# Patient Record
Sex: Male | Born: 1935 | Race: White | Hispanic: No | Marital: Married | Smoking: Former smoker
Health system: Southern US, Community
[De-identification: ages and names within clinical notes are randomized; demographics above are authoritative.]

## PROBLEM LIST (undated history)

## (undated) DIAGNOSIS — B379 Candidiasis, unspecified: Secondary | ICD-10-CM

## (undated) DIAGNOSIS — Z950 Presence of cardiac pacemaker: Secondary | ICD-10-CM

## (undated) DIAGNOSIS — R51 Headache: Secondary | ICD-10-CM

## (undated) DIAGNOSIS — R7303 Prediabetes: Secondary | ICD-10-CM

## (undated) DIAGNOSIS — D649 Anemia, unspecified: Secondary | ICD-10-CM

## (undated) DIAGNOSIS — L409 Psoriasis, unspecified: Secondary | ICD-10-CM

## (undated) DIAGNOSIS — R42 Dizziness and giddiness: Secondary | ICD-10-CM

## (undated) DIAGNOSIS — I10 Essential (primary) hypertension: Secondary | ICD-10-CM

## (undated) DIAGNOSIS — R0600 Dyspnea, unspecified: Secondary | ICD-10-CM

## (undated) DIAGNOSIS — G2581 Restless legs syndrome: Secondary | ICD-10-CM

## (undated) DIAGNOSIS — Z8719 Personal history of other diseases of the digestive system: Secondary | ICD-10-CM

## (undated) DIAGNOSIS — M1990 Unspecified osteoarthritis, unspecified site: Secondary | ICD-10-CM

## (undated) DIAGNOSIS — K219 Gastro-esophageal reflux disease without esophagitis: Secondary | ICD-10-CM

## (undated) DIAGNOSIS — I2510 Atherosclerotic heart disease of native coronary artery without angina pectoris: Secondary | ICD-10-CM

## (undated) DIAGNOSIS — J189 Pneumonia, unspecified organism: Secondary | ICD-10-CM

## (undated) DIAGNOSIS — R519 Headache, unspecified: Secondary | ICD-10-CM

## (undated) DIAGNOSIS — J449 Chronic obstructive pulmonary disease, unspecified: Secondary | ICD-10-CM

## (undated) DIAGNOSIS — Z87442 Personal history of urinary calculi: Secondary | ICD-10-CM

## (undated) DIAGNOSIS — I499 Cardiac arrhythmia, unspecified: Secondary | ICD-10-CM

## (undated) DIAGNOSIS — N400 Enlarged prostate without lower urinary tract symptoms: Secondary | ICD-10-CM

## (undated) DIAGNOSIS — F419 Anxiety disorder, unspecified: Secondary | ICD-10-CM

## (undated) DIAGNOSIS — T884XXA Failed or difficult intubation, initial encounter: Secondary | ICD-10-CM

## (undated) DIAGNOSIS — N189 Chronic kidney disease, unspecified: Secondary | ICD-10-CM

## (undated) HISTORY — PX: INSERT / REPLACE / REMOVE PACEMAKER: SUR710

## (undated) HISTORY — PX: TRANSURETHRAL RESECTION OF PROSTATE: SHX73

## (undated) HISTORY — PX: HERNIA REPAIR: SHX51

---
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
## 1990-08-05 DIAGNOSIS — R42 Dizziness and giddiness: Secondary | ICD-10-CM

## 1990-08-05 HISTORY — DX: Dizziness and giddiness: R42

## 1999-01-04 ENCOUNTER — Ambulatory Visit (HOSPITAL_COMMUNITY): Admission: RE | Admit: 1999-01-04 | Discharge: 1999-01-04 | Payer: Self-pay | Admitting: Neurosurgery

## 1999-01-04 ENCOUNTER — Encounter: Payer: Self-pay | Admitting: Neurosurgery

## 2005-06-04 ENCOUNTER — Ambulatory Visit: Payer: Self-pay | Admitting: Internal Medicine

## 2005-07-23 ENCOUNTER — Other Ambulatory Visit: Payer: Self-pay

## 2005-07-23 ENCOUNTER — Ambulatory Visit: Payer: Self-pay | Admitting: Urology

## 2005-07-31 ENCOUNTER — Ambulatory Visit: Payer: Self-pay | Admitting: Urology

## 2006-08-13 ENCOUNTER — Other Ambulatory Visit: Payer: Self-pay

## 2006-08-13 ENCOUNTER — Ambulatory Visit: Payer: Self-pay | Admitting: Urology

## 2006-08-20 ENCOUNTER — Ambulatory Visit: Payer: Self-pay | Admitting: Urology

## 2006-08-20 ENCOUNTER — Emergency Department: Payer: Self-pay | Admitting: Unknown Physician Specialty

## 2006-08-24 ENCOUNTER — Emergency Department: Payer: Self-pay

## 2006-09-10 ENCOUNTER — Ambulatory Visit: Payer: Self-pay | Admitting: Urology

## 2006-09-10 ENCOUNTER — Other Ambulatory Visit: Payer: Self-pay

## 2007-11-05 ENCOUNTER — Ambulatory Visit: Payer: Self-pay | Admitting: Unknown Physician Specialty

## 2008-11-08 ENCOUNTER — Ambulatory Visit: Payer: Self-pay | Admitting: Urology

## 2008-12-05 ENCOUNTER — Ambulatory Visit: Payer: Self-pay | Admitting: Urology

## 2009-06-13 ENCOUNTER — Ambulatory Visit: Payer: Self-pay | Admitting: Urology

## 2009-10-06 ENCOUNTER — Ambulatory Visit: Payer: Self-pay | Admitting: Unknown Physician Specialty

## 2010-08-05 HISTORY — PX: JOINT REPLACEMENT: SHX530

## 2010-08-05 HISTORY — PX: PARTIAL KNEE ARTHROPLASTY: SHX2174

## 2011-06-19 ENCOUNTER — Ambulatory Visit: Payer: Self-pay | Admitting: Unknown Physician Specialty

## 2011-07-29 ENCOUNTER — Ambulatory Visit: Payer: Self-pay | Admitting: Unknown Physician Specialty

## 2012-12-11 ENCOUNTER — Emergency Department: Payer: Self-pay | Admitting: Unknown Physician Specialty

## 2012-12-22 ENCOUNTER — Ambulatory Visit: Payer: Self-pay | Admitting: Cardiology

## 2012-12-22 LAB — PROTIME-INR
INR: 1.1
Prothrombin Time: 14.2 secs (ref 11.5–14.7)

## 2013-10-29 DIAGNOSIS — N50819 Testicular pain, unspecified: Secondary | ICD-10-CM | POA: Insufficient documentation

## 2013-11-02 ENCOUNTER — Ambulatory Visit: Payer: Self-pay | Admitting: Urology

## 2014-08-05 DIAGNOSIS — I499 Cardiac arrhythmia, unspecified: Secondary | ICD-10-CM

## 2014-08-05 HISTORY — DX: Cardiac arrhythmia, unspecified: I49.9

## 2014-11-08 DIAGNOSIS — D649 Anemia, unspecified: Secondary | ICD-10-CM | POA: Insufficient documentation

## 2014-12-05 ENCOUNTER — Other Ambulatory Visit: Payer: Self-pay | Admitting: Cardiology

## 2014-12-13 ENCOUNTER — Ambulatory Visit: Admit: 2014-12-13 | Payer: Self-pay | Admitting: Cardiology

## 2014-12-13 SURGERY — CARDIOVERSION (CATH LAB)
Anesthesia: Moderate Sedation

## 2014-12-22 ENCOUNTER — Encounter: Payer: Self-pay | Admitting: Cardiology

## 2014-12-22 ENCOUNTER — Ambulatory Visit: Payer: Medicare Other | Admitting: Anesthesiology

## 2014-12-22 ENCOUNTER — Ambulatory Visit
Admission: RE | Admit: 2014-12-22 | Discharge: 2014-12-22 | Disposition: A | Discharge status: Home / Self Care | Payer: Medicare Other | Source: Ambulatory Visit | Attending: Cardiology | Admitting: Cardiology

## 2014-12-22 ENCOUNTER — Encounter
Admission: RE | Disposition: A | Discharge status: Home / Self Care | Payer: Self-pay | Source: Ambulatory Visit | Attending: Cardiology

## 2014-12-22 DIAGNOSIS — N400 Enlarged prostate without lower urinary tract symptoms: Secondary | ICD-10-CM | POA: Insufficient documentation

## 2014-12-22 DIAGNOSIS — K219 Gastro-esophageal reflux disease without esophagitis: Secondary | ICD-10-CM | POA: Insufficient documentation

## 2014-12-22 DIAGNOSIS — R5383 Other fatigue: Secondary | ICD-10-CM | POA: Diagnosis not present

## 2014-12-22 DIAGNOSIS — I480 Paroxysmal atrial fibrillation: Secondary | ICD-10-CM | POA: Diagnosis not present

## 2014-12-22 DIAGNOSIS — Z825 Family history of asthma and other chronic lower respiratory diseases: Secondary | ICD-10-CM | POA: Insufficient documentation

## 2014-12-22 DIAGNOSIS — Z8249 Family history of ischemic heart disease and other diseases of the circulatory system: Secondary | ICD-10-CM | POA: Diagnosis not present

## 2014-12-22 DIAGNOSIS — I2510 Atherosclerotic heart disease of native coronary artery without angina pectoris: Secondary | ICD-10-CM | POA: Insufficient documentation

## 2014-12-22 DIAGNOSIS — Z8489 Family history of other specified conditions: Secondary | ICD-10-CM | POA: Diagnosis not present

## 2014-12-22 DIAGNOSIS — I4891 Unspecified atrial fibrillation: Secondary | ICD-10-CM | POA: Insufficient documentation

## 2014-12-22 DIAGNOSIS — M1990 Unspecified osteoarthritis, unspecified site: Secondary | ICD-10-CM | POA: Insufficient documentation

## 2014-12-22 DIAGNOSIS — Z87891 Personal history of nicotine dependence: Secondary | ICD-10-CM | POA: Insufficient documentation

## 2014-12-22 DIAGNOSIS — I10 Essential (primary) hypertension: Secondary | ICD-10-CM | POA: Diagnosis not present

## 2014-12-22 HISTORY — DX: Unspecified osteoarthritis, unspecified site: M19.90

## 2014-12-22 HISTORY — DX: Atherosclerotic heart disease of native coronary artery without angina pectoris: I25.10

## 2014-12-22 HISTORY — DX: Benign prostatic hyperplasia without lower urinary tract symptoms: N40.0

## 2014-12-22 HISTORY — PX: ELECTROPHYSIOLOGIC STUDY: SHX172A

## 2014-12-22 HISTORY — DX: Gastro-esophageal reflux disease without esophagitis: K21.9

## 2014-12-22 HISTORY — DX: Anemia, unspecified: D64.9

## 2014-12-22 HISTORY — DX: Essential (primary) hypertension: I10

## 2014-12-22 HISTORY — DX: Cardiac arrhythmia, unspecified: I49.9

## 2014-12-22 SURGERY — CARDIOVERSION (CATH LAB)
Anesthesia: General

## 2014-12-22 MED ORDER — PROPOFOL 10 MG/ML IV BOLUS
INTRAVENOUS | Status: DC | PRN
  Administered 2014-12-22: 60 mg via INTRAVENOUS

## 2014-12-22 NOTE — Anesthesia Postprocedure Evaluation (Signed)
  Anesthesia Post-op Note  Patient: Joseph Houston  Procedure(s) Performed: Procedure(s): CARDIOVERSION (N/A)  Anesthesia type:General  Patient location: PACU  Post pain: Pain level controlled  Post assessment: Post-op Vital signs reviewed, Patient's Cardiovascular Status Stable, Respiratory Function Stable, Patent Airway and No signs of Nausea or vomiting  Post vital signs: Reviewed and stable  Last Vitals:  Filed Vitals:   12/22/14 0737  BP: 131/74  Pulse: 60  Temp: 36.1 C  Resp: 12    Level of consciousness: awake, alert  and patient cooperative  Complications: No apparent anesthesia complications

## 2014-12-22 NOTE — OR Nursing (Signed)
Pt in [facility] for Cardioversion,outpatient. Anesthesia present for case  And transfer of care for the procedure started at: 725. All VS and  Assessment during procedure refer to anesthesia record. Care transfer back to [facility] at:0728

## 2014-12-22 NOTE — Anesthesia Procedure Notes (Signed)
Performed by: Aline Brochure Pre-anesthesia Checklist: Timeout performed Oxygen Delivery Method: Nasal cannula

## 2014-12-22 NOTE — H&P (Signed)
Chief Complaint: Chief Complaint  Patient presents with  . Follow-up  2 weeks  . Fatigue  Im just so tired I cant hardly do anything  Date of Service: 12/02/2014 Date of Birth: [birth year] PCP: Azzie Glatter, MD  History of Present Illness: Mr. Bink is a 79 y.o.male patient who returns for follow-up visit. Has had some weakness and fatigue. He had been treated for atrial fibrillation with flecainide at 100 mg twice daily as well as beta-blocker. His rate is being controlled. He is hemodynamically stable at present. He complains of generalize weakness and fatigue over the last 2 weeks. Holter monitor reveals atrial fibrillation with controlled rate. He is very symptomatic. Will consider proceeding with elective cardioversion after 4 weeks of anticoagulation with apixaban. Past Medical and Surgical History  Past Medical History Past Medical History  Diagnosis Date  . BPH (benign prostatic hypertrophy)  . CAD (coronary artery disease)  . Normal cardiac stress test 08/2011  . Atrial fibrillation  . GERD (gastroesophageal reflux disease) 2000  . Hypertension  . 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   Medications and Allergies  Current Medications  Current Outpatient Prescriptions  Medication Sig Dispense Refill  . acetaminophen (TYLENOL) 325 MG tablet Take 650 mg by mouth every 4 (four) hours as needed for Pain.  [name] apixaban (ELIQUIS) 5 mg tablet Take 1 tablet (5 mg total) by mouth 2 (two) times daily. 60 tablet 11  . aspirin 81 MG chewable tablet Take 81 mg by mouth once daily.  [name] azelastine (ASTEPRO) 0.15 % (205.5 mcg) nasal spray Place 2 sprays into both nostrils 2 (two) times daily as needed for Rhinitis.  .  cyanocobalamin (VITAMIN B12) 1000 MCG tablet Take 1,000 mcg by mouth once daily.  [name] EPINEPHrine (EPIPEN 2-PAK) 0.3 mg/0.3 mL pen injector  . flecainide (TAMBOCOR) 100 MG tablet Take 1 tablet (100 mg total) by mouth 2 (two) times daily. 60 tablet 5  . fluticasone (FLONASE) 50 mcg/actuation nasal spray Place 2 sprays into both nostrils once daily.  [name] losartan (COZAAR) 100 MG tablet Take 1 tablet (100 mg total) by mouth once daily. 30 tablet 5  . metoprolol succinate (TOPROL-XL) 25 MG XL tablet Take 1 tablet (25 mg total) by mouth once daily. 30 tablet 5  . omega-3 fatty acids-vitamin E (FISH OIL) 1,000 mg Take 3 capsules by mouth once daily.  [name] omega-3 fatty acids/fish oil 340-1,000 mg capsule Take 1 capsule by mouth 2 (two) times daily.  [name] omeprazole (PRILOSEC) 20 MG DR capsule Take 1 capsule (20 mg total) by mouth 2 (two) times daily. 60 capsule 5  . predniSONE (DELTASONE) 10 MG tablet Take 10 mg by mouth once daily.  . traMADol (ULTRAM) 50 mg tablet 1 Tab po bid 60 tablet 2   No current facility-administered medications for this visit.   Allergies: Codeine sulfate  Social and Family History  Social History reports that he quit smoking about 49 years ago. He has never used smokeless tobacco. He reports that he does not drink alcohol.  Family History Family History  Problem Relation Age of Onset  . Alzheimer's disease Mother  . Heart attack Brother  . Allergies Other  Sibling  . Asthma Other  Sibling  . No Known Problems Father   Review of Systems  Review of Systems  Constitutional: Positive for malaise/fatigue. Negative for fever, chills, weight loss and diaphoresis.  HENT: Negative for congestion, ear discharge, hearing loss and tinnitus.  Eyes: Negative for blurred vision.  Respiratory: Negative for cough, hemoptysis, sputum production, shortness of breath and wheezing.  Cardiovascular: Negative for chest pain, palpitations, orthopnea, claudication, leg swelling and PND.   Gastrointestinal: Negative for heartburn, nausea, vomiting, abdominal pain, diarrhea, constipation, blood in stool and melena.  Genitourinary: Negative for dysuria, urgency, frequency and hematuria.  Musculoskeletal: Negative for myalgias, back pain, joint pain and falls.  Skin: Negative for itching and rash.  Neurological: Positive for weakness. Negative for dizziness, tingling, focal weakness, loss of consciousness and headaches.  Endo/Heme/Allergies: Negative for polydipsia. Does not bruise/bleed easily.  Psychiatric/Behavioral: Negative for depression, memory loss and substance abuse. The patient is not nervous/anxious.    Physical Examination   Vitals:BP 132/80 mmHg  Pulse 62  Resp 10  Ht 180.3 cm (5\' 11" )  Wt 117.482 kg (259 lb)  BMI 36.14 kg/m2 Ht:180.3 cm (5\' 11" ) Wt:117.482 kg (259 lb) UGQ:BVQX surface area is 2.43 meters squared. Body mass index is 36.14 kg/(m^2).  Wt Readings from Last 3 Encounters:  12/02/14 117.482 kg (259 lb)  11/18/14 118.752 kg (261 lb 12.8 oz)  11/08/14 119.296 kg (263 lb)   BP Readings from Last 3 Encounters:  12/02/14 132/80  11/18/14 134/80  11/08/14 144/90   General appearance appears in no acute distress  Head Mouth and Eye exam Normocephalic, without obvious abnormality, atraumatic Dentition is good Eyes appear anicteric   Neck exam Thyroid: normal  Nodes: no obvious adenopathy  LUNGS Breath Sounds: Normal Percussion: Normal  CARDIOVASCULAR JVP CV wave: no HJR: no Elevation at 90 degrees: None Carotid Pulse: normal pulsation bilaterally Bruit: None Apex: apical impulse normal  Auscultation Rhythm: atrial fibrillation S1: normal S2: normal Clicks: no Rub: no Murmurs: no murmurs  Gallop: None ABDOMEN Liver enlargement: no Pulsatile aorta: no Ascites: no Bruits: no  EXTREMITIES Clubbing: no Edema: 1+ bilateral pedal edema Pulses: peripheral pulses symmetrical Femoral Bruits: no Amputation:  no SKIN Rash: no Cyanosis: no Embolic phemonenon: no Bruising: no NEURO Alert and Oriented to person, place and time: yes Non focal: yes  PSYCH: Pt appears to have normal affect  LABS REVIEWED Last 3 CBC results: Lab Results  Component Value Date  WBC 6.9 11/08/2014  WBC 8.5 04/25/2014   Lab Results  Component Value Date  HGB 14.5 11/08/2014  HGB 12.7* 04/25/2014   Lab Results  Component Value Date  HCT 44.9 11/08/2014  HCT 39.0* 04/25/2014   Lab Results  Component Value Date  PLT 192 11/08/2014  PLT 205 04/25/2014   Lab Results  Component Value Date  CREATININE 1.3 12/02/2014  BUN 26* 12/02/2014  NA 140 12/02/2014  K 5.2* 12/02/2014  CL 104 12/02/2014  CO2 29.1 12/02/2014   Lab Results  Component Value Date  HGBA1C 6.6* 11/08/2014   Lab Results  Component Value Date  HDL 40.3 04/25/2014   Lab Results  Component Value Date  LDLCALC 100 04/25/2014   Lab Results  Component Value Date  TRIG 81 04/25/2014   Lab Results  Component Value Date  ALT 18 11/08/2014  AST 18 11/08/2014  ALKPHOS 59 11/08/2014   Lab Results  Component Value Date  TSH 0.894 11/08/2014   Diagnostic Studies Reviewed:  EKG EKG demonstrated nonspecific ST and T waves changes, atrial fibrillation, rate 88.  Assessment and Plan   79 y.o. male with  ICD-10-CM  ICD-9-CM  1. Paroxysmal atrial fibrillation-atrial fibrillation has recurred. Will remain on flecainide and metoprolol. Will consider cardioversion after 4 weeks of adequate anticoagulation with apixaban. Risk and benefits of this procedure were discussed I48.0 427.31     2. Essential hypertension, hypertension with unspecified goal-blood pressure is stable with losartan, metoprolol. I10 401.9  3. Gastroesophageal reflux disease, esophagitis presence not specified K21.9 530.81   Return in about 4 weeks (around 12/30/2014).  These notes generated with voice recognition software. I apologize for typographical  errors.  Sydnee Levans, MD

## 2014-12-22 NOTE — Anesthesia Preprocedure Evaluation (Signed)
Anesthesia Evaluation  Patient identified by MRN, date of birth, ID band Patient awake    Reviewed: Allergy & Precautions, NPO status   History of Anesthesia Complications Negative for: history of anesthetic complications  Airway Mallampati: II       Dental  (+) Edentulous Upper, Edentulous Lower   Pulmonary former smoker,    Pulmonary exam normal       Cardiovascular Exercise Tolerance: Poor hypertension, + dysrhythmias Atrial Fibrillation Rhythm:Irregular     Neuro/Psych negative neurological ROS     GI/Hepatic Neg liver ROS, hiatal hernia,   Endo/Other  negative endocrine ROS  Renal/GU negative Renal ROS  negative genitourinary   Musculoskeletal negative musculoskeletal ROS (+)   Abdominal (+) + obese,  Abdomen: soft.    Peds negative pediatric ROS (+)  Hematology negative hematology ROS (+)   Anesthesia Other Findings   Reproductive/Obstetrics negative OB ROS                             Anesthesia Physical Anesthesia Plan  ASA: III  Anesthesia Plan: General   Post-op Pain Management:    Induction: Intravenous  Airway Management Planned: Nasal Cannula  Additional Equipment:   Intra-op Plan:   Post-operative Plan:   Informed Consent: I have reviewed the patients History and Physical, chart, labs and discussed the procedure including the risks, benefits and alternatives for the proposed anesthesia with the patient or authorized representative who has indicated his/her understanding and acceptance.     Plan Discussed with: CRNA and Surgeon  Anesthesia Plan Comments:         Anesthesia Quick Evaluation

## 2014-12-22 NOTE — Procedures (Signed)
Electrical Cardioversion Procedure Note Joseph Houston [id number] 05-17-1936  Procedure: Electrical Cardioversion Indications:  Atrial Fibrillation  Procedure Details Consent: Risks of procedure as well as the alternatives and risks of each were explained to the (patient/caregiver).  Consent for procedure obtained. Time Out: Verified patient identification, verified procedure, site/side was marked, verified correct patient position, special equipment/implants available, medications/allergies/relevent history reviewed, required imaging and test results available.  Performed  Patient placed on cardiac monitor, pulse oximetry, supplemental oxygen as necessary.  Sedation given: propofol Pacer pads placed anterior and posterior chest.  Cardioverted 2 time(s).  Cardioverted at 120J, 200J Evaluation Findings: Post procedure EKG shows: NSR Complications: None Patient did tolerate procedure well.   Joseph Houston A. 12/22/2014, 7:32 AM

## 2014-12-22 NOTE — Transfer of Care (Signed)
Immediate Anesthesia Transfer of Care Note  Patient: Joseph Houston  Procedure(s) Performed: Procedure(s): CARDIOVERSION (N/A)  Patient Location: PACU  Anesthesia Type:General  Level of Consciousness: awake, alert  and oriented  Airway & Oxygen Therapy: Patient Spontanous Breathing and Patient connected to nasal cannula oxygen  Post-op Assessment: Report given to RN and Post -op Vital signs reviewed and stable  Post vital signs: stable  Last Vitals:  Filed Vitals:   12/22/14 0737  BP: 131/74  Pulse: 60  Temp: 36.1 C  Resp: 12    Complications: No apparent anesthesia complications

## 2015-04-14 ENCOUNTER — Encounter: Payer: Self-pay | Admitting: Cardiology

## 2015-04-19 ENCOUNTER — Other Ambulatory Visit: Payer: Self-pay | Admitting: Cardiology

## 2015-04-20 DIAGNOSIS — H9193 Unspecified hearing loss, bilateral: Secondary | ICD-10-CM | POA: Insufficient documentation

## 2015-05-10 ENCOUNTER — Encounter: Payer: Self-pay | Admitting: *Deleted

## 2015-05-10 ENCOUNTER — Emergency Department
Admission: EM | Admit: 2015-05-10 | Discharge: 2015-05-10 | Disposition: A | Discharge status: Home / Self Care | Payer: Worker's Compensation | Attending: Emergency Medicine | Admitting: Emergency Medicine

## 2015-05-10 ENCOUNTER — Emergency Department: Payer: Worker's Compensation

## 2015-05-10 DIAGNOSIS — Y990 Civilian activity done for income or pay: Secondary | ICD-10-CM | POA: Diagnosis not present

## 2015-05-10 DIAGNOSIS — Z87891 Personal history of nicotine dependence: Secondary | ICD-10-CM | POA: Diagnosis not present

## 2015-05-10 DIAGNOSIS — Y9389 Activity, other specified: Secondary | ICD-10-CM | POA: Insufficient documentation

## 2015-05-10 DIAGNOSIS — Z7952 Long term (current) use of systemic steroids: Secondary | ICD-10-CM | POA: Insufficient documentation

## 2015-05-10 DIAGNOSIS — W01198A Fall on same level from slipping, tripping and stumbling with subsequent striking against other object, initial encounter: Secondary | ICD-10-CM | POA: Insufficient documentation

## 2015-05-10 DIAGNOSIS — S79921A Unspecified injury of right thigh, initial encounter: Secondary | ICD-10-CM | POA: Diagnosis present

## 2015-05-10 DIAGNOSIS — Y9289 Other specified places as the place of occurrence of the external cause: Secondary | ICD-10-CM | POA: Diagnosis not present

## 2015-05-10 DIAGNOSIS — Z7951 Long term (current) use of inhaled steroids: Secondary | ICD-10-CM | POA: Diagnosis not present

## 2015-05-10 DIAGNOSIS — I10 Essential (primary) hypertension: Secondary | ICD-10-CM | POA: Insufficient documentation

## 2015-05-10 DIAGNOSIS — S20211A Contusion of right front wall of thorax, initial encounter: Secondary | ICD-10-CM | POA: Insufficient documentation

## 2015-05-10 DIAGNOSIS — W19XXXA Unspecified fall, initial encounter: Secondary | ICD-10-CM

## 2015-05-10 DIAGNOSIS — Z79899 Other long term (current) drug therapy: Secondary | ICD-10-CM | POA: Insufficient documentation

## 2015-05-10 DIAGNOSIS — S7011XA Contusion of right thigh, initial encounter: Secondary | ICD-10-CM | POA: Insufficient documentation

## 2015-05-10 DIAGNOSIS — Z7982 Long term (current) use of aspirin: Secondary | ICD-10-CM | POA: Insufficient documentation

## 2015-05-10 MED ORDER — CYCLOBENZAPRINE HCL 5 MG PO TABS: 5.0000 mg | ORAL_TABLET | Freq: Three times a day (TID) | ORAL | Status: DC | PRN

## 2015-05-10 MED ORDER — HYDROCODONE-ACETAMINOPHEN 5-325 MG PO TABS
1.0000 | ORAL_TABLET | Freq: Once | ORAL | Status: AC
  Administered 2015-05-10: 1 via ORAL
  Filled 2015-05-10: qty 1

## 2015-05-10 MED ORDER — TRAMADOL HCL 50 MG PO TABS: 50.0000 mg | ORAL_TABLET | Freq: Two times a day (BID) | ORAL | Status: DC

## 2015-05-10 NOTE — ED Notes (Signed)
Pt was opening a door and the rug was rolled up. He tripped, and fell. He c/o pain in the right thigh and right ribs.

## 2015-05-10 NOTE — ED Provider Notes (Signed)
[facility] Emergency Department Provider Note ____________________________________________  Time seen: 2255  I have reviewed the triage vital signs and the nursing notes.  HISTORY  Chief Complaint  Leg Pain  HPI Joseph Houston is a 79 y.o. male reports to the ED today, escorted by his wife, for evaluation injury sustained following a work related fall today. 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 As he walked inside the school, he inadvertently tripped over a rug at the door that had been rolled up. This caused him to fall forward landing on his thighs, and his flexed forearms. He reports pain to the right anterior thigh, as well as pain to the right side of the chest due to his elbow being pressed against him as he fell. He did not hit his head, denies any nausea, vomiting, or dizziness.He initially reported his pain at a 0/10 in triage.  His primary pain complaint is to the right thigh. He has a partial replacement on the right knee.  Past Medical History  Diagnosis Date  . Hypertension   . GERD (gastroesophageal reflux disease)   . Coronary artery disease   . Arthritis   . Anemia   . Dysrhythmia   . BPH (benign prostatic hypertrophy)     Patient Active Problem List   Diagnosis Date Noted  . A-fib ([place]) 12/22/2014  . 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 Acid reflux 12/22/2014  . BP (high blood pressure) 12/22/2014  . Chronic anemia 11/08/2014    Past Surgical History  Procedure Laterality Date  . Hernia repair    . Transurethral resection of prostate    . Electrophysiologic study N/A 12/22/2014    Procedure: CARDIOVERSION;  Surgeon: Teodoro Spray, MD;  Location: [facility] ORS;  Service: Cardiovascular;  Laterality: N/A;    Current Outpatient Rx  Name  Route  Sig  Dispense  Refill  . acetaminophen (TYLENOL) 325 MG tablet   Oral   Take  650 mg by mouth every 4 (four) hours as needed for mild pain.         [name] apixaban (ELIQUIS) 5 MG TABS tablet   Oral   Take 5 mg by mouth 2 (two) times daily.         [name] aspirin 81 MG tablet   Oral   Take 81 mg by mouth daily.         [name] azelastine (ASTELIN) 0.1 % nasal spray   Each Nare   Place 2 sprays into both nostrils 2 (two) times daily. Use in each nostril as directed PRN         . cyanocobalamin 1000 MCG tablet   Oral   Take 100 mcg by mouth daily.         . cyclobenzaprine (FLEXERIL) 5 MG tablet   Oral   Take 1 tablet (5 mg total) by mouth every 8 (eight) hours as needed for muscle spasms.   12 tablet   0   . EPINEPHrine 0.3 mg/0.3 mL IJ SOAJ injection   Intramuscular   Inject 0.3 mg into the muscle once. As needed         . flecainide (TAMBOCOR) 100 MG tablet   Oral   Take 100 mg by mouth 2 (two) times daily.         . fluticasone (FLONASE) 50 MCG/ACT nasal spray   Each  Nare   Place 2 sprays into both nostrils daily. PRN         . losartan (COZAAR) 100 MG tablet   Oral   Take 100 mg by mouth daily. At bedtime         . metoprolol succinate (TOPROL-XL) 25 MG 24 hr tablet   Oral   Take 25 mg by mouth daily. At bedtime         . omega-3 acid ethyl esters (LOVAZA) 1 G capsule   Oral   Take by mouth 2 (two) times daily.         [name] omeprazole (PRILOSEC) 20 MG capsule   Oral   Take 20 mg by mouth 2 (two) times daily before a meal.         . predniSONE (DELTASONE) 10 MG tablet   Oral   Take 10 mg by mouth daily with breakfast. Takes in evening         . traMADol (ULTRAM) 50 MG tablet   Oral   Take by mouth 2 (two) times daily.         . traMADol (ULTRAM) 50 MG tablet   Oral   Take 1 tablet (50 mg total) by mouth 2 (two) times daily.   10 tablet   0    Allergies Codeine  No family history on file.  Social History Social History  Substance Use Topics  . Smoking status: Former Smoker -- 2.00 packs/day    Types:  Cigarettes    Quit date: 12/21/1965  . Smokeless tobacco: None  . Alcohol Use: No   Review of Systems  Constitutional: Negative for fever. Eyes: Negative for visual changes. ENT: Negative for sore throat. Cardiovascular: Negative for chest pain. Respiratory: Negative for shortness of breath. Gastrointestinal: Negative for abdominal pain, vomiting and diarrhea. Genitourinary: Negative for dysuria. Musculoskeletal: Negative for back pain. Right thigh pain as above. Skin: Negative for rash. Neurological: Negative for headaches, focal weakness or numbness. ____________________________________________  PHYSICAL EXAM:  VITAL SIGNS: ED Triage Vitals  Enc Vitals Group     BP 05/10/15 2143 183/93 mmHg     Pulse Rate 05/10/15 2143 73     Resp 05/10/15 2143 18     Temp 05/10/15 2143 98.2 F (36.8 C)     Temp Source 05/10/15 2143 Oral     SpO2 05/10/15 2143 94 %     Weight --      Height --      Head Cir --      Peak Flow --      Pain Score 05/10/15 2143 0     Pain Loc --      Pain Edu? --      Excl. in [place]? --    Constitutional: Alert and oriented. Well appearing and in no distress. Eyes: Conjunctivae are normal. PERRL. Normal extraocular movements. ENT   Head: Normocephalic and atraumatic.   Nose: No congestion/rhinorrhea.   Mouth/Throat: Mucous membranes are moist.   Neck: Supple. No thyromegaly. Hematological/Lymphatic/Immunological: No cervical lymphadenopathy. Cardiovascular: Normal rate, regular rhythm.  Respiratory: Normal respiratory effort. No wheezes/rales/rhonchi. Gastrointestinal: Soft and nontender. No distention. Musculoskeletal: Right thigh without obvious deformity, abrasion, or erythema. He does have a moderately-sized early hematoma developing to the anterior lateral thigh. His knee exam is unremarkable, with no joint laxity appreciated. He is without any calf or Achilles tenderness on exam. Nontender with normal range of motion in all extremities.   Neurologic:  Normal gait without ataxia. Normal speech and  language. No gross focal neurologic deficits are appreciated. Skin:  Skin is warm, dry and intact. No rash noted. He has a small abrasion noted to the right elbow flexor surface, with underlying psoriatic skin changes appreciated. Psychiatric: Mood and affect are normal. Patient exhibits appropriate insight and judgment. ____________________________________________   RADIOLOGY  Right Femur IMPRESSION: Negative.  I, Tzipporah Nagorski, Dannielle Karvonen, personally viewed and evaluated these images (plain radiographs) as part of my medical decision making.  ____________________________________________  PROCEDURES  Norco 5-325 mg PO ____________________________________________  INITIAL IMPRESSION / ASSESSMENT AND PLAN / ED COURSE  Patient with injuries sustained following a ground-level fall. No evidence of fracture or radiographic exam of the right leg. Patient will be discharged with instructions for management of a thigh contusion, chest wall contusion, and elbow abrasion. He will be discharged with prescriptions for Ultram and Flexeril dose as needed. He will be provided with a work note for out of work 2 days as requested. He'll follow-up with Dr. Ginette Pitman as needed for ongoing symptoms. ____________________________________________  FINAL CLINICAL IMPRESSION(S) / ED DIAGNOSES  Final diagnoses:  Fall, initial encounter  Thigh contusion, right, initial encounter  Chest wall contusion, right, initial encounter      Melvenia Needles, PA-C 05/10/15 2348  Ahmed Prima, MD 05/10/15 2356

## 2015-05-10 NOTE — ED Notes (Signed)
Patient transported to X-ray 

## 2015-05-10 NOTE — Discharge Instructions (Signed)
Chest Contusion A contusion is a deep bruise. Bruises happen when an injury causes bleeding under the skin. Signs of bruising include pain, puffiness (swelling), and discolored skin. The bruise may turn blue, purple, or yellow.  HOME CARE  Put ice on the injured area.  Put ice in a plastic bag.  Place a towel between the skin and the bag.  Leave the ice on for 15-20 minutes at a time, 03-04 times a day for the first 48 hours.  Only take medicine as told by your doctor.  Rest.  Take deep breaths (deep-breathing exercises) as told by your doctor.  Stop smoking if you smoke.  Do not lift objects over 5 pounds (2.3 kilograms) for 3 days or longer if told by your doctor. GET HELP RIGHT AWAY IF:   You have more bruising or puffiness.  You have pain that gets worse.  You have trouble breathing.  You are dizzy, weak, or pass out (faint).  You have blood in your pee (urine) or poop (stool).  You cough up or throw up (vomit) blood.  Your puffiness or pain is not helped with medicines. MAKE SURE YOU:   Understand these instructions.  Will watch your condition.  Will get help right away if you are not doing well or get worse.   This information is not intended to replace advice given to you by your health care provider. Make sure you discuss any questions you have with your health care provider.   Document Released: 01/08/2008 Document Revised: 04/15/2012 Document Reviewed: 01/13/2012 Elsevier Interactive Patient Education 2016 Elsevier Inc[employer]  Quadriceps Contusion A quadriceps contusion is a deep bruise of the large muscle in the front of your thigh. Contusions are the result of an injury that caused bleeding under the skin. The contusion may turn blue, purple, or yellow. Minor injuries will give you a painless contusion, but more severe contusions may stay painful and swollen for a few weeks. It is necessary to follow your caregiver's directions when this muscle is bruised.    CAUSES A quadriceps contusion comes from a blow or injury to the front of the leg. SYMPTOMS   Swelling and redness of the thigh area.  Bruising of the thigh area.  Tenderness or soreness of the thigh.  Limping.  Leg stiffness.  Difficulty bending the leg.  Trouble walking. DIAGNOSIS  You will have a physical exam and will be asked about your history. You may need an X-ray of your leg. TREATMENT  Often, the best treatment for a quadriceps contusion is resting and elevating the leg and applying cold compresses to the thigh area. Over-the-counter medicines may also be recommended for pain control. You may need crutches, an elastic wrap, or a leg splint.  HOME CARE INSTRUCTIONS   Put ice on the injured area.  Put ice in a plastic bag.  Place a towel between your skin and the bag.  Leave the ice on for 15-20 minutes, 03-04 times a day.  Only take over-the-counter or prescription medicines for pain, discomfort, or fever as directed by your caregiver.  Rest the injured thigh until the pain and swelling are better.  Elevate your leg to reduce swelling. Lie down flat on your back and place a pillow under your knee.  Apply compression wraps as directed by your caregiver. You may remove it for sleeping, showers, and baths. If your toes become numb, cold, or blue, take the wrap off and reapply it more loosely.  Walk or move around as  the pain allows, or as directed by your caregiver. Resume full activities only when your caregiver says it is okay. Returning to your usual activities before your caregiver approves may cause worse damage to the muscle.  See your caregiver as directed. It is very important to keep all follow-up referrals and appointments in order to avoid any long-term problems with your leg, including chronic pain or inability to move your leg normally. SEEK MEDICAL CARE IF:   You have increased bruising or swelling.  You have pain that is getting worse.  Your  swelling or pain is not relieved by medicines.  Your toes or foot become cold or turn bluish in color.  You notice your thigh getting larger in size. MAKE SURE YOU:   Understand these instructions.  Will watch your condition.  Will get help right away if you are not doing well or get worse.   This information is not intended to replace advice given to you by your health care provider. Make sure you discuss any questions you have with your health care provider.   Document Released: 04/16/2001 Document Revised: 08/12/2014 Document Reviewed: 12/07/2014 Elsevier Interactive Patient Education 2016 [place] ice or a cool towel to the thigh for pain and swelling control.  Take the prescription meds as directed for pain relief.  Follow-up with Dr. Ginette Pitman for continued symptoms.

## 2015-08-06 DIAGNOSIS — Z950 Presence of cardiac pacemaker: Secondary | ICD-10-CM

## 2015-08-06 HISTORY — DX: Presence of cardiac pacemaker: Z95.0

## 2015-09-20 ENCOUNTER — Encounter: Payer: Self-pay | Admitting: Emergency Medicine

## 2015-09-20 ENCOUNTER — Emergency Department
Admission: EM | Admit: 2015-09-20 | Discharge: 2015-09-20 | Disposition: A | Discharge status: Home / Self Care | Payer: Medicare Other | Attending: Emergency Medicine | Admitting: Emergency Medicine

## 2015-09-20 ENCOUNTER — Emergency Department: Payer: Medicare Other

## 2015-09-20 DIAGNOSIS — Y998 Other external cause status: Secondary | ICD-10-CM | POA: Insufficient documentation

## 2015-09-20 DIAGNOSIS — Y9389 Activity, other specified: Secondary | ICD-10-CM | POA: Insufficient documentation

## 2015-09-20 DIAGNOSIS — Z87891 Personal history of nicotine dependence: Secondary | ICD-10-CM | POA: Insufficient documentation

## 2015-09-20 DIAGNOSIS — Z79899 Other long term (current) drug therapy: Secondary | ICD-10-CM | POA: Diagnosis not present

## 2015-09-20 DIAGNOSIS — E669 Obesity, unspecified: Secondary | ICD-10-CM | POA: Diagnosis not present

## 2015-09-20 DIAGNOSIS — I10 Essential (primary) hypertension: Secondary | ICD-10-CM | POA: Insufficient documentation

## 2015-09-20 DIAGNOSIS — Y92410 Unspecified street and highway as the place of occurrence of the external cause: Secondary | ICD-10-CM | POA: Insufficient documentation

## 2015-09-20 DIAGNOSIS — R609 Edema, unspecified: Secondary | ICD-10-CM | POA: Diagnosis not present

## 2015-09-20 DIAGNOSIS — Z792 Long term (current) use of antibiotics: Secondary | ICD-10-CM | POA: Diagnosis not present

## 2015-09-20 DIAGNOSIS — Z043 Encounter for examination and observation following other accident: Secondary | ICD-10-CM | POA: Insufficient documentation

## 2015-09-20 LAB — GLUCOSE, CAPILLARY: Glucose-Capillary: 117 mg/dL — ABNORMAL HIGH (ref 65–99)
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
## 2015-09-25 DIAGNOSIS — R739 Hyperglycemia, unspecified: Secondary | ICD-10-CM | POA: Insufficient documentation

## 2015-11-06 ENCOUNTER — Ambulatory Visit: Payer: Medicare Other | Attending: Internal Medicine | Admitting: Occupational Therapy

## 2015-11-06 DIAGNOSIS — M6281 Muscle weakness (generalized): Secondary | ICD-10-CM | POA: Diagnosis present

## 2015-11-06 DIAGNOSIS — M25531 Pain in right wrist: Secondary | ICD-10-CM | POA: Diagnosis present

## 2015-11-06 DIAGNOSIS — R202 Paresthesia of skin: Secondary | ICD-10-CM | POA: Diagnosis present

## 2015-11-06 DIAGNOSIS — M79642 Pain in left hand: Secondary | ICD-10-CM | POA: Diagnosis not present

## 2015-11-06 DIAGNOSIS — M79641 Pain in right hand: Secondary | ICD-10-CM | POA: Insufficient documentation

## 2015-11-06 NOTE — Patient Instructions (Signed)
Pt ed on doing contrast bath  Fitted with bilateral wrist splints for CTS  Med N glides provided  5 reps Tendon glides  10 reps  Stop prior to pain - when feel pull stop   Ed on joint protection - for avoid tight, sustained grip - and use larger joints
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
## 2015-11-20 ENCOUNTER — Ambulatory Visit: Payer: Medicare Other | Admitting: Occupational Therapy

## 2015-11-20 DIAGNOSIS — M25531 Pain in right wrist: Secondary | ICD-10-CM

## 2015-11-20 DIAGNOSIS — R202 Paresthesia of skin: Secondary | ICD-10-CM

## 2015-11-20 DIAGNOSIS — M6281 Muscle weakness (generalized): Secondary | ICD-10-CM

## 2015-11-20 DIAGNOSIS — M79641 Pain in right hand: Secondary | ICD-10-CM

## 2015-11-20 DIAGNOSIS — M79642 Pain in left hand: Secondary | ICD-10-CM | POA: Diagnosis not present

## 2015-11-20 NOTE — Therapy (Signed)
[place] [facility] [address] [place], [address] Phone: [phone]   Fax:  [phone]  Occupational Therapy Treatment  Patient Details  Name: MERLAND ALDANA MRN: [id number] Date of Birth: June 17, 1936 No Data Recorded  Encounter Date: 11/20/2015      OT End of Session - 11/20/15 1226    Visit Number 2   Number of Visits 4   Date for OT Re-Evaluation 12/04/15   OT Start Time 1002   OT Stop Time 1052   OT Time Calculation (min) 50 min   Activity Tolerance Patient tolerated treatment well   Behavior During Therapy [facility] for tasks assessed/performed      Past Medical History  Diagnosis Date  . Hypertension   . GERD (gastroesophageal reflux disease)   . Coronary artery disease   . Arthritis   . Anemia   . Dysrhythmia   . BPH (benign prostatic hypertrophy)     Past Surgical History  Procedure Laterality Date  . Hernia repair    . Transurethral resection of prostate    . Electrophysiologic study N/A 12/22/2014    Procedure: CARDIOVERSION;  Surgeon: Teodoro Spray, MD;  Location: [facility] ORS;  Service: Cardiovascular;  Laterality: N/A;    There were no vitals filed for this visit.      Subjective Assessment - 11/20/15 1007    Subjective  [place] and pins and needles is better in the L but R sitll the same and pain with squeezing on 4th and 5th knuckles    Currently in Pain? Yes   Pain Score 5    Pain Location Hand   Pain Orientation Right   Pain Descriptors / Indicators Aching   Aggravating Factors  Squeezing hand to greet or tight grip             [facility] OT Assessment - 11/20/15 0001    Strength   Right Hand Grip (lbs) 55   Right Hand Lateral Pinch 8 lbs   Right Hand 3 Point Pinch 17 lbs   Left Hand Grip (lbs) 61   Left Hand Lateral Pinch 10 lbs   Left Hand 3 Point Pinch 17 lbs   Right Hand AROM   R Ring  MCP 0-90 85 Degrees   R Ring PIP 0-100 100 Degrees   R Little  MCP 0-90 90  Degrees                  OT Treatments/Exercises (OP) - 11/20/15 0001    RUE Paraffin   Number Minutes Paraffin 10 Minutes   RUE Paraffin Location Hand   Comments AT SOC to decrease pain at 4th and 5th MC's , increase ROM at [facility]' s and PIP's       Measurements taken for ROM and grip/prehension See flow sheet    Soft tissue mobs to MC's - inbetween MC's , gentle traction , lateral bands  did joint mobs to MC's 4th and 5th  Soft tissue mobs to R volar forearm and CT - tightness at CT - did Graston tools nr 2 and 4 sweeping, scooping and brushing   had increase wrist extention on R by 10 degrees Some tenderness between metacarpals of 4th and 5th   Tendon glides - gentle AROM - no forcing or flexion of wrist - pt to stabilize wrist  Less pain with AROM then forcing  Wrist prayer stretch prior to soft tissue - and AROM wrist extention  OT Education - 11/20/15 1226    Education provided Yes   Education Details HEP   Person(s) Educated Patient   Methods Explanation;Demonstration;Tactile cues;Verbal cues   Comprehension Returned demonstration;Verbalized understanding;Verbal cues required          OT Short Term Goals - 11/06/15 1724    OT SHORT TERM GOAL #1   Title Pain on PRHWE improve with 15 points    Baseline 25/50 on PRWHE  for pain at eval    Time 3   Period Weeks   Status New   OT SHORT TERM GOAL #2   Title Pt wrist AROM improve to WNL compareto L to push up from chair   Baseline not pushing up - 40% difficulty   Time 4   Period Weeks   Status New           OT Long Term Goals - 11/06/15 1729    OT LONG TERM GOAL #1   Title Grip strength improve with 10 lbs to dig whole and use hand to hold steering wheel    Baseline 31 lbs R , 60 L grip    Time 4   Period Weeks   Status New   OT LONG TERM GOAL #2   Title Pt to be ind in HEP to decrease numbness in bilateral hands and decrease pain in 5th to greet people at church   Baseline  increase pain with hand shake , numbness in bilateral fingers tips - R worst than L    Time 4   Period Weeks   Status New               Plan - 11/20/15 1227    Clinical Impression Statement Pt showed increase  ROM and grip on the R hand - but report still pain inbetween 5th and 4th digits with gripping tight or squeezing during greeting - some constant numnbess in tips of fingers - numbness better in L hand - very little - pt did arrive with his wrist splint  wroing hand and  bar on top of hand - pt ed on wearing them correctly  - and using joint protection during picking up and gripping objects -    Rehab Potential Fair   OT Frequency 1x / week   OT Duration 4 weeks   OT Treatment/Interventions Self-care/ADL training;Therapeutic exercise;Patient/family education;Splinting;Manual Therapy;Ultrasound;Passive range of motion;Contrast Bath;Fluidtherapy;Parrafin   Plan assess numbness , splint wearing - and keeping wrist straight on the R hand - pain in R hand    OT Home Exercise Plan see pt instruction    Consulted and Agree with Plan of Care Patient      Patient will benefit from skilled therapeutic intervention in order to improve the following deficits and impairments:  Decreased range of motion, Impaired flexibility, Decreased strength, Pain, Impaired UE functional use, Increased edema  Visit Diagnosis: Pain in right hand  Paresthesia of both hands  Pain in right wrist  Muscle weakness (generalized)    Problem List Patient Active Problem List   Diagnosis Date Noted  . A-fib ([place]) 12/22/2014  . 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 Acid reflux 12/22/2014  . BP (high blood pressure) 12/22/2014  . Chronic anemia 11/08/2014    Rosalyn Gess OTR/L,CLT  11/20/2015, 12:32 PM  [place] [facility] [address] [place], [address] Phone: [phone]   Fax:  [phone]  Name: DONTEA DEPAULO MRN:  [id number] Date of Birth:  11/01/1935   

## 2015-11-20 NOTE — Patient Instructions (Addendum)
Reviewed again HEP Tendon glides - gentle AROM - no forcing or flexion of wrist - pt to stabilize wrist  Less pain with AROM then forcing  Prayer stretch pain free in shower  2-3 x day  Joint protection - using larger joints - Splint for wrist on 24 /7 R and night time and gripping or heavy act on L

## 2015-11-27 ENCOUNTER — Ambulatory Visit: Payer: Medicare Other | Admitting: Occupational Therapy

## 2015-11-27 DIAGNOSIS — M79641 Pain in right hand: Secondary | ICD-10-CM

## 2015-11-27 DIAGNOSIS — R202 Paresthesia of skin: Secondary | ICD-10-CM

## 2015-11-27 DIAGNOSIS — M25531 Pain in right wrist: Secondary | ICD-10-CM

## 2015-11-27 DIAGNOSIS — M6281 Muscle weakness (generalized): Secondary | ICD-10-CM

## 2015-11-27 DIAGNOSIS — M79642 Pain in left hand: Secondary | ICD-10-CM | POA: Diagnosis not present

## 2015-11-27 NOTE — Patient Instructions (Signed)
Pt to cont with same HEP - contrast on R , wrist splints 24.7 on R - off with HEP  And then night time only L   joint protection principles to be use - tight or full grip bother 4th and 5th

## 2015-11-27 NOTE — Therapy (Signed)
[place] [facility] [address], [place], [address] Phone: [phone]   Fax:  [phone]  Occupational Therapy Treatment  Patient Details  Name: Joseph Houston MRN: [id number] Date of Birth: [birth year] No Data Recorded  Encounter Date: 11/27/2015      OT End of Session - 11/27/15 1426    Visit Number 3   Date for OT Re-Evaluation 12/04/15   OT Start Time 1004   OT Stop Time 1059   OT Time Calculation (min) 55 min   Activity Tolerance Patient tolerated treatment well   Behavior During Therapy [facility] for tasks assessed/performed      Past Medical History  Diagnosis Date  . Hypertension   . GERD (gastroesophageal reflux disease)   . Coronary artery disease   . Arthritis   . Anemia   . Dysrhythmia   . BPH (benign prostatic hypertrophy)     Past Surgical History  Procedure Laterality Date  . Hernia repair    . Transurethral resection of prostate    . Electrophysiologic study N/A 12/22/2014    Procedure: CARDIOVERSION;  Surgeon: Teodoro Spray, MD;  Location: [facility] ORS;  Service: Cardiovascular;  Laterality: N/A;    There were no vitals filed for this visit.      Subjective Assessment - 11/27/15 1418    Subjective  THe pins and needles is better some more in my R hand but still at the tips some , and L good - maybe little at times close to nails - but my hand still hurts between pinkie and ring when they greet me or squeeze my hand    Patient Stated Goals Greet somebody with out my pinkie hurting, or dig hole for rose bush ,  pull grass for chickens, work in raised beds for gardening , open jars    Currently in Pain? No/denies            [facility] OT Assessment - 11/27/15 0001    AROM   Right Wrist Extension 60 Degrees   Right Wrist Flexion 85 Degrees   Left Wrist Extension 60 Degrees   Left Wrist Flexion 85 Degrees   Strength   Right Hand Grip (lbs) 58   Right Hand Lateral Pinch 13 lbs   Right  Hand 3 Point Pinch 17 lbs   Left Hand Grip (lbs) 64   Left Hand Lateral Pinch 13 lbs   Left Hand 3 Point Pinch 19 lbs   Right Hand AROM   R Ring  MCP 0-90 90 Degrees   R Ring PIP 0-100 100 Degrees                  OT Treatments/Exercises (OP) - 11/27/15 0001    Ultrasound   Ultrasound Location volar between 4th and 5th digits    Ultrasound Parameters 3.3MHZ at 20 , 1.0 int , for 4 min  at end of session    Ultrasound Goals Pain   RUE Paraffin   Number Minutes Paraffin 10 Minutes   RUE Paraffin Location Hand   Comments At [facility] to decrease pain at 4th and 5th - increase ROM  prior to ther ex     Measurements taken for ROM and grip/prehension 0- increased some more  See flow sheet   Soft tissue mobs to MC's - inbetween MC's , gentle traction , lateral bands at 4th and 5th  Pain increase with opponens to thumb with 5th , and resistance with 5th flexion  Wrist extention and flexion same R and L now - extention increased on R  Tendon glides - gentle AROM - no forcing or flexion of wrist - pt to stabilize wrist  Less pain with AROM then forcing /resistance - ADD of 5th            OT Education - 11/27/15 1426    Education provided Yes   Education Details HEP   Person(s) Educated Patient   Methods Explanation;Demonstration;Tactile cues;Verbal cues   Comprehension Returned demonstration;Verbalized understanding;Verbal cues required          OT Short Term Goals - 11/27/15 1429    OT SHORT TERM GOAL #1   Title Pain on PRHWE improve with 15 points    Baseline 25/50 on PRWHE  for pain at eval - improving - no pain at wrist now    Time 3   Period Weeks   Status On-going   OT SHORT TERM GOAL #2   Title Pt wrist AROM improve to WNL compareto L to push up from chair   Status Achieved           OT Long Term Goals - 11/27/15 1430    OT LONG TERM GOAL #1   Title Grip strength improve with 10 lbs to dig whole and use hand to hold steering wheel     Baseline improving but still not gripping ojbects with weight tight    Time 3   Period Weeks   Status On-going   OT LONG TERM GOAL #2   Title Pt to be ind in HEP to decrease numbness in bilateral hands and decrease pain in 5th to greet people at church   Baseline pain in wrist better , numbness bilateral better - but still pain at 4th and 5th with greeting    Time 3   Period Weeks   Status On-going               Plan - 11/27/15 1427    Clinical Impression Statement Pt CTS improving in bilateral hands - pt to cont with splinting and contrast - as well as tendon glides and MEd N glides - but pt report pain between 4th and 5th still same 5/10 using it and when greeting somebody 8/10 -  pt do have pain in clinici with oppostion to thumb and  resistance in flexion of 5th  - wil contact Dr Ginette Pitman about  maybe another xray or referral to  Ortho    Rehab Potential Fair   OT Frequency 1x / week   OT Duration 2 weeks   OT Treatment/Interventions Self-care/ADL training;Therapeutic exercise;Patient/family education;Splinting;Manual Therapy;Ultrasound;Passive range of motion;Contrast Bath;Fluidtherapy;Parrafin   Plan phone MD about another xray or ortho referral   OT Home Exercise Plan see pt instruction    Consulted and Agree with Plan of Care Patient      Patient will benefit from skilled therapeutic intervention in order to improve the following deficits and impairments:  Decreased range of motion, Impaired flexibility, Decreased strength, Pain, Impaired UE functional use, Increased edema  Visit Diagnosis: Paresthesia of both hands  Pain in right wrist  Muscle weakness (generalized)  Pain in right hand  Pain in left hand    Problem List Patient Active Problem List   Diagnosis Date Noted  . A-fib ([place]) 12/22/2014  . 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 Acid reflux 12/22/2014  . BP (high blood pressure) 12/22/2014  . Chronic anemia 11/08/2014    Rosalyn Gess  OTR/L,CLT  11/27/2015, 2:32 PM  [place] [facility] [address], [place], [address] Phone: [phone]   Fax:  [phone]  Name: Joseph Houston MRN: [id number] Date of Birth: 09-Jun-1936

## 2015-12-01 ENCOUNTER — Encounter: Payer: Self-pay | Admitting: Emergency Medicine

## 2015-12-01 ENCOUNTER — Emergency Department: Payer: Medicare Other

## 2015-12-01 ENCOUNTER — Emergency Department
Admission: EM | Admit: 2015-12-01 | Discharge: 2015-12-01 | Disposition: A | Discharge status: Home / Self Care | Payer: Medicare Other | Attending: Emergency Medicine | Admitting: Emergency Medicine

## 2015-12-01 DIAGNOSIS — M1712 Unilateral primary osteoarthritis, left knee: Secondary | ICD-10-CM | POA: Diagnosis not present

## 2015-12-01 DIAGNOSIS — M25462 Effusion, left knee: Secondary | ICD-10-CM | POA: Diagnosis not present

## 2015-12-01 DIAGNOSIS — I4891 Unspecified atrial fibrillation: Secondary | ICD-10-CM | POA: Insufficient documentation

## 2015-12-01 DIAGNOSIS — I2510 Atherosclerotic heart disease of native coronary artery without angina pectoris: Secondary | ICD-10-CM | POA: Diagnosis not present

## 2015-12-01 DIAGNOSIS — Z792 Long term (current) use of antibiotics: Secondary | ICD-10-CM | POA: Diagnosis not present

## 2015-12-01 DIAGNOSIS — I10 Essential (primary) hypertension: Secondary | ICD-10-CM | POA: Diagnosis not present

## 2015-12-01 DIAGNOSIS — M25562 Pain in left knee: Secondary | ICD-10-CM | POA: Diagnosis present

## 2015-12-01 DIAGNOSIS — Z87891 Personal history of nicotine dependence: Secondary | ICD-10-CM | POA: Insufficient documentation

## 2015-12-01 DIAGNOSIS — Z79899 Other long term (current) drug therapy: Secondary | ICD-10-CM | POA: Insufficient documentation

## 2015-12-01 LAB — BASIC METABOLIC PANEL
Anion gap: 9 (ref 5–15)
BUN: 23 mg/dL — ABNORMAL HIGH (ref 6–20)
CALCIUM: 9.4 mg/dL (ref 8.9–10.3)
CO2: 26 mmol/L (ref 22–32)
CREATININE: 1.41 mg/dL — AB (ref 0.61–1.24)
Chloride: 106 mmol/L (ref 101–111)
GFR calc Af Amer: 54 mL/min — ABNORMAL LOW (ref >60)
GFR calc non Af Amer: 47 mL/min — ABNORMAL LOW (ref >60)
GLUCOSE: 114 mg/dL — AB (ref 65–99)
Potassium: 4.4 mmol/L (ref 3.5–5.1)
Sodium: 141 mmol/L (ref 135–145)

## 2015-12-01 LAB — CBC
HCT: 40.9 % (ref 40.0–52.0)
Hemoglobin: 13.7 g/dL (ref 13.0–18.0)
MCH: 29.3 pg (ref 26.0–34.0)
MCHC: 33.4 g/dL (ref 32.0–36.0)
MCV: 87.7 fL (ref 80.0–100.0)
PLATELETS: 194 10*3/uL (ref 150–440)
RBC: 4.67 MIL/uL (ref 4.40–5.90)
RDW: 13.9 % (ref 11.5–14.5)
WBC: 10 10*3/uL (ref 3.8–10.6)

## 2015-12-01 LAB — URINALYSIS COMPLETE WITH MICROSCOPIC (ARMC ONLY)
Bacteria, UA: NONE SEEN
Bilirubin Urine: NEGATIVE
Glucose, UA: NEGATIVE mg/dL
Hgb urine dipstick: NEGATIVE
Leukocytes, UA: NEGATIVE
Nitrite: NEGATIVE
PROTEIN: NEGATIVE mg/dL
Specific Gravity, Urine: 1.014 (ref 1.005–1.030)
Squamous Epithelial / LPF: NONE SEEN
WBC UA: NONE SEEN WBC/hpf (ref 0–5)
pH: 5 (ref 5.0–8.0)

## 2015-12-01 LAB — TROPONIN I: Troponin I: <0.03 ng/mL (ref <0.031)

## 2015-12-01 MED ORDER — OXYCODONE HCL 5 MG PO TABS: 5.0000 mg | ORAL_TABLET | Freq: Four times a day (QID) | ORAL | Status: DC | PRN

## 2015-12-01 MED ORDER — KETOROLAC TROMETHAMINE 30 MG/ML IJ SOLN
30.0000 mg | Freq: Once | INTRAMUSCULAR | Status: AC
  Administered 2015-12-01: 30 mg via INTRAVENOUS
  Filled 2015-12-01: qty 1

## 2015-12-01 MED ORDER — ONDANSETRON 4 MG PO TBDP: 4.0000 mg | ORAL_TABLET | Freq: Three times a day (TID) | ORAL | Status: DC | PRN

## 2015-12-01 NOTE — ED Notes (Signed)
Patient states that he was standing in the kitchen and had a dizzy spell. Patient states that he lost his balance and fell landing on his left knee. Patient has tenderness on palpation. No obvious deformities noted

## 2015-12-01 NOTE — ED Notes (Signed)
Pt. States having a hotdog at around 1pm today when he became dizzy and fell on lt. Knee.  Pt. States unable to put weight on lt. Knee.  Pt. Denies LOC.

## 2015-12-01 NOTE — ED Notes (Signed)
Report given to Christine, RN

## 2015-12-01 NOTE — ED Notes (Signed)
[address] he fell on left knee today  conts to have left knee pain but was dizzy prior to fall

## 2015-12-01 NOTE — Discharge Instructions (Signed)
You were prescribed a medication that is potentially sedating. Do not drink alcohol, drive or participate in any other potentially dangerous activities while taking this medication as it may make you sleepy. Do not take this medication with any other sedating medications, either prescription or over-the-counter. If you were prescribed Percocet or Vicodin, do not take these with acetaminophen (Tylenol) as it is already contained within these medications.   Opioid pain medications (or "narcotics") can be habit forming.  Use it as little as possible to achieve adequate pain control.  Do not use or use it with extreme caution if you have a history of opiate abuse or dependence.  If you are on a pain contract with your primary care doctor or a pain specialist, be sure to let them know you were prescribed this medication today from the [facility] Emergency Department.  This medication is intended for your use only - do not give any to anyone else and keep it in a secure place where nobody else, especially children and pets, have access to it.  It will also cause or worsen constipation, so you may want to consider taking an over-the-counter stool softener while you are taking this medication.  How to Use a Knee Brace A knee brace is a device that you wear to support your knee, especially if the knee is healing after an injury or surgery. There are several types of knee braces. Some are designed to prevent an injury (prophylactic brace). These are often worn during sports. Others support an injured knee (functional brace) or keep it still while it heals (rehabilitative brace). People with severe arthritis of the knee may benefit from a brace that takes some pressure off the knee (unloader brace). Most knee braces are made from a combination of cloth and metal or plastic.  You may need to wear a knee brace to:  Relieve knee pain.  Help your knee support your weight (improve stability).  Help you walk  farther (improve mobility).  Prevent injury.  Support your knee while it heals from surgery or from an injury. RISKS AND COMPLICATIONS Generally, knee braces are very safe to wear. However, problems may occur, including:  Skin irritation that may lead to infection.  Making your condition worse if you wear the brace in the wrong way. HOW TO USE A KNEE BRACE Different braces will have different instructions for use. Your health care provider will tell you or show you:  How to put on your brace.  How to adjust the brace.  When and how often to wear the brace.  How to remove the brace.  If you will need any assistive devices in addition to the brace, such as crutches or a cane. In general, your brace should:  Have the hinge of the brace line up with the bend of your knee.  Have straps, hooks, or tapes that fasten snugly around your leg.  Not feel too tight or too loose. HOW TO CARE FOR A KNEE BRACE  Check your brace often for signs of damage, such as loose connections or attachments. Your knee brace may get damaged or wear out during normal use.  Wash the fabric parts of your brace with soap and water.  Read the insert that comes with your brace for other specific care instructions. SEEK MEDICAL CARE IF:  Your knee brace is too loose or too tight and you cannot adjust it.  Your knee brace causes skin redness, swelling, bruising, or irritation.  Your knee brace  is not helping.  Your knee brace is making your knee pain worse.   This information is not intended to replace advice given to you by your health care provider. Make sure you discuss any questions you have with your health care provider.   Document Released: 10/12/2003 Document Revised: 04/12/2015 Document Reviewed: 11/14/2014 Elsevier Interactive Patient Education 2016 [place] therapy can help ease sore, stiff, injured, and tight muscles and joints. Heat relaxes your muscles, which  may help ease your pain.  RISKS AND COMPLICATIONS If you have any of the following conditions, do not use heat therapy unless your health care provider has approved:  Poor circulation.  Healing wounds or scarred skin in the area being treated.  Diabetes, heart disease, or high blood pressure.  Not being able to feel (numbness) the area being treated.  Unusual swelling of the area being treated.  Active infections.  Blood clots.  Cancer.  Inability to communicate pain. This may include young children and people who have problems with their brain function (dementia).  Pregnancy. Heat therapy should only be used on old, pre-existing, or long-lasting (chronic) injuries. Do not use heat therapy on new injuries unless directed by your health care provider. HOW TO USE HEAT THERAPY There are several different kinds of heat therapy, including:  Moist heat pack.  Warm water bath.  Hot water bottle.  Electric heating pad.  Heated gel pack.  Heated wrap.  Electric heating pad. Use the heat therapy method suggested by your health care provider. Follow your health care provider's instructions on when and how to use heat therapy. GENERAL HEAT THERAPY RECOMMENDATIONS  Do not sleep while using heat therapy. Only use heat therapy while you are awake.  Your skin may turn pink while using heat therapy. Do not use heat therapy if your skin turns red.  Do not use heat therapy if you have new pain.  High heat or long exposure to heat can cause burns. Be careful when using heat therapy to avoid burning your skin.  Do not use heat therapy on areas of your skin that are already irritated, such as with a rash or sunburn. SEEK MEDICAL CARE IF:  You have blisters, redness, swelling, or numbness.  You have new pain.  Your pain is worse. MAKE SURE YOU:  Understand these instructions.  Will watch your condition.  Will get help right away if you are not doing well or get worse.     This information is not intended to replace advice given to you by your health care provider. Make sure you discuss any questions you have with your health care provider.   Document Released: 10/14/2011 Document Revised: 08/12/2014 Document Reviewed: 09/14/2013 Elsevier Interactive Patient Education 2016 Elsevier Inc.  Knee Effusion Knee effusion means that you have excess fluid in your knee joint. This can cause pain and swelling in your knee. This may make your knee more difficult to bend and move. That is because there is increased pain and pressure in the joint. If there is fluid in your knee, it often means that something is wrong inside your knee, such as severe arthritis, abnormal inflammation, or an infection. Another common cause of knee effusion is an injury to the knee muscles, ligaments, or cartilage. HOME CARE INSTRUCTIONS  Use crutches as directed by your health care provider.  Wear a knee brace as directed by your health care provider.  Apply ice to the swollen area:  Put ice in a plastic  bag.  Place a towel between your skin and the bag.  Leave the ice on for 20 minutes, 2-3 times per day.  Keep your knee raised (elevated) when you are sitting or lying down.  Take medicines only as directed by your health care provider.  Do any rehabilitation or strengthening exercises as directed by your health care provider.  Rest your knee as directed by your health care provider. You may start doing your normal activities again when your health care provider approves.   Keep all follow-up visits as directed by your health care provider. This is important. SEEK MEDICAL CARE IF:  You have ongoing (persistent) pain in your knee. SEEK IMMEDIATE MEDICAL CARE IF:  You have increased swelling or redness of your knee.  You have severe pain in your knee.  You have a fever.   This information is not intended to replace advice given to you by your health care provider. Make sure  you discuss any questions you have with your health care provider.   Document Released: 10/12/2003 Document Revised: 08/12/2014 Document Reviewed: 03/07/2014 Elsevier Interactive Patient Education 2016 Elsevier Inc.  Osteoarthritis Osteoarthritis is a disease that causes soreness and inflammation of a joint. It occurs when the cartilage at the affected joint wears down. Cartilage acts as a cushion, covering the ends of bones where they meet to form a joint. Osteoarthritis is the most common form of arthritis. It often occurs in older people. The joints affected most often by this condition include those in the:  Ends of the fingers.  Thumbs.  Neck.  Lower back.  Knees.  Hips. CAUSES  Over time, the cartilage that covers the ends of bones begins to wear away. This causes bone to rub on bone, producing pain and stiffness in the affected joints.  RISK FACTORS Certain factors can increase your chances of having osteoarthritis, including:  Older age.  Excessive body weight.  Overuse of joints.  Previous joint injury. SIGNS AND SYMPTOMS   Pain, swelling, and stiffness in the joint.  Over time, the joint may lose its normal shape.  Small deposits of bone (osteophytes) may grow on the edges of the joint.  Bits of bone or cartilage can break off and float inside the joint space. This may cause more pain and damage. DIAGNOSIS  Your health care provider will do a physical exam and ask about your symptoms. Various tests may be ordered, such as:  X-rays of the affected joint.  Blood tests to rule out other types of arthritis. Additional tests may be used to diagnose your condition. TREATMENT  Goals of treatment are to control pain and improve joint function. Treatment plans may include:  A prescribed exercise program that allows for rest and joint relief.  A weight control plan.  Pain relief techniques, such as:  Properly applied heat and cold.  Electric pulses delivered  to nerve endings under the skin (transcutaneous electrical nerve stimulation [TENS]).  Massage.  Certain nutritional supplements.  Medicines to control pain, such as:  Acetaminophen.  Nonsteroidal anti-inflammatory drugs (NSAIDs), such as naproxen.  Narcotic or central-acting agents, such as tramadol.  Corticosteroids. These can be given orally or as an injection.  Surgery to reposition the bones and relieve pain (osteotomy) or to remove loose pieces of bone and cartilage. Joint replacement may be needed in advanced states of osteoarthritis. HOME CARE INSTRUCTIONS   Take medicines only as directed by your health care provider.  Maintain a healthy weight. Follow your health care provider's  instructions for weight control. This may include dietary instructions.  Exercise as directed. Your health care provider can recommend specific types of exercise. These may include:  Strengthening exercises. These are done to strengthen the muscles that support joints affected by arthritis. They can be performed with weights or with exercise bands to add resistance.  Aerobic activities. These are exercises, such as brisk walking or low-impact aerobics, that get your heart pumping.  Range-of-motion activities. These keep your joints limber.  Balance and agility exercises. These help you maintain daily living skills.  Rest your affected joints as directed by your health care provider.  Keep all follow-up visits as directed by your health care provider. SEEK MEDICAL CARE IF:   Your skin turns red.  You develop a rash in addition to your joint pain.  You have worsening joint pain.  You have a fever along with joint or muscle aches. SEEK IMMEDIATE MEDICAL CARE IF:  You have a significant loss of weight or appetite.  You have night sweats. [place] of Arthritis and Musculoskeletal and Skin Diseases: www.niams.SouthEx[employer].es  [employer] on Aging:  http://kim-miller.com/  American College of Rheumatology: www.rheumatology.org   This information is not intended to replace advice given to you by your health care provider. Make sure you discuss any questions you have with your health care provider.   Document Released: 07/22/2005 Document Revised: 08/12/2014 Document Reviewed: 03/29/2013 Elsevier Interactive Patient Education [employer].

## 2015-12-01 NOTE — ED Provider Notes (Signed)
[facility] Emergency Department Provider Note  ____________________________________________  Time seen: 6:35 PM  I have reviewed the triage vital signs and the nursing notes.   HISTORY  Chief Complaint Weakness and Knee Pain    HPI Joseph Houston is a 80 y.o. male who complains of knee pain after a fall today. At around 1:10 PM, he was standing up and eating a hot dog at his kitchen at home when he became dizzy and fell onto his left knee. He denies loss of consciousness or head injury. He has no pain anywhere except for in the left knee. Other than the dizziness he had no preceding symptoms. He had no symptoms afterwards except the left knee where he had fallen. He does have chronic osteoarthritis in the knee and has been told he needs a knee replacement but has been deferring this surgery. States it is otherwise at his usual state of health and has no other complaints or concerns. No chest pain or shortness of breath at all.     Past Medical History  Diagnosis Date  . Hypertension   . GERD (gastroesophageal reflux disease)   . Coronary artery disease   . Arthritis   . Anemia   . Dysrhythmia   . BPH (benign prostatic hypertrophy)      Patient Active Problem List   Diagnosis Date Noted  . A-fib ([place]) 12/22/2014  . 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 Acid reflux 12/22/2014  . BP (high blood pressure) 12/22/2014  . Chronic anemia 11/08/2014     Past Surgical History  Procedure Laterality Date  . Hernia repair    . Transurethral resection of prostate    . Electrophysiologic study N/A 12/22/2014    Procedure: CARDIOVERSION;  Surgeon: Teodoro Spray, MD;  Location: [facility] ORS;  Service: Cardiovascular;  Laterality: N/A;     Current Outpatient Rx  Name  Route  Sig  Dispense  Refill  . acetaminophen (TYLENOL) 325 MG tablet   Oral   Take 650 mg by mouth every 4 (four) hours as needed for mild pain.         [name] amoxicillin (AMOXIL) 875 MG  tablet   Oral   Take 1 tablet by mouth 2 (two) times daily.         [name] apixaban (ELIQUIS) 5 MG TABS tablet   Oral   Take 5 mg by mouth 2 (two) times daily.         [name] azelastine (ASTELIN) 0.1 % nasal spray   Each Nare   Place 2 sprays into both nostrils 2 (two) times daily. Use in each nostril as directed PRN         . cyanocobalamin 1000 MCG tablet   Oral   Take 100 mcg by mouth daily.         . cyclobenzaprine (FLEXERIL) 5 MG tablet   Oral   Take 1 tablet (5 mg total) by mouth every 8 (eight) hours as needed for muscle spasms. Patient not taking: Reported on 09/20/2015   12 tablet   0   . EPINEPHrine 0.3 mg/0.3 mL IJ SOAJ injection   Intramuscular   Inject 0.3 mg into the muscle once. As needed         . flecainide (TAMBOCOR) 100 MG tablet   Oral   Take 100 mg by mouth 2 (two) times daily.         . fluticasone (FLONASE) 50 MCG/ACT nasal spray   Each Nare  Place 2 sprays into both nostrils daily. PRN         . losartan (COZAAR) 100 MG tablet   Oral   Take 100 mg by mouth daily. At bedtime         . metoprolol succinate (TOPROL-XL) 25 MG 24 hr tablet   Oral   Take 25 mg by mouth daily. At bedtime         . omega-3 acid ethyl esters (LOVAZA) 1 G capsule   Oral   Take by mouth 2 (two) times daily.         [name] omeprazole (PRILOSEC) 20 MG capsule   Oral   Take 20 mg by mouth 2 (two) times daily before a meal.         . ondansetron (ZOFRAN ODT) 4 MG disintegrating tablet   Oral   Take 1 tablet (4 mg total) by mouth every 8 (eight) hours as needed for nausea or vomiting.   20 tablet   0   . oxyCODONE (ROXICODONE) 5 MG immediate release tablet   Oral   Take 1 tablet (5 mg total) by mouth every 6 (six) hours as needed for breakthrough pain.   12 tablet   0   . traMADol (ULTRAM) 50 MG tablet   Oral   Take by mouth 2 (two) times daily.         . traMADol (ULTRAM) 50 MG tablet   Oral   Take 1 tablet (50 mg total) by mouth 2 (two) times  daily.   10 tablet   0      Allergies Codeine and Sulfa antibiotics   History reviewed. No pertinent family history.  Social History Social History  Substance Use Topics  . Smoking status: Former Smoker -- 2.00 packs/day    Types: Cigarettes    Quit date: 12/21/1965  . Smokeless tobacco: None  . Alcohol Use: No    Review of Systems  Constitutional:   No fever or chills.  Eyes:   No vision changes.  ENT:   No sore throat. No rhinorrhea. Cardiovascular:   No chest pain. Respiratory:   No dyspnea or cough. Gastrointestinal:   Negative for abdominal pain, vomiting and diarrhea.  Genitourinary:   Negative for dysuria or difficulty urinating. Musculoskeletal:   Negative for focal pain or swelling Neurological:   Negative for headaches. Episode of dizziness today which is resolved. 10-point ROS otherwise negative.  ____________________________________________   PHYSICAL EXAM:  VITAL SIGNS: ED Triage Vitals  Enc Vitals Group     BP 12/01/15 1807 158/76 mmHg     Pulse Rate 12/01/15 1807 64     Resp 12/01/15 1807 18     Temp 12/01/15 1807 98.1 F (36.7 C)     Temp Source 12/01/15 1807 Oral     SpO2 12/01/15 1807 95 %     Weight 12/01/15 1807 262 lb (118.842 kg)     Height 12/01/15 1807 5\' 11"  (1.803 m)     Head Cir --      Peak Flow --      Pain Score 12/01/15 1810 10     Pain Loc --      Pain Edu? --      Excl. in [place]? --     Vital signs reviewed, nursing assessments reviewed.   Constitutional:   Alert and oriented. Well appearing and in no distress. Eyes:   No scleral icterus. No conjunctival pallor. PERRL. EOMI.  No nystagmus. ENT   Head:  Normocephalic and atraumatic.   Nose:   No congestion/rhinnorhea. No septal hematoma   Mouth/Throat:   MMM, no pharyngeal erythema. No peritonsillar mass.    Neck:   No stridor. No SubQ emphysema. No meningismus. Hematological/Lymphatic/Immunilogical:   No cervical lymphadenopathy. Cardiovascular:   RRR.  Symmetric bilateral radial and DP pulses.  No murmurs.  Respiratory:   Normal respiratory effort without tachypnea nor retractions. Breath sounds are clear and equal bilaterally. No wheezes/rales/rhonchi. Gastrointestinal:   Soft and nontender. Non distended. There is no CVA tenderness.  No rebound, rigidity, or guarding. Genitourinary:   deferred Musculoskeletal:   Mild tenderness of the left knee at the distal femur near the joint line. There is a palpable knee effusion. Patella is nontender and not ballotable. There is a strong popliteal pulse. No ecchymosis or bony point tenderness or crepitus or deformity. Ligaments are stable. Intact range of motion. Neurologic:   Normal speech and language.  CN 2-10 normal. Motor grossly intact. No gross focal neurologic deficits are appreciated.   ____________________________________________    LABS (pertinent positives/negatives) (all labs ordered are listed, but only abnormal results are displayed) Labs Reviewed  BASIC METABOLIC PANEL - Abnormal; Notable for the following:    Glucose, Bld 114 (*)    BUN 23 (*)    Creatinine, Ser 1.41 (*)    GFR calc non Af Amer 47 (*)    GFR calc Af Amer 54 (*)    All other components within normal limits  URINALYSIS COMPLETEWITH MICROSCOPIC ([facility] ONLY) - Abnormal; Notable for the following:    Color, Urine YELLOW (*)    APPearance CLEAR (*)    Ketones, ur TRACE (*)    All other components within normal limits  CBC  TROPONIN I  CBG MONITORING, ED   ____________________________________________   EKG  Interpreted by me Sinus bradycardia rate of 58, left axis, first-degree AV block with PR interval of 240 ms. Normal QRS except for voltage criteria for LVH. There is J-point elevation in V2 and V3 with preserved T-wave upslope concavity. No reciprocal depressions. No T-wave inversions.  ____________________________________________    RADIOLOGY  X-ray left knee shows tricompartmental osteoarthritis  of the left knee with a large suprapatellar effusion. No fractures.  ____________________________________________   PROCEDURES   ____________________________________________   INITIAL IMPRESSION / ASSESSMENT AND PLAN / ED COURSE  Pertinent labs & imaging results that were available during my care of the patient were reviewed by me and considered in my medical decision making (see chart for details).  Patient presents about 5-1/2 hours after an episode of dizziness that caused the fall today. No clear explanation for why the dizziness occurred but he had no worrisome preceding or subsequent symptoms. With his medical history, we'll check troponin and labs and knee x-ray. EKG shows an unusual finding in the anterior leads, Which appears to be consistent with his previous EKG pattern on x-rays dating back at least to 2014. Similarly his cardiology clinic note from March 2017 with Dr. Ubaldo Glassing describes similar nonspecific ST changes. In the absence of chest pain or shortness of breath or any other anginal symptoms, and absence of any reciprocal changes, I do not think this represents acute ischemia or STEMI.  ----------------------------------------- 8:29 PM on 12/01/2015 -----------------------------------------  Workup negative. Remains asymptomatic other than knee pain. Toradol given. 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 Will follow up with orthopedics. Ace wrap, heat therapy, crutches for  now.     ____________________________________________   FINAL CLINICAL IMPRESSION(S) / ED DIAGNOSES  Final diagnoses:  Knee effusion, left  Primary osteoarthritis of left knee  Left knee pain     Portions of this note were generated with dragon dictation software. Dictation errors may occur despite best attempts at proofreading.   Carrie Mew, MD 12/01/15 2030

## 2015-12-07 ENCOUNTER — Ambulatory Visit: Payer: Medicare Other | Attending: Internal Medicine | Admitting: Occupational Therapy

## 2015-12-07 DIAGNOSIS — M6281 Muscle weakness (generalized): Secondary | ICD-10-CM | POA: Diagnosis present

## 2015-12-07 DIAGNOSIS — M79641 Pain in right hand: Secondary | ICD-10-CM | POA: Insufficient documentation

## 2015-12-07 DIAGNOSIS — R202 Paresthesia of skin: Secondary | ICD-10-CM | POA: Insufficient documentation

## 2015-12-07 NOTE — Patient Instructions (Signed)
    Same HEP but buddy strap to wear 4thand 5th  2 hrs on and off during day

## 2015-12-07 NOTE — Therapy (Signed)
[facility] [address], [place], [address] Phone: [phone]   Fax:  [phone]  Occupational Therapy Treatment  Patient Details  Name: Joseph Houston MRN: [id number] Date of Birth: Jun 30, 1936 No Data Recorded  Encounter Date: 12/07/2015      OT End of Session - 12/07/15 1126    Visit Number 4   Number of Visits 5   Date for OT Re-Evaluation 12/21/15   OT Start Time 0935   OT Stop Time 1016   OT Time Calculation (min) 41 min   Activity Tolerance Patient tolerated treatment well   Behavior During Therapy [facility] for tasks assessed/performed      Past Medical History  Diagnosis Date  . Hypertension   . GERD (gastroesophageal reflux disease)   . Coronary artery disease   . Arthritis   . Anemia   . Dysrhythmia   . BPH (benign prostatic hypertrophy)     Past Surgical History  Procedure Laterality Date  . Hernia repair    . Transurethral resection of prostate    . Electrophysiologic study N/A 12/22/2014    Procedure: CARDIOVERSION;  Surgeon: Teodoro Spray, MD;  Location: [facility] ORS;  Service: Cardiovascular;  Laterality: N/A;    There were no vitals filed for this visit.      Subjective Assessment - 12/07/15 1050    Subjective  I past out last Friday and hurt my knee - went to ER and seen Dr Sabra Heck - he put brace on and on crutches - he said he can look at my hand next time - but my hand is car insurance and I think I am suppose to see somebody at [facility] clinci - pins and needles  good - non in both hands    Patient Stated Goals Greet somebody with out my pinkie hurting, or dig hole for rose bush ,  pull grass for chickens, work in raised beds for gardening , open jars    Currently in Pain? Yes   Pain Score 3    Pain Location Finger (Comment which one)   Pain Orientation Left   Pain Descriptors / Indicators Aching                      OT Treatments/Exercises (OP) - 12/07/15  0001    Ultrasound   Ultrasound Location between 4th and 5th MC's - volar and dorsal    Ultrasound Parameters 3.3MHZ, 1.0 intensity , 20% for 5 min after ROM    Ultrasound Goals Pain   RUE Paraffin   Number Minutes Paraffin 10 Minutes   RUE Paraffin Location Hand   Comments At [facility] to decrease pain  at 4th and 5th       Soft tissue mobs to MC's - inbetween MC's , gentle traction , lateral bands at 4th and 5th  Pain did not increase more than 3/10 with making fist, resistance to ABD and ADD of 5th , opposition 5th to thumb , - no pain with just AROM into fist - increase pain if squeeze or tight fist    Tendon glides - gentle AROM - no forcing or flexion of wrist - pt to stabilize wrist  - reinforce again this date  Did add buddy strap for 5th ato 4th to wear on and off during day 2hrs - to decrease pain - did do some taping this past week -and pain was less this date between 4th and 5th  OT Education - 12/07/15 1247    Education provided Yes   Education Details HEP    Person(s) Educated Patient   Methods Explanation;Demonstration;Tactile cues;Verbal cues   Comprehension Returned demonstration;Verbalized understanding;Verbal cues required          OT Short Term Goals - 12/07/15 1249    OT SHORT TERM GOAL #1   Title Pain on PRHWE improve with 15 points    Baseline 25/50 on PRWHE  for pain at eval - improving - no pain at wrist now - pain at most 3/10    Time 2   Period Weeks   Status On-going   OT SHORT TERM GOAL #2   Title Pt wrist AROM improve to WNL compareto L to push up from chair   Status Achieved           OT Long Term Goals - 12/07/15 1249    OT LONG TERM GOAL #1   Title Grip strength improve with 10 lbs to dig whole and use hand to hold steering wheel    Baseline improving but still not gripping ojbects with weight tight    Time 2   Period Weeks   Status On-going   OT LONG TERM GOAL #2   Title Pt to be ind in HEP to decrease  numbness in bilateral hands and decrease pain in 5th to greet people at church   Baseline pain in wrist better , numbness bilateral better - but still pain at 4th and 5th with greeting    Time 2   Period Weeks   Status On-going               Plan - 12/07/15 1127    Clinical Impression Statement Pt report no pins nad needles in R or L hand this date - pt to only sleep now with wrist splint on R - and wear L as needed - pt did show decrease pain this date at 4th and 5th MC's during reistance ot 5th ADD and ABD , opposition and  hand shake - pt at the most was 3/10 - compare to last time 5-8/10 with these tasks - pt to return in 2 wks    Rehab Potential Fair   OT Frequency Biweekly   OT Duration 2 weeks   OT Treatment/Interventions Self-care/ADL training;Therapeutic exercise;Patient/family education;Splinting;Manual Therapy;Ultrasound;Passive range of motion;Contrast Bath;Fluidtherapy;Parrafin   Plan assess if symptoms better and can be discharge   OT Home Exercise Plan see pt instruction    Consulted and Agree with Plan of Care Patient      Patient will benefit from skilled therapeutic intervention in order to improve the following deficits and impairments:  Decreased range of motion, Impaired flexibility, Decreased strength, Pain, Impaired UE functional use, Increased edema  Visit Diagnosis: Paresthesia of both hands  Muscle weakness (generalized)  Pain in right hand    Problem List Patient Active Problem List   Diagnosis Date Noted  . A-fib ([place]) 12/22/2014  . 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 Acid reflux 12/22/2014  . BP (high blood pressure) 12/22/2014  . Chronic anemia 11/08/2014    Rosalyn Gess OTR/L,CLT  12/07/2015, 12:51 PM  [facility] [address], [place], [address] Phone: [phone]   Fax:  [phone]  Name: Joseph Houston MRN: [id number] Date of Birth:  1936-06-12

## 2015-12-21 ENCOUNTER — Ambulatory Visit: Payer: Medicare Other | Admitting: Occupational Therapy

## 2015-12-21 DIAGNOSIS — M79641 Pain in right hand: Secondary | ICD-10-CM

## 2015-12-21 DIAGNOSIS — M6281 Muscle weakness (generalized): Secondary | ICD-10-CM

## 2015-12-21 DIAGNOSIS — R202 Paresthesia of skin: Secondary | ICD-10-CM | POA: Diagnosis not present

## 2015-12-21 NOTE — Patient Instructions (Signed)
Pt to hold off on exercises or any tight grip - or contrast for few days after shot from yesterday

## 2015-12-21 NOTE — Therapy (Signed)
[facility] [address], [place], [address] Phone: [phone]   Fax:  [phone]  Occupational Therapy Treatment  Patient Details  Name: Joseph Houston MRN: [id number] Date of Birth: 1936-05-30 No Data Recorded  Encounter Date: 12/21/2015      OT End of Session - 12/21/15 0943    Visit Number 5   Number of Visits 5   Date for OT Re-Evaluation 12/21/15   OT Start Time 0920   OT Stop Time 0939   OT Time Calculation (min) 19 min   Activity Tolerance Patient tolerated treatment well   Behavior During Therapy [facility] for tasks assessed/performed      Past Medical History  Diagnosis Date  . Hypertension   . GERD (gastroesophageal reflux disease)   . Coronary artery disease   . Arthritis   . Anemia   . Dysrhythmia   . BPH (benign prostatic hypertrophy)     Past Surgical History  Procedure Laterality Date  . Hernia repair    . Transurethral resection of prostate    . Electrophysiologic study N/A 12/22/2014    Procedure: CARDIOVERSION;  Surgeon: Teodoro Spray, MD;  Location: [facility] ORS;  Service: Cardiovascular;  Laterality: N/A;    There were no vitals filed for this visit.      Subjective Assessment - 12/21/15 0925    Subjective  Seen the ortho MD yesterday - they gave me shot and if not helping to come back in 10 days- it hurt- but still only hurts when squeezed or sideways    Patient Stated Goals Greet somebody with out my pinkie hurting, or dig hole for rose bush ,  pull grass for chickens, work in raised beds for gardening , open jars    Currently in Pain? Yes   Pain Score 3    Pain Location Finger (Comment which one)   Pain Orientation Left   Pain Descriptors / Indicators Aching            [facility] OT Assessment - 12/21/15 0001    Strength   Right Hand Grip (lbs) 58   Right Hand Lateral Pinch 13 lbs   Right Hand 3 Point Pinch 17 lbs   Left Hand Grip (lbs) 64   Left Hand Lateral  Pinch 13 lbs   Left Hand 3 Point Pinch 19 lbs   Right Hand AROM   R Ring  MCP 0-90 90 Degrees   R Ring PIP 0-100 100 Degrees   R Little  MCP 0-90 90 Degrees   R Little PIP 0-100 100 Degrees     did not do any modalities since had shot yesterday - fitted with new buddy strap  Did assess AROM -  4th and 5th WNL  Still pain with ADD and squeezing or pressure  Pt to hold off on tight grip or over using hand for few days - since shot yesterday                      OT Education - 12/21/15 0943    Education provided Yes   Education Details HEP   Person(s) Educated Patient   Methods Explanation;Demonstration;Tactile cues;Verbal cues   Comprehension Verbal cues required;Returned demonstration;Verbalized understanding          OT Short Term Goals - 12/21/15 1025    OT SHORT TERM GOAL #1   Title Pain on PRHWE improve with 15 points    Baseline 25/50 on  PRWHE  for pain at eval - pain decrease to 10    Status Achieved   OT SHORT TERM GOAL #2   Title Pt wrist AROM improve to WNL compareto L to push up from chair   Status Achieved           OT Long Term Goals - 12/21/15 1026    OT LONG TERM GOAL #1   Title Grip strength improve with 10 lbs to dig whole and use hand to hold steering wheel    Status Achieved   OT LONG TERM GOAL #2   Title Pt to be ind in HEP to decrease numbness in bilateral hands and decrease pain in 5th to greet people at church   Baseline still pain with greeting people    Status Partially Met               Plan - 12/21/15 0945    Clinical Impression Statement Pt was seen by ortho MD - had shot at 5th pulley - pt show this date AROM for fisting WNL - sitll pain with ADD of 5th and when greetting people /squeezing of hand - CT improved greatly - L clear and R still little bit of numbness at tip of thumb - pt  to hold of on any tight fist, over doing of uding or gripping  for few days after shot - pt  to phone  if need to see me - otherwise  can be followed by orthor    Rehab Potential Fair   OT Frequency Other (comment)   OT Treatment/Interventions Self-care/ADL training;Therapeutic exercise;Patient/family education;Splinting;Manual Therapy;Ultrasound;Passive range of motion;Contrast Bath;Fluidtherapy;Parrafin   Plan Pt to phone if need to see me - otherwise can be followed by ortho   OT Home Exercise Plan see pt instruction    Consulted and Agree with Plan of Care Patient      Patient will benefit from skilled therapeutic intervention in order to improve the following deficits and impairments:  Decreased range of motion, Impaired flexibility, Decreased strength, Pain, Impaired UE functional use, Increased edema  Visit Diagnosis: Muscle weakness (generalized)  Pain in right hand    Problem List Patient Active Problem List   Diagnosis Date Noted  . A-fib ([employer]) 12/22/2014  . 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 Acid reflux 12/22/2014  . BP (high blood pressure) 12/22/2014  . Chronic anemia 11/08/2014    Rosalyn Gess OTR/L,CLT  12/21/2015, 10:27 AM  [facility] [address], [place], [address] Phone: [phone]   Fax:  [phone]  Name: Joseph Houston MRN: [id number] Date of Birth: Mar 17, 1936

## 2016-01-02 DIAGNOSIS — R001 Bradycardia, unspecified: Secondary | ICD-10-CM | POA: Insufficient documentation

## 2016-04-09 DIAGNOSIS — M65351 Trigger finger, right little finger: Secondary | ICD-10-CM | POA: Insufficient documentation

## 2016-04-09 DIAGNOSIS — G5601 Carpal tunnel syndrome, right upper limb: Secondary | ICD-10-CM | POA: Insufficient documentation

## 2016-04-15 ENCOUNTER — Ambulatory Visit: Payer: Self-pay | Admitting: Urology

## 2016-07-02 ENCOUNTER — Other Ambulatory Visit: Payer: Self-pay | Admitting: Cardiology

## 2016-07-10 ENCOUNTER — Ambulatory Visit
Admission: RE | Admit: 2016-07-10 | Discharge: 2016-07-10 | Disposition: A | Discharge status: Home / Self Care | Payer: Medicare Other | Source: Ambulatory Visit | Attending: Cardiology | Admitting: Cardiology

## 2016-07-10 ENCOUNTER — Encounter
Admission: RE | Admit: 2016-07-10 | Discharge: 2016-07-10 | Disposition: A | Discharge status: Home / Self Care | Payer: Medicare Other | Source: Ambulatory Visit | Attending: Cardiology | Admitting: Cardiology

## 2016-07-10 DIAGNOSIS — Z882 Allergy status to sulfonamides status: Secondary | ICD-10-CM | POA: Insufficient documentation

## 2016-07-10 DIAGNOSIS — Z885 Allergy status to narcotic agent status: Secondary | ICD-10-CM | POA: Diagnosis not present

## 2016-07-10 DIAGNOSIS — Z01812 Encounter for preprocedural laboratory examination: Secondary | ICD-10-CM | POA: Insufficient documentation

## 2016-07-10 DIAGNOSIS — R9431 Abnormal electrocardiogram [ECG] [EKG]: Secondary | ICD-10-CM | POA: Diagnosis not present

## 2016-07-10 DIAGNOSIS — I440 Atrioventricular block, first degree: Secondary | ICD-10-CM | POA: Diagnosis not present

## 2016-07-10 DIAGNOSIS — I495 Sick sinus syndrome: Secondary | ICD-10-CM | POA: Diagnosis not present

## 2016-07-10 DIAGNOSIS — Z01818 Encounter for other preprocedural examination: Secondary | ICD-10-CM | POA: Diagnosis present

## 2016-07-10 HISTORY — DX: Prediabetes: R73.03

## 2016-07-10 HISTORY — DX: Personal history of urinary calculi: Z87.442

## 2016-07-10 HISTORY — DX: Headache, unspecified: R51.9

## 2016-07-10 HISTORY — DX: Dizziness and giddiness: R42

## 2016-07-10 HISTORY — DX: Personal history of other diseases of the digestive system: Z87.19

## 2016-07-10 HISTORY — DX: Headache: R51

## 2016-07-10 HISTORY — DX: Failed or difficult intubation, initial encounter: T88.4XXA

## 2016-07-10 LAB — BASIC METABOLIC PANEL
ANION GAP: 6 (ref 5–15)
BUN: 25 mg/dL — ABNORMAL HIGH (ref 6–20)
CALCIUM: 9 mg/dL (ref 8.9–10.3)
CO2: 27 mmol/L (ref 22–32)
Chloride: 104 mmol/L (ref 101–111)
Creatinine, Ser: 1.34 mg/dL — ABNORMAL HIGH (ref 0.61–1.24)
GFR, EST AFRICAN AMERICAN: 57 mL/min — AB (ref >60)
GFR, EST NON AFRICAN AMERICAN: 49 mL/min — AB (ref >60)
Glucose, Bld: 129 mg/dL — ABNORMAL HIGH (ref 65–99)
Potassium: 4.2 mmol/L (ref 3.5–5.1)
Sodium: 137 mmol/L (ref 135–145)

## 2016-07-10 LAB — CBC
HCT: 39.4 % — ABNORMAL LOW (ref 40.0–52.0)
Hemoglobin: 13.4 g/dL (ref 13.0–18.0)
MCH: 30 pg (ref 26.0–34.0)
MCHC: 34.1 g/dL (ref 32.0–36.0)
MCV: 87.9 fL (ref 80.0–100.0)
PLATELETS: 181 10*3/uL (ref 150–440)
RBC: 4.48 MIL/uL (ref 4.40–5.90)
RDW: 14.1 % (ref 11.5–14.5)
WBC: 7 10*3/uL (ref 3.8–10.6)

## 2016-07-10 LAB — PROTIME-INR
INR: 1.21
Prothrombin Time: 15.4 seconds — ABNORMAL HIGH (ref 11.4–15.2)

## 2016-07-10 LAB — SURGICAL PCR SCREEN
MRSA, PCR: NEGATIVE
STAPHYLOCOCCUS AUREUS: NEGATIVE

## 2016-07-10 LAB — APTT: APTT: 37 s — AB (ref 24–36)

## 2016-07-10 NOTE — Patient Instructions (Signed)
  Your procedure is scheduled on:Wednesday Dec. 13 , 2017. Report to Same Day Surgery. To find out your arrival time please call [phone] between 1PM - 3PM on Tuesday Dec. 12, 2017.  Remember: Instructions that are not followed completely may result in serious medical risk, up to and including death, or upon the discretion of your surgeon and anesthesiologist your surgery may need to be rescheduled.    _x___ 1. Do not eat food or drink liquids after midnight. No gum chewing or hard candies.     ____ 2. No Alcohol for 24 hours before or after surgery.   ____ 3. Bring all medications with you on the day of surgery if instructed.    __x__ 4. Notify your doctor if there is any change in your medical condition     (cold, fever, infections).    _____ 5. No smoking 24 hours prior to surgery.     Do not wear jewelry, make-up, hairpins, clips or nail polish.  Do not wear lotions, powders, or perfumes.   Do not shave 48 hours prior to surgery. Men may shave face and neck.  Do not bring valuables to the hospital.    [facility] is not responsible for any belongings or valuables.               Contacts, dentures or bridgework may not be worn into surgery.  Leave your suitcase in the car. After surgery it may be brought to your room.  For patients admitted to the hospital, discharge time is determined by your treatment team.   Patients discharged the day of surgery will not be allowed to drive home.    Please read over the following fact sheets that you were given:   [facility] Preparing for Surgery  ____ Take these medicines the morning of surgery with A SIP OF WATER:    1.  2.   3.   4.  5.  6.  ____ Fleet Enema (as directed)   _x_ Use CHG Soap as directed on instruction sheet  ____ Use inhalers on the day of surgery and bring to hospital day of surgery  ____ Stop metformin 2 days prior to surgery    ____ Take 1/2 of usual insulin dose the night before surgery and none on  the morning of surgery.   _x___ Check with Dr. Saralyn Pilar' office if and when to stop the Eliquis and what medications to take the morning of surgery.  _x___ Stop Anti-inflammatories such as Advil, Aleve, Ibuprofen, Motrin, Naproxen, Naprosyn, Goodies powders or aspirin products. OK to take Tylenol.   _x___ Stop supplements: omega-3 acid ethyl esters (LOVAZA) until after surgery.    ____ Bring C-Pap to the hospital.

## 2016-07-17 ENCOUNTER — Observation Stay: Payer: Medicare Other

## 2016-07-17 ENCOUNTER — Ambulatory Visit: Payer: Medicare Other

## 2016-07-17 ENCOUNTER — Ambulatory Visit
Admission: RE | Admit: 2016-07-17 | Discharge: 2016-07-18 | Disposition: A | Discharge status: Home / Self Care | Payer: Medicare Other | Source: Ambulatory Visit | Attending: Cardiology | Admitting: Cardiology

## 2016-07-17 ENCOUNTER — Ambulatory Visit: Payer: Medicare Other | Admitting: Anesthesiology

## 2016-07-17 ENCOUNTER — Encounter
Admission: RE | Disposition: A | Discharge status: Home / Self Care | Payer: Self-pay | Source: Ambulatory Visit | Attending: Cardiology

## 2016-07-17 ENCOUNTER — Encounter: Payer: Self-pay | Admitting: *Deleted

## 2016-07-17 DIAGNOSIS — Z79899 Other long term (current) drug therapy: Secondary | ICD-10-CM | POA: Insufficient documentation

## 2016-07-17 DIAGNOSIS — K219 Gastro-esophageal reflux disease without esophagitis: Secondary | ICD-10-CM | POA: Diagnosis not present

## 2016-07-17 DIAGNOSIS — I2510 Atherosclerotic heart disease of native coronary artery without angina pectoris: Secondary | ICD-10-CM | POA: Insufficient documentation

## 2016-07-17 DIAGNOSIS — I481 Persistent atrial fibrillation: Secondary | ICD-10-CM | POA: Insufficient documentation

## 2016-07-17 DIAGNOSIS — J449 Chronic obstructive pulmonary disease, unspecified: Secondary | ICD-10-CM | POA: Diagnosis not present

## 2016-07-17 DIAGNOSIS — R7303 Prediabetes: Secondary | ICD-10-CM | POA: Diagnosis not present

## 2016-07-17 DIAGNOSIS — I10 Essential (primary) hypertension: Secondary | ICD-10-CM | POA: Diagnosis not present

## 2016-07-17 DIAGNOSIS — Z950 Presence of cardiac pacemaker: Secondary | ICD-10-CM

## 2016-07-17 DIAGNOSIS — N400 Enlarged prostate without lower urinary tract symptoms: Secondary | ICD-10-CM | POA: Diagnosis not present

## 2016-07-17 DIAGNOSIS — Z7901 Long term (current) use of anticoagulants: Secondary | ICD-10-CM | POA: Insufficient documentation

## 2016-07-17 DIAGNOSIS — Z7982 Long term (current) use of aspirin: Secondary | ICD-10-CM | POA: Diagnosis not present

## 2016-07-17 DIAGNOSIS — I495 Sick sinus syndrome: Secondary | ICD-10-CM | POA: Diagnosis present

## 2016-07-17 DIAGNOSIS — Z87891 Personal history of nicotine dependence: Secondary | ICD-10-CM | POA: Insufficient documentation

## 2016-07-17 DIAGNOSIS — M1990 Unspecified osteoarthritis, unspecified site: Secondary | ICD-10-CM | POA: Diagnosis not present

## 2016-07-17 DIAGNOSIS — I480 Paroxysmal atrial fibrillation: Secondary | ICD-10-CM | POA: Insufficient documentation

## 2016-07-17 HISTORY — DX: Chronic obstructive pulmonary disease, unspecified: J44.9

## 2016-07-17 HISTORY — PX: PACEMAKER INSERTION: SHX728

## 2016-07-17 HISTORY — DX: Dyspnea, unspecified: R06.00

## 2016-07-17 LAB — GLUCOSE, CAPILLARY
GLUCOSE-CAPILLARY: 133 mg/dL — AB (ref 65–99)
Glucose-Capillary: 100 mg/dL — ABNORMAL HIGH (ref 65–99)

## 2016-07-17 SURGERY — INSERTION, CARDIAC PACEMAKER
Anesthesia: General | Site: Shoulder | Laterality: Left | Wound class: Clean

## 2016-07-17 MED ORDER — FENTANYL CITRATE (PF) 100 MCG/2ML IJ SOLN
INTRAMUSCULAR | Status: AC
  Administered 2016-07-17: 50 ug via INTRAVENOUS
  Filled 2016-07-17: qty 2

## 2016-07-17 MED ORDER — PHENYLEPHRINE HCL 10 MG/ML IJ SOLN
INTRAMUSCULAR | Status: DC | PRN
  Administered 2016-07-17: 100 ug via INTRAVENOUS

## 2016-07-17 MED ORDER — SODIUM CHLORIDE 0.9 % IR SOLN
Status: DC | PRN
  Administered 2016-07-17: 200 mL

## 2016-07-17 MED ORDER — SODIUM CHLORIDE 0.9 % IV SOLN: INTRAVENOUS | Status: DC

## 2016-07-17 MED ORDER — FAMOTIDINE 20 MG PO TABS
20.0000 mg | ORAL_TABLET | Freq: Once | ORAL | Status: AC
  Administered 2016-07-17: 20 mg via ORAL

## 2016-07-17 MED ORDER — OXYCODONE HCL 5 MG PO TABS
5.0000 mg | ORAL_TABLET | Freq: Four times a day (QID) | ORAL | Status: DC | PRN
  Administered 2016-07-17: 5 mg via ORAL
  Filled 2016-07-17: qty 1

## 2016-07-17 MED ORDER — LIDOCAINE 1 % OPTIME INJ - NO CHARGE
INTRAMUSCULAR | Status: DC | PRN
  Administered 2016-07-17: 28 mL

## 2016-07-17 MED ORDER — SODIUM CHLORIDE 0.9 % IV SOLN
INTRAVENOUS | Status: DC
  Administered 2016-07-17: 13:00:00 via INTRAVENOUS
  Administered 2016-07-17 (×2): 50 mL/h via INTRAVENOUS

## 2016-07-17 MED ORDER — LIDOCAINE 2% (20 MG/ML) 5 ML SYRINGE
INTRAMUSCULAR | Status: DC | PRN
  Administered 2016-07-17: 40 mg via INTRAVENOUS

## 2016-07-17 MED ORDER — FAMOTIDINE 20 MG PO TABS
ORAL_TABLET | ORAL | Status: AC
  Administered 2016-07-17: 20 mg via ORAL
  Filled 2016-07-17: qty 1

## 2016-07-17 MED ORDER — METOPROLOL SUCCINATE ER 25 MG PO TB24
25.0000 mg | ORAL_TABLET | ORAL | Status: AC
  Administered 2016-07-17: 25 mg via ORAL
  Filled 2016-07-17: qty 1

## 2016-07-17 MED ORDER — CEFAZOLIN SODIUM-DEXTROSE 2-4 GM/100ML-% IV SOLN
INTRAVENOUS | Status: AC
  Administered 2016-07-17: 2 g via INTRAVENOUS
  Filled 2016-07-17: qty 100

## 2016-07-17 MED ORDER — CEFAZOLIN IN D5W 1 GM/50ML IV SOLN
1.0000 g | Freq: Four times a day (QID) | INTRAVENOUS | Status: AC
  Administered 2016-07-17 – 2016-07-18 (×3): 1 g via INTRAVENOUS
  Filled 2016-07-17 (×4): qty 50

## 2016-07-17 MED ORDER — SODIUM CHLORIDE 0.9 % IR SOLN: Freq: Once | Status: DC

## 2016-07-17 MED ORDER — FENTANYL CITRATE (PF) 100 MCG/2ML IJ SOLN
INTRAMUSCULAR | Status: DC | PRN
  Administered 2016-07-17: 50 ug via INTRAVENOUS

## 2016-07-17 MED ORDER — SODIUM CHLORIDE 0.9 % IJ SOLN
INTRAMUSCULAR | Status: AC
  Filled 2016-07-17: qty 50

## 2016-07-17 MED ORDER — CHLORHEXIDINE GLUCONATE 4 % EX LIQD: 60.0000 mL | Freq: Once | CUTANEOUS | Status: DC

## 2016-07-17 MED ORDER — PROPOFOL 500 MG/50ML IV EMUL
INTRAVENOUS | Status: DC | PRN
  Administered 2016-07-17: 80 ug/kg/min via INTRAVENOUS

## 2016-07-17 MED ORDER — FLECAINIDE ACETATE 100 MG PO TABS
100.0000 mg | ORAL_TABLET | Freq: Two times a day (BID) | ORAL | Status: DC
  Administered 2016-07-17 – 2016-07-18 (×2): 100 mg via ORAL
  Filled 2016-07-17 (×3): qty 1

## 2016-07-17 MED ORDER — LOSARTAN POTASSIUM 50 MG PO TABS
100.0000 mg | ORAL_TABLET | Freq: Every day | ORAL | Status: DC
  Administered 2016-07-17 – 2016-07-18 (×2): 100 mg via ORAL
  Filled 2016-07-17 (×2): qty 2

## 2016-07-17 MED ORDER — METOPROLOL SUCCINATE ER 50 MG PO TB24
50.0000 mg | ORAL_TABLET | Freq: Every day | ORAL | Status: DC
  Administered 2016-07-18: 50 mg via ORAL
  Filled 2016-07-17: qty 1

## 2016-07-17 MED ORDER — ACETAMINOPHEN 325 MG PO TABS: 325.0000 mg | ORAL_TABLET | ORAL | Status: DC | PRN

## 2016-07-17 MED ORDER — SODIUM CHLORIDE 0.9 % IR SOLN: 80.0000 mg | Status: DC

## 2016-07-17 MED ORDER — ONDANSETRON HCL 4 MG/2ML IJ SOLN: 4.0000 mg | Freq: Four times a day (QID) | INTRAMUSCULAR | Status: DC | PRN

## 2016-07-17 MED ORDER — MIDAZOLAM HCL 5 MG/5ML IJ SOLN
INTRAMUSCULAR | Status: DC | PRN
  Administered 2016-07-17: 2 mg via INTRAVENOUS

## 2016-07-17 MED ORDER — AMLODIPINE BESYLATE 5 MG PO TABS
5.0000 mg | ORAL_TABLET | Freq: Every day | ORAL | Status: DC
  Administered 2016-07-18: 5 mg via ORAL
  Filled 2016-07-17 (×2): qty 1

## 2016-07-17 MED ORDER — GENTAMICIN SULFATE 40 MG/ML IJ SOLN
INTRAMUSCULAR | Status: AC
  Filled 2016-07-17: qty 4

## 2016-07-17 MED ORDER — CEFAZOLIN SODIUM-DEXTROSE 2-4 GM/100ML-% IV SOLN
2.0000 g | INTRAVENOUS | Status: AC
  Administered 2016-07-17: 2 g via INTRAVENOUS

## 2016-07-17 MED ORDER — FENTANYL CITRATE (PF) 100 MCG/2ML IJ SOLN
25.0000 ug | INTRAMUSCULAR | Status: DC | PRN
  Administered 2016-07-17: 50 ug via INTRAVENOUS

## 2016-07-17 MED ORDER — METOPROLOL SUCCINATE ER 25 MG PO TB24
25.0000 mg | ORAL_TABLET | Freq: Every day | ORAL | Status: DC
  Administered 2016-07-17: 25 mg via ORAL
  Filled 2016-07-17 (×3): qty 1

## 2016-07-17 MED ORDER — TRAMADOL HCL 50 MG PO TABS
50.0000 mg | ORAL_TABLET | Freq: Two times a day (BID) | ORAL | Status: DC | PRN
  Administered 2016-07-17 – 2016-07-18 (×2): 50 mg via ORAL
  Filled 2016-07-17 (×3): qty 1

## 2016-07-17 SURGICAL SUPPLY — 37 items
BAG DECANTER FOR FLEXI CONT (MISCELLANEOUS) ×3 IMPLANT
BRUSH SCRUB 4% CHG (MISCELLANEOUS) ×3 IMPLANT
CABLE SURG 12 DISP A/V CHANNEL (MISCELLANEOUS) ×3 IMPLANT
CANISTER SUCT 1200ML W/VALVE (MISCELLANEOUS) ×3 IMPLANT
CHLORAPREP W/TINT 26ML (MISCELLANEOUS) ×3 IMPLANT
COVER LIGHT HANDLE STERIS (MISCELLANEOUS) ×6 IMPLANT
COVER MAYO STAND STRL (DRAPES) ×3 IMPLANT
DEVICE DISSECT PLASMABLAD 3.0S (MISCELLANEOUS) IMPLANT
DRAPE C-ARM XRAY 36X54 (DRAPES) ×3 IMPLANT
DRESSING TELFA 4X3 1S ST N-ADH (GAUZE/BANDAGES/DRESSINGS) ×3 IMPLANT
DRSG TEGADERM 4X4.75 (GAUZE/BANDAGES/DRESSINGS) ×3 IMPLANT
ELECT REM PT RETURN 9FT ADLT (ELECTROSURGICAL) ×3
ELECTRODE REM PT RTRN 9FT ADLT (ELECTROSURGICAL) ×1 IMPLANT
GLOVE BIO SURGEON STRL SZ7.5 (GLOVE) ×3 IMPLANT
GLOVE BIO SURGEON STRL SZ8 (GLOVE) ×3 IMPLANT
GOWN STRL REUS W/ TWL LRG LVL3 (GOWN DISPOSABLE) ×1 IMPLANT
GOWN STRL REUS W/ TWL XL LVL3 (GOWN DISPOSABLE) ×1 IMPLANT
GOWN STRL REUS W/TWL LRG LVL3 (GOWN DISPOSABLE) ×2
GOWN STRL REUS W/TWL XL LVL3 (GOWN DISPOSABLE) ×2
IMMOBILIZER SHDR MD LX WHT (SOFTGOODS) IMPLANT
IMMOBILIZER SHDR XL LX WHT (SOFTGOODS) ×3 IMPLANT
INTRO PACEMAKR LEAD 9FR 13CM (INTRODUCER) ×3
INTRO PACEMKR SHEATH II 7FR (MISCELLANEOUS) ×3
INTRODUCER PACEMKR LD 9FR 13CM (INTRODUCER) ×1 IMPLANT
INTRODUCER PACEMKR SHTH II 7FR (MISCELLANEOUS) ×1 IMPLANT
IV NS 500ML (IV SOLUTION) ×2
IV NS 500ML BAXH (IV SOLUTION) ×1 IMPLANT
KIT RM TURNOVER STRD PROC AR (KITS) ×3 IMPLANT
LABEL OR SOLS (LABEL) ×3 IMPLANT
LEAD CAPSURE NOVUS 5076-52CM (Lead) ×3 IMPLANT
LEAD CAPSURE NOVUS 5076-58CM (Lead) ×3 IMPLANT
MARKER SKIN DUAL TIP RULER LAB (MISCELLANEOUS) ×3 IMPLANT
PACK PACE INSERTION (MISCELLANEOUS) ×3 IMPLANT
PAD ONESTEP ZOLL R SERIES ADT (MISCELLANEOUS) ×3 IMPLANT
PLASMABLADE 3.0S (MISCELLANEOUS)
PPM ADVISA MRI DR A2DR01 (Pacemaker) ×3 IMPLANT
SUT SILK 0 SH 30 (SUTURE) ×9 IMPLANT

## 2016-07-17 NOTE — Progress Notes (Signed)
Patient is alert and oriented.  BP more controlled after additional metoprolol was given.  Patient having some intermittent arm pain which is controlled with PRN tramadol.  Scant drainage on pacemaker dressing has been marked with marker.  Continue to monitor.  Family at bedside and wife will be staying the night tonight.  Planned discharge in the AM.

## 2016-07-17 NOTE — H&P (Signed)
<6>42349-1<7>Reason for Visit<6>29299-5<7>Encounter Details<6>46240-8<7>Social History<6>29762-2<7>Last Filed Vital Signs<6>8716-3<7>Progress Notes<6>10164-2<7>Plan of Treatment<6>18776-5<7>ECG Results<6>18844-1<7>Visit Diagnoses<6>51848-0<7>/"> Jump to Section ? Document InformationECG ResultsEncounter DetailsLast Filed Vital SignsPatient DemographicsPlan of TreatmentProgress NotesReason for ReferralReason for VisitSocial HistoryVisit Diagnoses [employer], generated on Dec. 13, 2017 Printout Information  Document Contents Office Visit Document Received Date Dec. 13, 2017 [place]   Patient Demographics - 80 y.o. Male, born Dec 11, 1935   Patient Address Communication Language Race / Ethnicity  [place], [place] [address] [phone] [facility]) [phone] (Mobile) English (Preferred) White / Not Hispanic or Latino  Reason for Referral   Procedure (Routine) Procedure (Routine)  Status Reason Specialty Diagnoses / Procedures Referred By Contact Referred To Contact  Pending Review   Diagnoses  Persistent atrial fibrillation (CMS-HCC)    Procedures  ECG 12-lead  Fath, Aloha Gell, MD  [place]  [place], [place] [address]  Phone: [phone]  Fax: [phone]        Reason for Visit    Reason Comments  Follow-up 6 months Im doing okay ready for the pacemaker    Encounter Details    Date Type Department Care Team Description  07/02/2016 Office Visit [facility]  [place], [place] [address]  [phone]  Sydnee Levans, MD  [address]  [place],  [address]  [phone]  [phone] (Fax)  Essential hypertension (Primary Dx);  Persistent atrial fibrillation (CMS-HCC);  Sinus pause   Social History - as of this  encounter   Tobacco Use Types Packs/Day Years Used Date  Former Smoker    Quit: 08/05/1965  Smokeless Tobacco: Never Used      Alcohol Use Drinks/Week oz/Week Comments  No 0 Standard drinks or equivalent  0.0     Sex Assigned at Birth Date Recorded  Not on file    Last Filed Vital Signs - in this encounter   Vital Sign Reading Time Taken  Blood Pressure 142/76 07/02/2016 9:33 AM EST  Pulse 80 07/02/2016 9:33 AM EST  Temperature - -  Respiratory Rate 12 07/02/2016 9:33 AM EST  Oxygen Saturation - -  Inhaled Oxygen Concentration - -  Weight 113.9 kg (251 lb) 07/02/2016 9:33 AM EST  Height 180.3 cm (5\' 11" ) 07/02/2016 9:33 AM EST  Body Mass Index 35.01 07/02/2016 9:33 AM EST   Progress Notes - in this encounter   Sydnee Levans, MD - 07/02/2016 9:15 AM EST Formatting of this note may be different from the original.   Chief Complaint: Chief Complaint  Patient presents with  . Follow-up  6 months Im doing okay ready for the pacemaker  Date of Service: 07/02/2016 Date of Birth: Feb 22, 1936 PCP: Azzie Glatter, MD  History of Present Illness: Joseph Houston is a 80 y.o.male patient who returns for a walk-in visit. Patient has a history of atrial fibrillation which was cardioverted to sinus rhythm and he is currently on flecainide and anticoagulated with apixaban. He is also on rate control with metoprolol. Patient is had episodes of lightheadedness on occasion. He also is somewhat more hypertensive. He was taken off of metoprolol due to the bradycardia. Dizzy spells prompted a Holter monitor which revealed approximately 50 greater than 2 second pauses. The longest RR interval was 3.7 seconds during sleeping hours. This is likely secondary to sleep apnea however he did have a 2.5 second pause during waking hours. He has  no syncope but has frequent episodes of brief lightheadedness correlating to his pauses. He is on apixaban for anticoagulation and on  flecainide for rhythm control. I discussed consideration for permanent pacemaker. Patient had deferred the insertion of the pacemaker although now is continuing to have episodes of dizziness and near syncope and would like to proceed with a permanent pacemaker. EKG today shows sinus rhythm at a rate of 81 bpm with a PR interval of 278 ms, QRS duration of 180 ms with a QTC of 457 ms and a QRS axis of -55. There is no ischemia. Past Medical and Surgical History  Past Medical History Past Medical History:  Diagnosis Date  . Arthritis  . Atrial fibrillation , unspecified (CMS-HCC)  . BPH (benign prostatic hypertrophy)  . CAD (coronary artery disease)  . GERD (gastroesophageal reflux disease) 2000  . Hypertension  . 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   Medications and Allergies  Current Medications  Current Outpatient Prescriptions  Medication Sig Dispense Refill  . acetaminophen (TYLENOL) 650 MG ER tablet Take 1,300 mg by mouth every 8 (eight) hours as needed for Pain.  [name] apixaban (ELIQUIS) 5 mg tablet Take 1 tablet (5 mg total) by mouth 2 (two) times daily. 60 tablet 11  . aspirin 81 MG chewable tablet Take 81 mg by mouth once daily.  . clotrimazole (LOTRIMIN) 1 % cream Apply topically 2 (two) times daily. 60 g 2  . EPINEPHrine (EPIPEN 2-PAK) 0.3 mg/0.3 mL pen injector Inject 0.3 mg into the muscle as needed. Reported on 11/29/2015  . ferrous sulfate 325 (65 FE) MG EC tablet Take 1 tablet (325 mg total) by mouth daily with breakfast. 30 tablet 11  . flecainide (TAMBOCOR) 100 MG tablet Take 1 tablet (100 mg total) by mouth 2 (two) times daily. 60 tablet 5  . fluticasone (FLONASE) 50 mcg/actuation nasal spray  Place 2 sprays into both nostrils as needed.   [name] losartan (COZAAR) 100 MG tablet Take 1 tablet (100 mg total) by mouth once daily. 30 tablet 5  . meclizine (ANTIVERT) 12.5 mg tablet Take 1 tablet (12.5 mg total) by mouth 3 (three) times daily as needed (motion sickness). 30 tablet 2  . meclizine (ANTIVERT) 25 mg tablet TAKE ONE TABLET BY MOUTH THREE TIMES DAILY AS NEEDED FOR DIZZINESS FOR UP TO 10 DAYS 60 tablet 3  . methylPREDNISolone (MEDROL DOSEPACK) 4 mg tablet Follow package directions. 21 tablet 0  . omega-3 fatty acids-vitamin E (FISH OIL) 1,000 mg Take 3 capsules by mouth once daily.  [name] omeprazole (PRILOSEC) 20 MG DR capsule Take 1 capsule (20 mg total) by mouth 2 (two) times daily. 60 capsule 5  . traMADol (ULTRAM) 50 mg tablet 1 Tab po bid 60 tablet 2   No current facility-administered medications for this visit.   Allergies: Codeine sulfate and Sulfa (sulfonamide antibiotics)  Social and Family History  Social History reports that he quit smoking about 50 years ago. He has never used smokeless tobacco. He reports that he does not drink alcohol.  Family History Family History  Problem Relation Age of Onset  . Alzheimer's disease Mother  . No Known Problems Father  . Myocardial Infarction (Heart attack) Brother  . Allergies Other  Sibling  . Asthma Other  Sibling   Review of Systems  Review of Systems  Constitutional: Negative for chills, diaphoresis, fever and weight loss.  HENT: Negative for congestion, ear discharge, hearing loss and tinnitus.  Eyes: Negative for blurred vision.  Respiratory: Negative for cough, hemoptysis, sputum production, shortness of breath and wheezing.  Cardiovascular: Negative for chest pain, palpitations, orthopnea, claudication, leg swelling and PND.  Gastrointestinal: Negative for abdominal pain, blood in stool, constipation, diarrhea, heartburn, melena, nausea and vomiting.  Genitourinary: Negative for dysuria, frequency, hematuria and  urgency.  Musculoskeletal: Negative for back pain, falls, joint pain and myalgias.  Skin: Negative for itching and rash.  Neurological: Positive for dizziness and weakness. Negative for tingling, focal weakness, loss of consciousness and headaches.  Endo/Heme/Allergies: Negative for polydipsia. Does not bruise/bleed easily.  Psychiatric/Behavioral: Negative for depression, memory loss and substance abuse. The patient is not nervous/anxious.    Physical Examination   Vitals: BP 142/76  Pulse 80  Resp 12  Ht 180.3 cm (5\' 11" )  Wt (!) 113.9 kg (251 lb)  BMI 35.01 kg/m  Ht:180.3 cm (5\' 11" ) Wt:(!) 113.9 kg (251 lb) [id number] surface area is 2.39 meters squared. Body mass index is 35.01 kg/m.  Wt Readings from Last 3 Encounters:  07/02/16 (!) 113.9 kg (251 lb)  05/15/16 (!) 116.6 kg (257 lb)  04/25/16 (!) 119.7 kg (264 lb)   BP Readings from Last 3 Encounters:  07/02/16 142/76  05/15/16 (!) 150/92  04/25/16 140/80   General appearance appears in no acute distress  Head Mouth and Eye exam Normocephalic, without obvious abnormality, atraumatic Dentition is good Eyes appear anicteric   Neck exam Thyroid: normal  Nodes: no obvious adenopathy  LUNGS Breath Sounds: Normal Percussion: Normal  CARDIOVASCULAR JVP CV wave: no HJR: no Elevation at 90 degrees: None Carotid Pulse: normal pulsation bilaterally Bruit: None Apex: apical impulse normal  Auscultation Rhythm: normal sinus rhythm S1: normal S2: normal Clicks: no Rub: no Murmurs: no murmurs  Gallop: None ABDOMEN Liver enlargement: no Pulsatile aorta: no Ascites: no Bruits: no  EXTREMITIES Clubbing: no Edema: 1+ bilateral pedal edema Pulses: peripheral pulses symmetrical Femoral Bruits: no Amputation: no SKIN Rash: no Cyanosis: no Embolic phemonenon: no Bruising: no NEURO Alert and Oriented to person, place and time: yes Non focal: yes  PSYCH: Pt appears to have normal affect  LABS  REVIEWED Last 3 CBC results: Lab Results  Component Value Date  WBC 5.9 04/15/2016  WBC 9.0 03/19/2016  WBC 7.0 10/11/2015   Lab Results  Component Value Date  HGB 13.7 (L) 04/15/2016  HGB 14.2 03/19/2016  HGB 13.2 (L) 10/11/2015   Lab Results  Component Value Date  HCT 42.9 04/15/2016  HCT 42.9 03/19/2016  HCT 40.7 10/11/2015   Lab Results  Component Value Date  PLT 218 04/15/2016  PLT 230 03/19/2016  PLT 205 10/11/2015   Lab Results  Component Value Date  CREATININE 1.4 (H) 04/15/2016  BUN 21 04/15/2016  NA 141 04/15/2016  K 4.7 04/15/2016  CL 105 04/15/2016  CO2 29.3 04/15/2016   Lab Results  Component Value Date  HGBA1C 6.6 (H) 11/08/2014   Lab Results  Component Value Date  HDL 31.0 04/15/2016  HDL 30.0 10/11/2015  HDL 40.3 04/25/2014   Lab Results  Component Value Date  LDLCALC 100 04/15/2016  LDLCALC 103 10/11/2015  LDLCALC 100 04/25/2014   Lab Results  Component Value Date  TRIG 169 04/15/2016  TRIG 99 10/11/2015  TRIG 81 04/25/2014   Lab Results  Component Value Date  ALT 18 04/15/2016  AST 19 04/15/2016  ALKPHOS 54 04/15/2016   Lab Results  Component Value Date  TSH 0.659 04/15/2016   Assessment and Plan   80 y.o. male with  ICD-10-CM ICD-9-CM  1. Paroxysmal atrial fibrillation-currently in normal sinus rhythm after cardioversion. Remains in sinus rhythm on flecainide. Heart rate has picked up since stopping metoprolol. We discussed the results of the Holter monitor including the pauses. Patient has decided to proceed with permanent pacemaker. Risks and benefits were discussed. Will discontinue Eliquis 2 days prior to the procedure. I48.0 427.31     2. Essential hypertension, hypertension with unspecified goal-blood pressure is somewhat elevated on current regimen however it is better at home.Dash diet is recommended I10 401.9  3. Gastroesophageal reflux disease, esophagitis presence not specified K21.9 530.81   Return in about 3  weeks (around 07/23/2016).  These notes generated with voice recognition software. I apologize for typographical errors.  Sydnee Levans, MD      Plan of Treatment - as of this encounter   Upcoming Encounters Upcoming Encounters  Date Type Specialty Care Team Description  07/22/2016 Office Visit Cardiology Fath, Aloha Gell, MD  [facility] [place]  [place], [place] [address]  [phone]  [phone] (Fax)    08/12/2016 Ancillary Orders Lab Azzie Glatter, MD  [address]  [place], [place] [address]  [phone]  [phone] ([phone]    08/19/2016 Office Visit Internal Medicine Azzie Glatter, MD  [address]  [place], [place] [address]  [phone]  [phone] (Fax)     Scheduled Tests Scheduled Tests  Name Priority Associated Diagnoses Order Schedule  Basic Metabolic Panel (BMP) Routine Persistent atrial fibrillation (CMS-HCC)  Ordered: 07/02/2016  CBC w/auto Differential (5 Part) Routine Persistent atrial fibrillation (CMS-HCC)  Ordered: 07/02/2016   ECG Results - in this encounter    ECG 12-lead (07/02/2016 9:29 AM) ECG 12-lead (07/02/2016 9:29 AM)  Component Value Ref Range  Vent Rate (bpm) 85   QRS Interval (msec) 110   QT Interval (msec) 400   QTc (msec) 476    ECG 12-lead (07/02/2016 9:29 AM)  Specimen Performing Laboratory   [facility] GE MUSE RESULTS    ECG 12-lead (07/02/2016 9:29 AM)  Narrative  Normal sinus rhythm  with occasional ventricular-paced complexes  Left axis deviation      When compared with ECG of 25-Apr-2016 10:40,  Electronic ventricular pacemaker has replaced Sinus rhythm  I reviewed and concur with this report. Electronically signed [id number] MD, KEN [phone]) on 07/09/2016 12:14:56 PM     Visit Diagnoses    Diagnosis  Essential hypertension - Primary  Persistent  atrial fibrillation (CMS-HCC)  Atrial fibrillation   Sinus pause   Images Document Information   Primary Care Provider Azzie Glatter MD (Apr. 06, 2015 - Present) [phone] (Work) [phone] (Fax) [address] [place], [place] [address]  Document Coverage Dates Nov. 28, 2017  [place] [phone] (Work) [place], [place] [address]   Encounter Providers Sydnee Levans MD (Attending) [phone] (Work) [phone] (Fax) [place], [place] [address]   Encounter Date Nov. 28, 2017   Show All Sections

## 2016-07-17 NOTE — Anesthesia Postprocedure Evaluation (Signed)
Anesthesia Post Note  Patient: Joseph Houston  Procedure(s) Performed: Procedure(s) (LRB): INSERTION PACEMAKER (Left)  Patient location during evaluation: PACU Anesthesia Type: General Level of consciousness: awake and alert and oriented Pain management: pain level controlled Vital Signs Assessment: post-procedure vital signs reviewed and stable Respiratory status: spontaneous breathing, nonlabored ventilation and respiratory function stable Cardiovascular status: blood pressure returned to baseline and stable Postop Assessment: no signs of nausea or vomiting Anesthetic complications: no    Last Vitals:  Vitals:   07/17/16 1413 07/17/16 1416  BP:    Pulse: 71 72  Resp: 15 11  Temp:      Last Pain:  Vitals:   07/17/16 1408  TempSrc:   PainSc: 0-No pain                 [name]

## 2016-07-17 NOTE — Anesthesia Preprocedure Evaluation (Signed)
Anesthesia Evaluation  Patient identified by MRN, date of birth, ID band Patient awake    Reviewed: Allergy & Precautions, NPO status , Patient's Chart, lab work & pertinent test results  History of Anesthesia Complications Negative for: history of anesthetic complications  Airway Mallampati: III  TM Distance: >3 FB Neck ROM: Full    Dental  (+) Upper Dentures, Lower Dentures   Pulmonary shortness of breath, COPD,  COPD inhaler, former smoker,    breath sounds clear to auscultation- rhonchi (-) wheezing      Cardiovascular Exercise Tolerance: Good hypertension, + CAD  (-) Past MI and (-) Cardiac Stents + dysrhythmias (paroxysmal) Atrial Fibrillation  Rhythm:Regular Rate:Normal - Systolic murmurs and - Diastolic murmurs    Neuro/Psych  Headaches, negative psych ROS   GI/Hepatic Neg liver ROS, hiatal hernia, GERD  ,  Endo/Other  negative endocrine ROSneg diabetes  Renal/GU negative Renal ROS     Musculoskeletal  (+) Arthritis ,   Abdominal (+) + obese,   Peds  Hematology  (+) anemia ,   Anesthesia Other Findings Past Medical History: No date: Anemia     Comment: was treated in the past, not now No date: Arthritis     Comment: knees No date: BPH (benign prostatic hypertrophy) No date: COPD (chronic obstructive pulmonary disease) (*     Comment: has had most of his life. has not used an               inhaler for years No date: Coronary artery disease No date: Difficult intubation     Comment: pt reports bad sore throat after surgery. No date: Dyspnea 2016: Dysrhythmia No date: GERD (gastroesophageal reflux disease) No date: Headache No date: History of hiatal hernia No date: History of kidney stones No date: Hypertension No date: Pre-diabetes 1992: Vertigo   Reproductive/Obstetrics                            Anesthesia Physical Anesthesia Plan  ASA: III  Anesthesia Plan:  General   Post-op Pain Management:    Induction: Intravenous  Airway Management Planned: Natural Airway  Additional Equipment:   Intra-op Plan:   Post-operative Plan:   Informed Consent: I have reviewed the patients History and Physical, chart, labs and discussed the procedure including the risks, benefits and alternatives for the proposed anesthesia with the patient or authorized representative who has indicated his/her understanding and acceptance.   Dental advisory given  Plan Discussed with: CRNA and Anesthesiologist  Anesthesia Plan Comments:         Anesthesia Quick Evaluation

## 2016-07-17 NOTE — Op Note (Signed)
[facility] Cardiology   07/17/2016                     1:50 PM  PATIENT:  Joseph Houston    PRE-OPERATIVE DIAGNOSIS:  prolonged pauses  POST-OPERATIVE DIAGNOSIS:  Same  PROCEDURE:  INSERTION PACEMAKER  SURGEON:  Isaias Cowman, MD    ANESTHESIA:     PREOPERATIVE INDICATIONS:  Joseph Houston is a  80 y.o. male with a diagnosis of prolonged pauses who failed conservative measures and elected for surgical management.    The risks benefits and alternatives were discussed with the patient preoperatively including but not limited to the risks of infection, bleeding, cardiopulmonary complications, the need for revision surgery, among others, and the patient was willing to proceed.   OPERATIVE PROCEDURE: The patient was brought to the operating room fasting state. The left pectoral region was prepped and draped in the usual sterile manner. Anesthesia was obtained 1% lidocaine locally. A 6 cm incision was performed a left pectoral region. The pacemaker pocket was generated electrocautery and blunt dissection. Access was obtained to left subclavian vein by fine-needle aspiration. MRI compatible leads were positioned right ventricular apical septum and right atrial appendage under fluoroscopic guidance. After proper thresholds were obtained the leads were sutured in place. The leads were connected to a MRI compatible dual chamber rate responsive pacemaker generator ([employer] [id number]). The pacemaker pocket was irrigated with gentamicin solution. The new pacemaker generator was positioned into the pocket and the pocket was closed with 2-0 and 4-0 Vicryl, respectively. Steri-Strips and pressure dressing were applied.

## 2016-07-17 NOTE — Transfer of Care (Signed)
Immediate Anesthesia Transfer of Care Note  Patient: Joseph Houston  Procedure(s) Performed: Procedure(s): INSERTION PACEMAKER (Left)  Patient Location: PACU  Anesthesia Type:General  Level of Consciousness: awake, oriented and patient cooperative  Airway & Oxygen Therapy: Patient Spontanous Breathing and Patient connected to face mask oxygen  Post-op Assessment: Report given to RN and Post -op Vital signs reviewed and stable  Post vital signs: Reviewed and stable  Last Vitals:  Vitals:   07/17/16 1132  Pulse: 81  Resp: 17  Temp: 36.4 C    Last Pain:  Vitals:   07/17/16 1132  TempSrc: Tympanic  PainSc: 0-No pain      Patients Stated Pain Goal: 0 ([id number])  Complications: No apparent anesthesia complications

## 2016-07-17 NOTE — Interval H&P Note (Signed)
History and Physical Interval Note:  07/17/2016 11:38 AM  Joseph Houston  has presented today for surgery, with the diagnosis of prolonged pauses  The various methods of treatment have been discussed with the patient and family. After consideration of risks, benefits and other options for treatment, the patient has consented to  Procedure(s): INSERTION PACEMAKER (N/A) as a surgical intervention .  The patient's history has been reviewed, patient examined, no change in status, stable for surgery.  I have reviewed the patient's chart and labs.  Questions were answered to the patient's satisfaction.     Joseph Houston [employer]

## 2016-07-18 ENCOUNTER — Encounter: Payer: Self-pay | Admitting: Cardiology

## 2016-07-18 DIAGNOSIS — I480 Paroxysmal atrial fibrillation: Secondary | ICD-10-CM | POA: Diagnosis not present

## 2016-07-18 MED ORDER — CEPHALEXIN 250 MG PO CAPS: 250.0000 mg | ORAL_CAPSULE | Freq: Four times a day (QID) | ORAL | 0 refills | Status: DC

## 2016-07-18 MED ORDER — AMLODIPINE BESYLATE 5 MG PO TABS: 5.0000 mg | ORAL_TABLET | Freq: Every day | ORAL | 5 refills | Status: DC

## 2016-07-18 NOTE — Discharge Instructions (Signed)
Do not lift left arm above head. Do not lift greater than 15 lbs. Leave steri strips on. May shower 07/19/2016. Resume Eliquis 07/20/2016.

## 2016-07-18 NOTE — Care Management (Signed)
No discharge needs identified by members of care team 

## 2016-07-18 NOTE — Progress Notes (Signed)
IV and tele removed from patient. Discharge instructions given to patient along with hard copy prescriptions. Patient verbalized understanding. No distress at this time. Wife is at bedside and will be transporting patient home.

## 2016-07-18 NOTE — Discharge Summary (Signed)
Physician Discharge Summary  Patient ID: Joseph Houston MRN: [id number] DOB/AGE: [birth year] 80 y.o.  Admit date: 07/17/2016 Discharge date: 07/18/2016  Primary Discharge Diagnosis Prolonged pauses Secondary Discharge Diagnosis Prolonged pauses  Significant Diagnostic Studies: radiology: X-Ray: no pneumothorax and yes  Consults: None  Hospital Course: Elective dual-chamber pacemaker without peri-procedural complications. Patient returned to telemetry and had an uncomplicated hospital course with the exception of elevated blood pressure on the morning of 07/18/2016 that improved with metoprolol. Patient ambulated on the morning of 07/18/16 without difficulty and complains of moderate discomfort to incision site. The patient was discharged.    Discharge Exam: Blood pressure (!) 153/85, pulse 60, temperature 98.2 F (36.8 C), temperature source Oral, resp. rate 18, height 5\' 11"  (1.803 m), weight 110.3 kg (243 lb 3.2 oz), SpO2 97 %.   General appearance: alert Head: Normocephalic, without obvious abnormality, atraumatic Eyes: conjunctivae/corneas clear. PERRL, EOM's intact. Fundi benign. Ears: normal TM's and external ear canals both ears Nose: Nares normal. Septum midline. Mucosa normal. No drainage or sinus tenderness. Throat: lips, mucosa, and tongue normal; teeth and gums normal Neck: no adenopathy, no carotid bruit, no JVD, supple, symmetrical, trachea midline and thyroid not enlarged, symmetric, no tenderness/mass/nodules Back: symmetric, no curvature. ROM normal. No CVA tenderness. Resp: clear to auscultation bilaterally Chest wall: no tenderness Cardio: regular rate and rhythm, S1, S2 normal, no murmur, click, rub or gallop GI: soft, non-tender; bowel sounds normal; no masses,  no organomegaly Extremities: extremities normal, atraumatic, no cyanosis or edema Pulses: 2+ and symmetric Skin: Skin color, texture, turgor normal. No rashes or lesions Neurologic: Grossly  normal Labs:   Lab Results  Component Value Date   WBC 7.0 07/10/2016   HGB 13.4 07/10/2016   HCT 39.4 (L) 07/10/2016   MCV 87.9 07/10/2016   PLT 181 07/10/2016   No results for input(s): NA, K, CL, CO2, BUN, CREATININE, CALCIUM, PROT, BILITOT, ALKPHOS, ALT, AST, GLUCOSE in the last 168 hours.  Invalid input(s): LABALBU    Radiology: No evidence of post placement pneumothorax. EKG: Paced normal sinus rhythm at a rate of 61 bpm  FOLLOW UP PLANS AND APPOINTMENTS    Medication List    TAKE these medications   acetaminophen 325 MG tablet Commonly known as:  TYLENOL Take 650 mg by mouth every 4 (four) hours as needed for mild pain.   amLODipine 5 MG tablet Commonly known as:  NORVASC Take 1 tablet (5 mg total) by mouth daily.   amoxicillin 875 MG tablet Commonly known as:  AMOXIL Take 1 tablet by mouth 2 (two) times daily.   apixaban 5 MG Tabs tablet Commonly known as:  ELIQUIS Take 5 mg by mouth 2 (two) times daily.   aspirin EC 81 MG tablet Take 81 mg by mouth daily.   azelastine 0.1 % nasal spray Commonly known as:  ASTELIN Place 2 sprays into both nostrils 2 (two) times daily. Use in each nostril as directed PRN   cephALEXin 250 MG capsule Commonly known as:  KEFLEX Take 1 capsule (250 mg total) by mouth 4 (four) times daily.   cyanocobalamin 1000 MCG tablet Take 100 mcg by mouth daily.   cyclobenzaprine 5 MG tablet Commonly known as:  FLEXERIL Take 1 tablet (5 mg total) by mouth every 8 (eight) hours as needed for muscle spasms.   EPINEPHrine 0.3 mg/0.3 mL Soaj injection Commonly known as:  EPI-PEN Inject 0.3 mg into the muscle once. As needed   flecainide 100 MG tablet Commonly known  as:  TAMBOCOR Take 100 mg by mouth 2 (two) times daily.   fluticasone 50 MCG/ACT nasal spray Commonly known as:  FLONASE Place 2 sprays into both nostrils daily. PRN   losartan 100 MG tablet Commonly known as:  COZAAR Take 100 mg by mouth daily. At bedtime    metoprolol succinate 25 MG 24 hr tablet Commonly known as:  TOPROL-XL Take 25 mg by mouth daily. At bedtime   omega-3 acid ethyl esters 1 g capsule Commonly known as:  LOVAZA Take by mouth 2 (two) times daily.   omeprazole 20 MG capsule Commonly known as:  PRILOSEC Take 20 mg by mouth 2 (two) times daily before a meal.   ondansetron 4 MG disintegrating tablet Commonly known as:  ZOFRAN ODT Take 1 tablet (4 mg total) by mouth every 8 (eight) hours as needed for nausea or vomiting.   oxyCODONE 5 MG immediate release tablet Commonly known as:  ROXICODONE Take 1 tablet (5 mg total) by mouth every 6 (six) hours as needed for breakthrough pain.   traMADol 50 MG tablet Commonly known as:  ULTRAM Take by mouth 2 (two) times daily.   traMADol 50 MG tablet Commonly known as:  ULTRAM Take 1 tablet (50 mg total) by mouth 2 (two) times daily.        BRING ALL MEDICATIONS WITH YOU TO FOLLOW UP APPOINTMENTS  Time spent with patient to include physician time: 25 minutes Signed:  Clabe Seal PA-C 07/18/2016, 8:21 AM

## 2016-09-16 ENCOUNTER — Encounter: Payer: Self-pay | Admitting: *Deleted

## 2016-09-16 NOTE — Pre-Procedure Instructions (Signed)
CLEARED BY DR Ubaldo Glassing 09/05/16

## 2016-09-19 ENCOUNTER — Other Ambulatory Visit: Payer: Self-pay | Admitting: Surgery

## 2016-09-19 DIAGNOSIS — R102 Pelvic and perineal pain: Secondary | ICD-10-CM

## 2016-09-23 ENCOUNTER — Ambulatory Visit
Admission: RE | Admit: 2016-09-23 | Discharge: 2016-09-23 | Disposition: A | Discharge status: Home / Self Care | Payer: Medicare Other | Source: Ambulatory Visit | Attending: Surgery | Admitting: Surgery

## 2016-09-23 DIAGNOSIS — N429 Disorder of prostate, unspecified: Secondary | ICD-10-CM | POA: Diagnosis not present

## 2016-09-23 DIAGNOSIS — R1030 Lower abdominal pain, unspecified: Secondary | ICD-10-CM | POA: Diagnosis not present

## 2016-09-23 DIAGNOSIS — I700 Atherosclerosis of aorta: Secondary | ICD-10-CM | POA: Insufficient documentation

## 2016-09-23 DIAGNOSIS — K7689 Other specified diseases of liver: Secondary | ICD-10-CM | POA: Insufficient documentation

## 2016-09-23 DIAGNOSIS — N281 Cyst of kidney, acquired: Secondary | ICD-10-CM | POA: Insufficient documentation

## 2016-09-23 DIAGNOSIS — K5730 Diverticulosis of large intestine without perforation or abscess without bleeding: Secondary | ICD-10-CM | POA: Insufficient documentation

## 2016-09-23 DIAGNOSIS — R102 Pelvic and perineal pain: Secondary | ICD-10-CM | POA: Insufficient documentation

## 2016-09-23 DIAGNOSIS — Z9889 Other specified postprocedural states: Secondary | ICD-10-CM | POA: Diagnosis not present

## 2016-09-23 MED ORDER — IOPAMIDOL (ISOVUE-300) INJECTION 61%
100.0000 mL | Freq: Once | INTRAVENOUS | Status: AC | PRN
  Administered 2016-09-23: 100 mL via INTRAVENOUS

## 2016-09-24 NOTE — H&P (Signed)
See scanned note.

## 2016-09-25 ENCOUNTER — Ambulatory Visit: Payer: Medicare Other | Admitting: *Deleted

## 2016-09-25 ENCOUNTER — Encounter: Payer: Self-pay | Admitting: *Deleted

## 2016-09-25 ENCOUNTER — Ambulatory Visit
Admission: RE | Admit: 2016-09-25 | Discharge: 2016-09-25 | Disposition: A | Discharge status: Home / Self Care | Payer: Medicare Other | Source: Ambulatory Visit | Attending: Ophthalmology | Admitting: Ophthalmology

## 2016-09-25 ENCOUNTER — Encounter
Admission: RE | Disposition: A | Discharge status: Home / Self Care | Payer: Self-pay | Source: Ambulatory Visit | Attending: Ophthalmology

## 2016-09-25 DIAGNOSIS — K219 Gastro-esophageal reflux disease without esophagitis: Secondary | ICD-10-CM | POA: Diagnosis not present

## 2016-09-25 DIAGNOSIS — H2511 Age-related nuclear cataract, right eye: Secondary | ICD-10-CM | POA: Insufficient documentation

## 2016-09-25 DIAGNOSIS — J449 Chronic obstructive pulmonary disease, unspecified: Secondary | ICD-10-CM | POA: Insufficient documentation

## 2016-09-25 DIAGNOSIS — I10 Essential (primary) hypertension: Secondary | ICD-10-CM | POA: Diagnosis not present

## 2016-09-25 DIAGNOSIS — Z87891 Personal history of nicotine dependence: Secondary | ICD-10-CM | POA: Diagnosis not present

## 2016-09-25 HISTORY — DX: Presence of cardiac pacemaker: Z95.0

## 2016-09-25 HISTORY — PX: CATARACT EXTRACTION W/PHACO: SHX586

## 2016-09-25 SURGERY — PHACOEMULSIFICATION, CATARACT, WITH IOL INSERTION
Anesthesia: General | Site: Eye | Laterality: Right | Wound class: Clean

## 2016-09-25 MED ORDER — CYCLOPENTOLATE HCL 2 % OP SOLN
OPHTHALMIC | Status: AC
  Administered 2016-09-25: 1 [drp] via OPHTHALMIC
  Filled 2016-09-25: qty 2

## 2016-09-25 MED ORDER — PHENYLEPHRINE HCL 10 % OP SOLN
1.0000 [drp] | OPHTHALMIC | Status: AC | PRN
  Administered 2016-09-25 (×4): 1 [drp] via OPHTHALMIC

## 2016-09-25 MED ORDER — ARMC OPHTHALMIC DILATING DROPS: 1.0000 "application " | OPHTHALMIC | Status: DC

## 2016-09-25 MED ORDER — MOXIFLOXACIN HCL 0.5 % OP SOLN: 1.0000 [drp] | OPHTHALMIC | Status: DC | PRN

## 2016-09-25 MED ORDER — TETRACAINE HCL 0.5 % OP SOLN
OPHTHALMIC | Status: DC | PRN
  Administered 2016-09-25: 1 [drp] via OPHTHALMIC

## 2016-09-25 MED ORDER — CEFUROXIME OPHTHALMIC INJECTION 1 MG/0.1 ML
INJECTION | OPHTHALMIC | Status: AC
  Filled 2016-09-25: qty 0.1

## 2016-09-25 MED ORDER — BUPIVACAINE HCL (PF) 0.75 % IJ SOLN
INTRAMUSCULAR | Status: AC
  Filled 2016-09-25: qty 10

## 2016-09-25 MED ORDER — CARBACHOL 0.01 % IO SOLN
INTRAOCULAR | Status: DC | PRN
  Administered 2016-09-25: .5 mL via INTRAOCULAR

## 2016-09-25 MED ORDER — POVIDONE-IODINE 5 % OP SOLN
OPHTHALMIC | Status: DC | PRN
  Administered 2016-09-25: 1 via OPHTHALMIC

## 2016-09-25 MED ORDER — CEFUROXIME OPHTHALMIC INJECTION 1 MG/0.1 ML
INJECTION | OPHTHALMIC | Status: DC | PRN
  Administered 2016-09-25: .1 mL via INTRACAMERAL

## 2016-09-25 MED ORDER — MIDAZOLAM HCL 2 MG/2ML IJ SOLN
INTRAMUSCULAR | Status: DC | PRN
  Administered 2016-09-25: 0.5 mg via INTRAVENOUS
  Administered 2016-09-25: .25 mg via INTRAVENOUS
  Administered 2016-09-25: 1 mg via INTRAVENOUS

## 2016-09-25 MED ORDER — TETRACAINE HCL 0.5 % OP SOLN
OPHTHALMIC | Status: AC
  Filled 2016-09-25: qty 2

## 2016-09-25 MED ORDER — BUPIVACAINE HCL (PF) 0.75 % IJ SOLN
INTRAMUSCULAR | Status: DC | PRN
  Administered 2016-09-25: 4 mL via OPHTHALMIC

## 2016-09-25 MED ORDER — CYCLOPENTOLATE HCL 2 % OP SOLN
1.0000 [drp] | OPHTHALMIC | Status: AC | PRN
Start: 2016-09-25 — End: 2016-09-25
  Administered 2016-09-25 (×4): 1 [drp] via OPHTHALMIC

## 2016-09-25 MED ORDER — HYALURONIDASE HUMAN 150 UNIT/ML IJ SOLN
INTRAMUSCULAR | Status: AC
  Filled 2016-09-25: qty 1

## 2016-09-25 MED ORDER — EPINEPHRINE PF 1 MG/ML IJ SOLN
INTRAOCULAR | Status: DC | PRN
  Administered 2016-09-25: 1 mL via OPHTHALMIC

## 2016-09-25 MED ORDER — MOXIFLOXACIN HCL 0.5 % OP SOLN
OPHTHALMIC | Status: DC | PRN
  Administered 2016-09-25: 1 [drp] via OPHTHALMIC

## 2016-09-25 MED ORDER — MOXIFLOXACIN HCL 0.5 % OP SOLN
1.0000 [drp] | OPHTHALMIC | Status: AC
  Administered 2016-09-25 (×3): 1 [drp] via OPHTHALMIC

## 2016-09-25 MED ORDER — ALFENTANIL 500 MCG/ML IJ INJ
INJECTION | INTRAMUSCULAR | Status: DC | PRN
  Administered 2016-09-25 (×3): 62.5 ug via INTRAVENOUS
  Administered 2016-09-25 (×2): 250 ug via INTRAVENOUS

## 2016-09-25 MED ORDER — MOXIFLOXACIN HCL 0.5 % OP SOLN
OPHTHALMIC | Status: AC
  Administered 2016-09-25: 1 [drp] via OPHTHALMIC
  Filled 2016-09-25: qty 3

## 2016-09-25 MED ORDER — POVIDONE-IODINE 5 % OP SOLN
OPHTHALMIC | Status: AC
  Filled 2016-09-25: qty 30

## 2016-09-25 MED ORDER — SODIUM CHLORIDE 0.9 % IV SOLN
INTRAVENOUS | Status: DC
  Administered 2016-09-25 (×2): via INTRAVENOUS

## 2016-09-25 MED ORDER — NA CHONDROIT SULF-NA HYALURON 40-17 MG/ML IO SOLN
INTRAOCULAR | Status: DC | PRN
  Administered 2016-09-25: 1 mL via INTRAOCULAR

## 2016-09-25 MED ORDER — NA CHONDROIT SULF-NA HYALURON 40-17 MG/ML IO SOLN
INTRAOCULAR | Status: AC
  Filled 2016-09-25: qty 1

## 2016-09-25 MED ORDER — EPINEPHRINE PF 1 MG/ML IJ SOLN
INTRAMUSCULAR | Status: AC
  Filled 2016-09-25: qty 2

## 2016-09-25 MED ORDER — MIDAZOLAM HCL 2 MG/2ML IJ SOLN
INTRAMUSCULAR | Status: AC
  Filled 2016-09-25: qty 2

## 2016-09-25 MED ORDER — BSS IO SOLN
INTRAOCULAR | Status: DC | PRN
  Administered 2016-09-25: 2.25 mL via OPHTHALMIC

## 2016-09-25 MED ORDER — PHENYLEPHRINE HCL 10 % OP SOLN
OPHTHALMIC | Status: AC
  Administered 2016-09-25: 1 [drp] via OPHTHALMIC
  Filled 2016-09-25: qty 5

## 2016-09-25 SURGICAL SUPPLY — 29 items

## 2016-09-25 NOTE — Anesthesia Postprocedure Evaluation (Signed)
Anesthesia Post Note  Patient: Joseph Houston  Procedure(s) Performed: Procedure(s) (LRB): CATARACT EXTRACTION PHACO AND INTRAOCULAR LENS PLACEMENT (IOC) (Right)  Patient location during evaluation: PACU Anesthesia Type: MAC Level of consciousness: awake Pain management: pain level controlled Vital Signs Assessment: post-procedure vital signs reviewed and stable Respiratory status: spontaneous breathing Cardiovascular status: stable Anesthetic complications: no     Last Vitals:  Vitals:   09/25/16 1000 09/25/16 1101  BP: (!) 184/91 (!) 159/84  Pulse: 71   Resp: 16 16  Temp:      Last Pain:  Vitals:   09/25/16 1000  TempSrc: Temporal                 VAN STAVEREN,Sharene Krikorian

## 2016-09-25 NOTE — Anesthesia Preprocedure Evaluation (Signed)
Anesthesia Evaluation  Patient identified by MRN, date of birth, ID band Patient awake    Reviewed: Allergy & Precautions, NPO status , Patient's Chart, lab work & pertinent test results  Airway Mallampati: II       Dental  (+) Teeth Intact   Pulmonary COPD, former smoker,     + decreased breath sounds      Cardiovascular hypertension, Pt. on medications + dysrhythmias + pacemaker  Rhythm:Regular     Neuro/Psych  Headaches,    GI/Hepatic Neg liver ROS, hiatal hernia, GERD  ,  Endo/Other  negative endocrine ROS  Renal/GU negative Renal ROS     Musculoskeletal   Abdominal   Peds  Hematology  (+) anemia ,   Anesthesia Other Findings   Reproductive/Obstetrics                             Anesthesia Physical Anesthesia Plan  ASA: III  Anesthesia Plan: General   Post-op Pain Management:    Induction: Intravenous  Airway Management Planned: Natural Airway and Nasal Cannula  Additional Equipment:   Intra-op Plan:   Post-operative Plan:   Informed Consent: I have reviewed the patients History and Physical, chart, labs and discussed the procedure including the risks, benefits and alternatives for the proposed anesthesia with the patient or authorized representative who has indicated his/her understanding and acceptance.     Plan Discussed with: CRNA  Anesthesia Plan Comments:         Anesthesia Quick Evaluation

## 2016-09-25 NOTE — Transfer of Care (Signed)
Immediate Anesthesia Transfer of Care Note  Patient: Joseph Houston  Procedure(s) Performed: Procedure(s) with comments: CATARACT EXTRACTION PHACO AND INTRAOCULAR LENS PLACEMENT (IOC) (Right) - [place] 01:58 AP% 18.1 CDE 43.36 Fluid pack # [id number]  Patient Location: PACU  Anesthesia Type:MAC  Level of Consciousness: awake, alert  and oriented  Airway & Oxygen Therapy: Patient Spontanous Breathing  Post-op Assessment: Report given to RN and Post -op Vital signs reviewed and stable  Post vital signs: Reviewed and stable  Last Vitals:  Vitals:   09/25/16 0824 09/25/16 1000  BP: (!) 160/80 (!) 184/91  Pulse: 73 71  Resp: 16 16  Temp: 36.6 C     Last Pain:  Vitals:   09/25/16 1000  TempSrc: Temporal         Complications: No apparent anesthesia complications

## 2016-09-25 NOTE — Op Note (Signed)
Date of Surgery: 09/25/2016 Date of Dictation: 09/25/2016 10:43 AM Pre-operative Diagnosis:  Nuclear Sclerotic Cataract right Eye Post-operative Diagnosis: same Procedure performed: Extra-capsular Cataract Extraction (ECCE) with placement of a posterior chamber intraocular lens (IOL) right Eye IOL:  Implant Name Type Inv. Item Serial No. Manufacturer Lot No. LRB No. Used  LENS IOL ACRYSOF IQ 19.5 - [id number] Intraocular Lens LENS IOL ACRYSOF IQ 19.5 [id number] ALCON   Right 1   Anesthesia: 2% Lidocaine and 4% Marcaine in a 50/50 mixture with 10 unites/ml of Hylenex given as a peribulbar Anesthesiologist: Anesthesiologist: Iver Nestle, MD CRNA: Jennette Bill Complications: none Estimated Blood Loss: less than 1 ml  Description of procedure:  The patient was given anesthesia and sedation via intravenous access. The patient was then prepped and draped in the usual fashion. A 25-gauge needle was bent for initiating the capsulorhexis. A 5-0 silk suture was placed through the conjunctiva superior and inferiorly to serve as bridle sutures. Hemostasis was obtained at the superior limbus using an eraser cautery. A partial thickness groove was made at the anterior surgical limbus with a 64 Beaver blade and this was dissected anteriorly with an Avaya. The anterior chamber was entered at 10 o'clock with a 1.0 mm paracentesis knife and through the lamellar dissection with a 2.6 mm Alcon keratome. Epi-Shugarcaine 0.5 CC [9 cc BSS Plus (Alcon), 3 cc 4% preservative-free lidocaine (Hospira) and 4 cc 1:1000 preservative-free, bisulfite-free epinephrine] was injected into the anterior chamber via the paracentesis tract. Epi-Shugarcaine 0.5 CC [9 cc BSS Plus (Alcon), 3 cc 4% preservative-free lidocaine (Hospira) and 4 cc 1:1000 preservative-free, bisulfite-free epinephrine] was injected into the anterior chamber via the paracentesis tract. DiscoVisc was injected to replace the aqueous  and a continuous tear curvilinear capsulorhexis was performed using a bent 25-gauge needle.  Balance salt on a syringe was used to perform hydro-dissection and phacoemulsification was carried out using a divide and conquer technique. Procedure(s) with comments: CATARACT EXTRACTION PHACO AND INTRAOCULAR LENS PLACEMENT (IOC) (Right) - [place] 01:58 AP% 18.1 CDE 43.36 Fluid pack # [id number]. Irrigation/aspiration was used to remove the residual cortex and the capsular bag was inflated with DiscoVisc. The intraocular lens was inserted into the capsular bag using a pre-loaded UltraSert Delivery System. Irrigation/aspiration was used to remove the residual DiscoVisc. The wound was inflated with balanced salt and checked for leaks. None were found. Miostat was injected via the paracentesis track and 0.1 ml of cefuroxime containing 1 mg of drug  was injected via the paracentesis track. The wound was checked for leaks again and none were found.   The bridal sutures were removed and two drops of Vigamox were placed on the eye. An eye shield was placed to protect the eye and the patient was discharged to the recovery area in good condition.   Odus Clasby MD

## 2016-09-25 NOTE — Discharge Instructions (Addendum)
Follow Dr. Janyra Barillas's postop eye instruction sheet as reviewed.  Eye Surgery Discharge Instructions  Expect mild scratchy sensation or mild soreness. DO NOT RUB YOUR EYE!  The day of surgery:  Minimal physical activity, but bed rest is not required  No reading, computer work, or close hand work  No bending, lifting, or straining.  May watch TV  For 24 hours:  No driving, legal decisions, or alcoholic beverages  Safety precautions  Eat anything you prefer: It is better to start with liquids, then soup then solid foods.  _____ Eye patch should be worn until postoperative exam tomorrow.  ____ Solar shield eyeglasses should be worn for comfort in the sunlight/patch while sleeping  Resume all regular medications including aspirin or Coumadin if these were discontinued prior to surgery. You may shower, bathe, shave, or wash your hair. Tylenol may be taken for mild discomfort.  Call your doctor if you experience significant pain, nausea, or vomiting, fever > 101 or other signs of infection. [phone] or [phone] Specific instructions:  Follow-up Information    Atlas Kuc, MD Follow up.   Specialty:  Ophthalmology Why:  Raquel Sarna 09-26-16 @ 10:10 AM Contact information: [address]   [place] [address] [phone]

## 2016-09-25 NOTE — Anesthesia Post-op Follow-up Note (Cosign Needed)
Anesthesia QCDR form completed.        

## 2016-09-25 NOTE — Interval H&P Note (Signed)
History and Physical Interval Note:  09/25/2016 9:54 AM  Joseph Houston  has presented today for surgery, with the diagnosis of CATARACT  The various methods of treatment have been discussed with the patient and family. After consideration of risks, benefits and other options for treatment, the patient has consented to  Procedure(s): CATARACT EXTRACTION PHACO AND INTRAOCULAR LENS PLACEMENT ([place]) (Right) as a surgical intervention .  The patient's history has been reviewed, patient examined, no change in status, stable for surgery.  I have reviewed the patient's chart and labs.  Questions were answered to the patient's satisfaction.     Jessica Checketts

## 2016-09-26 ENCOUNTER — Encounter: Payer: Self-pay | Admitting: Ophthalmology

## 2017-08-05 DIAGNOSIS — L409 Psoriasis, unspecified: Secondary | ICD-10-CM

## 2017-08-05 HISTORY — DX: Psoriasis, unspecified: L40.9

## 2017-08-14 ENCOUNTER — Other Ambulatory Visit: Payer: Self-pay | Admitting: Orthopedic Surgery

## 2017-08-19 ENCOUNTER — Other Ambulatory Visit: Payer: Self-pay | Admitting: Orthopedic Surgery

## 2017-08-19 DIAGNOSIS — M1712 Unilateral primary osteoarthritis, left knee: Secondary | ICD-10-CM

## 2017-09-04 ENCOUNTER — Ambulatory Visit
Admission: RE | Admit: 2017-09-04 | Discharge: 2017-09-04 | Disposition: A | Discharge status: Home / Self Care | Payer: Medicare Other | Source: Ambulatory Visit | Attending: Orthopedic Surgery | Admitting: Orthopedic Surgery

## 2017-09-04 DIAGNOSIS — M1712 Unilateral primary osteoarthritis, left knee: Secondary | ICD-10-CM | POA: Insufficient documentation

## 2017-09-17 ENCOUNTER — Other Ambulatory Visit: Payer: Self-pay

## 2017-09-17 ENCOUNTER — Encounter
Admission: RE | Admit: 2017-09-17 | Discharge: 2017-09-17 | Disposition: A | Discharge status: Home / Self Care | Payer: Medicare Other | Source: Ambulatory Visit | Attending: Orthopedic Surgery | Admitting: Orthopedic Surgery

## 2017-09-17 DIAGNOSIS — Z01812 Encounter for preprocedural laboratory examination: Secondary | ICD-10-CM | POA: Diagnosis not present

## 2017-09-17 DIAGNOSIS — Z950 Presence of cardiac pacemaker: Secondary | ICD-10-CM | POA: Insufficient documentation

## 2017-09-17 DIAGNOSIS — Z7901 Long term (current) use of anticoagulants: Secondary | ICD-10-CM | POA: Insufficient documentation

## 2017-09-17 HISTORY — DX: Anxiety disorder, unspecified: F41.9

## 2017-09-17 HISTORY — DX: Restless legs syndrome: G25.81

## 2017-09-17 LAB — URINALYSIS, COMPLETE (UACMP) WITH MICROSCOPIC
Bacteria, UA: NONE SEEN
Bilirubin Urine: NEGATIVE
GLUCOSE, UA: NEGATIVE mg/dL
Ketones, ur: NEGATIVE mg/dL
Leukocytes, UA: NEGATIVE
Nitrite: NEGATIVE
PROTEIN: NEGATIVE mg/dL
Specific Gravity, Urine: 1.015 (ref 1.005–1.030)
Squamous Epithelial / LPF: NONE SEEN
pH: 5 (ref 5.0–8.0)

## 2017-09-17 LAB — BASIC METABOLIC PANEL
Anion gap: 10 (ref 5–15)
BUN: 25 mg/dL — ABNORMAL HIGH (ref 6–20)
CO2: 26 mmol/L (ref 22–32)
CREATININE: 1.44 mg/dL — AB (ref 0.61–1.24)
Calcium: 9.4 mg/dL (ref 8.9–10.3)
Chloride: 102 mmol/L (ref 101–111)
GFR, EST AFRICAN AMERICAN: 52 mL/min — AB (ref >60)
GFR, EST NON AFRICAN AMERICAN: 45 mL/min — AB (ref >60)
Glucose, Bld: 142 mg/dL — ABNORMAL HIGH (ref 65–99)
Potassium: 4.2 mmol/L (ref 3.5–5.1)
SODIUM: 138 mmol/L (ref 135–145)

## 2017-09-17 LAB — CBC
HCT: 45.9 % (ref 40.0–52.0)
HEMOGLOBIN: 15.1 g/dL (ref 13.0–18.0)
MCH: 29.8 pg (ref 26.0–34.0)
MCHC: 32.9 g/dL (ref 32.0–36.0)
MCV: 90.3 fL (ref 80.0–100.0)
Platelets: 187 10*3/uL (ref 150–440)
RBC: 5.08 MIL/uL (ref 4.40–5.90)
RDW: 14 % (ref 11.5–14.5)
WBC: 6.5 10*3/uL (ref 3.8–10.6)

## 2017-09-17 LAB — TYPE AND SCREEN
ABO/RH(D): O POS
ANTIBODY SCREEN: NEGATIVE

## 2017-09-17 LAB — SURGICAL PCR SCREEN
MRSA, PCR: NEGATIVE
Staphylococcus aureus: POSITIVE — AB

## 2017-09-17 LAB — SEDIMENTATION RATE: SED RATE: 6 mm/h (ref 0–20)

## 2017-09-17 LAB — APTT: APTT: 36 s (ref 24–36)

## 2017-09-17 LAB — PROTIME-INR
INR: 1.18
Prothrombin Time: 14.9 seconds (ref 11.4–15.2)

## 2017-09-17 NOTE — Patient Instructions (Signed)
Your procedure is scheduled on: 09/30/17 Tues Report to Same Day Surgery 2nd floor medical mall [facility] Entrance-take elevator on left to 2nd floor.  Check in with surgery information desk.) To find out your arrival time please call [phone] between 1PM - 3PM on 09/29/17 Mon  Remember: Instructions that are not followed completely may result in serious medical risk, up to and including death, or upon the discretion of your surgeon and anesthesiologist your surgery may need to be rescheduled.    _x___ 1. Do not eat food after midnight the night before your procedure. You may drink clear liquids up to 2 hours before you are scheduled to arrive at the hospital for your procedure.  Do not drink clear liquids within 2 hours of your scheduled arrival to the hospital.  Clear liquids include  --Water or Apple juice without pulp  --Clear carbohydrate beverage such as ClearFast or Gatorade  --Black Coffee or Clear Tea (No milk, no creamers, do not add anything to                  the coffee or Tea Type 1 and type 2 diabetics should only drink water.  No gum chewing or hard candies.     __x__ 2. No Alcohol for 24 hours before or after surgery.   __x__3. No Smoking or e-cigarettes for 24 prior to surgery.  Do not use any chewable tobacco products for at least 6 hour prior to surgery   ____  4. Bring all medications with you on the day of surgery if instructed.    __x__ 5. Notify your doctor if there is any change in your medical condition     (cold, fever, infections).    x___6. On the morning of surgery brush your teeth with toothpaste and water.  You may rinse your mouth with mouth wash if you wish.  Do not swallow any toothpaste or mouthwash.   Do not wear jewelry, make-up, hairpins, clips or nail polish.  Do not wear lotions, powders, or perfumes. You may wear deodorant.  Do not shave 48 hours prior to surgery. Men may shave face and neck.  Do not bring valuables to the hospital.     [facility] is not responsible for any belongings or valuables.               Contacts, dentures or bridgework may not be worn into surgery.  Leave your suitcase in the car. After surgery it may be brought to your room.  For patients admitted to the hospital, discharge time is determined by your                       treatment team.  _  Patients discharged the day of surgery will not be allowed to drive home.  You will need someone to drive you home and stay with you the night of your procedure.    Please read over the following fact sheets that you were given:   [facility] Preparing for Surgery and or MRSA Information   _x___ Take anti-hypertensive listed below, cardiac, seizure, asthma,     anti-reflux and psychiatric medicines. These include:  1. amLODipine (NORVASC) 5 MG tablet  2.flecainide (TAMBOCOR) 100 MG tablet  3.metoprolol tartrate (LOPRESSOR) 25 MG tablet  4.omeprazole (PRILOSEC) 20 MG capsule  5.  6.  ____Fleets enema or Magnesium Citrate as directed.   _x___ Use CHG Soap or sage wipes as directed on instruction sheet  ____ Use inhalers on the day of surgery and bring to hospital day of surgery  ____ Stop Metformin and Janumet 2 days prior to surgery.    ____ Take 1/2 of usual insulin dose the night before surgery and none on the morning     surgery.   _x___ Follow recommendations from Cardiologist, Pulmonologist or PCP regarding          stopping Aspirin, Coumadin, Plavix ,Eliquis, Effient, or Pradaxa, and Pletal. Stop aspirin 1 week before surgery and Eliquis 3 days before surgery if OK with Dr Ubaldo Glassing  n X____Stop Anti-inflammatories such as Advil, Aleve, Ibuprofen, Motrin, Naproxen, Naprosyn, Goodies powders or aspirin products. OK to take Tylenol and                          Celebrex.   _x___ Stop supplements until after surgery.  But may continue Vitamin D, Vitamin B,       and multivitamin.   ____ Bring C-Pap to the hospital.

## 2017-09-18 LAB — URINE CULTURE: CULTURE: NO GROWTH

## 2017-09-18 NOTE — Pre-Procedure Instructions (Signed)
Patient notified by Dr Bethanne Ginger office to stop Eliquis 3 days before surgery and to stop aspirin 5 days before surgery.

## 2017-09-23 NOTE — Pre-Procedure Instructions (Signed)
POS STAPH FAXED TO DR [facility]

## 2017-09-30 ENCOUNTER — Inpatient Hospital Stay: Payer: Medicare Other | Admitting: Anesthesiology

## 2017-09-30 ENCOUNTER — Inpatient Hospital Stay: Payer: Medicare Other

## 2017-09-30 ENCOUNTER — Other Ambulatory Visit: Payer: Self-pay

## 2017-09-30 ENCOUNTER — Inpatient Hospital Stay
Admission: RE | Admit: 2017-09-30 | Discharge: 2017-10-02 | DRG: 470 | Disposition: A | Discharge status: Skilled Nursing Facility | Payer: Medicare Other | Source: Ambulatory Visit | Attending: Orthopedic Surgery | Admitting: Orthopedic Surgery

## 2017-09-30 ENCOUNTER — Encounter
Admission: RE | Disposition: A | Discharge status: Skilled Nursing Facility | Payer: Self-pay | Source: Ambulatory Visit | Attending: Orthopedic Surgery

## 2017-09-30 DIAGNOSIS — M1712 Unilateral primary osteoarthritis, left knee: Secondary | ICD-10-CM | POA: Diagnosis present

## 2017-09-30 DIAGNOSIS — K449 Diaphragmatic hernia without obstruction or gangrene: Secondary | ICD-10-CM | POA: Diagnosis present

## 2017-09-30 DIAGNOSIS — Z950 Presence of cardiac pacemaker: Secondary | ICD-10-CM | POA: Diagnosis not present

## 2017-09-30 DIAGNOSIS — J449 Chronic obstructive pulmonary disease, unspecified: Secondary | ICD-10-CM | POA: Diagnosis present

## 2017-09-30 DIAGNOSIS — I2510 Atherosclerotic heart disease of native coronary artery without angina pectoris: Secondary | ICD-10-CM | POA: Diagnosis present

## 2017-09-30 DIAGNOSIS — G8918 Other acute postprocedural pain: Secondary | ICD-10-CM

## 2017-09-30 DIAGNOSIS — N400 Enlarged prostate without lower urinary tract symptoms: Secondary | ICD-10-CM | POA: Diagnosis present

## 2017-09-30 DIAGNOSIS — R7303 Prediabetes: Secondary | ICD-10-CM | POA: Diagnosis present

## 2017-09-30 DIAGNOSIS — F419 Anxiety disorder, unspecified: Secondary | ICD-10-CM | POA: Diagnosis present

## 2017-09-30 DIAGNOSIS — K219 Gastro-esophageal reflux disease without esophagitis: Secondary | ICD-10-CM | POA: Diagnosis present

## 2017-09-30 DIAGNOSIS — G2581 Restless legs syndrome: Secondary | ICD-10-CM | POA: Diagnosis present

## 2017-09-30 DIAGNOSIS — Z87891 Personal history of nicotine dependence: Secondary | ICD-10-CM

## 2017-09-30 DIAGNOSIS — I10 Essential (primary) hypertension: Secondary | ICD-10-CM | POA: Diagnosis present

## 2017-09-30 HISTORY — PX: TOTAL KNEE ARTHROPLASTY: SHX125

## 2017-09-30 LAB — ABO/RH: ABO/RH(D): O POS

## 2017-09-30 SURGERY — ARTHROPLASTY, KNEE, TOTAL
Anesthesia: Spinal | Laterality: Left

## 2017-09-30 MED ORDER — PROPOFOL 500 MG/50ML IV EMUL
INTRAVENOUS | Status: DC | PRN
  Administered 2017-09-30: 75 ug/kg/min via INTRAVENOUS

## 2017-09-30 MED ORDER — BISACODYL 10 MG RE SUPP
10.0000 mg | Freq: Every day | RECTAL | Status: DC | PRN
  Administered 2017-10-02: 10 mg via RECTAL
  Filled 2017-09-30: qty 1

## 2017-09-30 MED ORDER — SODIUM CHLORIDE 0.9 % IV SOLN
INTRAVENOUS | Status: DC | PRN
  Administered 2017-09-30: 60 mL

## 2017-09-30 MED ORDER — ACETAMINOPHEN 500 MG PO TABS
1000.0000 mg | ORAL_TABLET | Freq: Four times a day (QID) | ORAL | Status: AC
  Administered 2017-09-30 – 2017-10-01 (×4): 1000 mg via ORAL
  Filled 2017-09-30 (×4): qty 2

## 2017-09-30 MED ORDER — METOPROLOL TARTRATE 25 MG PO TABS
25.0000 mg | ORAL_TABLET | Freq: Two times a day (BID) | ORAL | Status: DC
  Administered 2017-09-30 – 2017-10-02 (×4): 25 mg via ORAL
  Filled 2017-09-30 (×5): qty 1

## 2017-09-30 MED ORDER — EPINEPHRINE PF 1 MG/ML IJ SOLN
INTRAMUSCULAR | Status: AC
  Filled 2017-09-30: qty 1

## 2017-09-30 MED ORDER — DOCUSATE SODIUM 100 MG PO CAPS
100.0000 mg | ORAL_CAPSULE | Freq: Two times a day (BID) | ORAL | Status: DC
  Administered 2017-09-30 – 2017-10-02 (×4): 100 mg via ORAL
  Filled 2017-09-30 (×4): qty 1

## 2017-09-30 MED ORDER — SODIUM CHLORIDE 0.9 % IJ SOLN
INTRAMUSCULAR | Status: DC | PRN
  Administered 2017-09-30: 40 mL via INTRAVENOUS

## 2017-09-30 MED ORDER — OXYCODONE HCL 5 MG PO TABS
10.0000 mg | ORAL_TABLET | ORAL | Status: DC | PRN
  Administered 2017-09-30 – 2017-10-02 (×5): 10 mg via ORAL
  Filled 2017-09-30 (×5): qty 2

## 2017-09-30 MED ORDER — ZOLPIDEM TARTRATE 5 MG PO TABS: 5.0000 mg | ORAL_TABLET | Freq: Every evening | ORAL | Status: DC | PRN

## 2017-09-30 MED ORDER — PANTOPRAZOLE SODIUM 40 MG PO TBEC
40.0000 mg | DELAYED_RELEASE_TABLET | Freq: Every day | ORAL | Status: DC
  Administered 2017-10-01 – 2017-10-02 (×2): 40 mg via ORAL
  Filled 2017-09-30 (×2): qty 1

## 2017-09-30 MED ORDER — ACETAMINOPHEN 325 MG PO TABS: 650.0000 mg | ORAL_TABLET | ORAL | Status: DC | PRN

## 2017-09-30 MED ORDER — AMLODIPINE BESYLATE 5 MG PO TABS
5.0000 mg | ORAL_TABLET | Freq: Every day | ORAL | Status: DC
  Administered 2017-10-01 – 2017-10-02 (×2): 5 mg via ORAL
  Filled 2017-09-30 (×2): qty 1

## 2017-09-30 MED ORDER — LOSARTAN POTASSIUM 50 MG PO TABS
100.0000 mg | ORAL_TABLET | Freq: Every day | ORAL | Status: DC
  Administered 2017-09-30 – 2017-10-01 (×2): 100 mg via ORAL
  Filled 2017-09-30 (×2): qty 2

## 2017-09-30 MED ORDER — FENTANYL CITRATE (PF) 100 MCG/2ML IJ SOLN: 25.0000 ug | INTRAMUSCULAR | Status: DC | PRN

## 2017-09-30 MED ORDER — PHENOL 1.4 % MT LIQD: 1.0000 | OROMUCOSAL | Status: DC | PRN

## 2017-09-30 MED ORDER — PROPOFOL 500 MG/50ML IV EMUL
INTRAVENOUS | Status: AC
  Filled 2017-09-30: qty 50

## 2017-09-30 MED ORDER — BUPIVACAINE HCL (PF) 0.5 % IJ SOLN
INTRAMUSCULAR | Status: DC | PRN
  Administered 2017-09-30: 3 mL

## 2017-09-30 MED ORDER — LACTATED RINGERS IV SOLN
INTRAVENOUS | Status: DC
  Administered 2017-09-30 (×2): via INTRAVENOUS

## 2017-09-30 MED ORDER — ONDANSETRON HCL 4 MG/2ML IJ SOLN
4.0000 mg | Freq: Once | INTRAMUSCULAR | Status: DC | PRN
Start: 2017-09-30 — End: 2017-09-30

## 2017-09-30 MED ORDER — MORPHINE SULFATE (PF) 10 MG/ML IV SOLN
INTRAVENOUS | Status: AC
  Filled 2017-09-30: qty 1

## 2017-09-30 MED ORDER — APIXABAN 5 MG PO TABS
5.0000 mg | ORAL_TABLET | Freq: Two times a day (BID) | ORAL | Status: DC
  Administered 2017-09-30 – 2017-10-02 (×4): 5 mg via ORAL
  Filled 2017-09-30 (×4): qty 1

## 2017-09-30 MED ORDER — ONDANSETRON HCL 4 MG PO TABS: 4.0000 mg | ORAL_TABLET | Freq: Four times a day (QID) | ORAL | Status: DC | PRN

## 2017-09-30 MED ORDER — PROPOFOL 10 MG/ML IV BOLUS
INTRAVENOUS | Status: DC | PRN
  Administered 2017-09-30: 50 mg via INTRAVENOUS

## 2017-09-30 MED ORDER — ALUM & MAG HYDROXIDE-SIMETH 200-200-20 MG/5ML PO SUSP
30.0000 mL | ORAL | Status: DC | PRN
  Administered 2017-09-30: 30 mL via ORAL
  Filled 2017-09-30: qty 30

## 2017-09-30 MED ORDER — HYDROMORPHONE HCL 1 MG/ML IJ SOLN
0.5000 mg | INTRAMUSCULAR | Status: DC | PRN
  Administered 2017-09-30 (×2): 0.5 mg via INTRAVENOUS
  Filled 2017-09-30 (×2): qty 1

## 2017-09-30 MED ORDER — METHOCARBAMOL 500 MG PO TABS
500.0000 mg | ORAL_TABLET | Freq: Four times a day (QID) | ORAL | Status: DC | PRN
Start: 2017-09-30 — End: 2017-10-02
  Administered 2017-09-30: 500 mg via ORAL
  Filled 2017-09-30: qty 1

## 2017-09-30 MED ORDER — ACETAMINOPHEN 650 MG RE SUPP: 650.0000 mg | RECTAL | Status: DC | PRN

## 2017-09-30 MED ORDER — CEFAZOLIN SODIUM-DEXTROSE 2-4 GM/100ML-% IV SOLN
2.0000 g | Freq: Four times a day (QID) | INTRAVENOUS | Status: AC
  Administered 2017-09-30 – 2017-10-01 (×3): 2 g via INTRAVENOUS
  Filled 2017-09-30 (×3): qty 100

## 2017-09-30 MED ORDER — CEFAZOLIN SODIUM-DEXTROSE 2-4 GM/100ML-% IV SOLN
2.0000 g | Freq: Once | INTRAVENOUS | Status: AC
  Administered 2017-09-30: 2 g via INTRAVENOUS

## 2017-09-30 MED ORDER — MORPHINE SULFATE 10 MG/ML IJ SOLN
INTRAMUSCULAR | Status: DC | PRN
  Administered 2017-09-30: 10 mg via INTRAVENOUS

## 2017-09-30 MED ORDER — DIPHENHYDRAMINE HCL 12.5 MG/5ML PO ELIX
12.5000 mg | ORAL_SOLUTION | ORAL | Status: DC | PRN
  Administered 2017-09-30: 25 mg via ORAL
  Filled 2017-09-30: qty 10

## 2017-09-30 MED ORDER — METHOCARBAMOL 1000 MG/10ML IJ SOLN
500.0000 mg | Freq: Four times a day (QID) | INTRAMUSCULAR | Status: DC | PRN
  Filled 2017-09-30: qty 5

## 2017-09-30 MED ORDER — MAGNESIUM HYDROXIDE 400 MG/5ML PO SUSP
30.0000 mL | Freq: Every day | ORAL | Status: DC | PRN
  Administered 2017-10-01: 30 mL via ORAL
  Filled 2017-09-30: qty 30

## 2017-09-30 MED ORDER — METOCLOPRAMIDE HCL 10 MG PO TABS
5.0000 mg | ORAL_TABLET | Freq: Three times a day (TID) | ORAL | Status: DC | PRN
  Administered 2017-09-30: 10 mg via ORAL
  Filled 2017-09-30: qty 1

## 2017-09-30 MED ORDER — SODIUM CHLORIDE 0.9 % IJ SOLN
INTRAMUSCULAR | Status: AC
  Filled 2017-09-30: qty 100

## 2017-09-30 MED ORDER — METOCLOPRAMIDE HCL 5 MG/ML IJ SOLN: 5.0000 mg | Freq: Three times a day (TID) | INTRAMUSCULAR | Status: DC | PRN

## 2017-09-30 MED ORDER — NEOMYCIN-POLYMYXIN B GU 40-200000 IR SOLN
Status: AC
  Filled 2017-09-30: qty 20

## 2017-09-30 MED ORDER — CEFAZOLIN SODIUM-DEXTROSE 2-4 GM/100ML-% IV SOLN
INTRAVENOUS | Status: AC
  Filled 2017-09-30: qty 100

## 2017-09-30 MED ORDER — EPINEPHRINE 0.3 MG/0.3ML IJ SOAJ: 0.3000 mg | Freq: Once | INTRAMUSCULAR | Status: DC

## 2017-09-30 MED ORDER — BUPIVACAINE HCL (PF) 0.25 % IJ SOLN
INTRAMUSCULAR | Status: AC
  Filled 2017-09-30: qty 30

## 2017-09-30 MED ORDER — MENTHOL 3 MG MT LOZG: 1.0000 | LOZENGE | OROMUCOSAL | Status: DC | PRN

## 2017-09-30 MED ORDER — OMEGA-3-ACID ETHYL ESTERS 1 G PO CAPS
2.0000 g | ORAL_CAPSULE | Freq: Every day | ORAL | Status: DC
  Administered 2017-10-01 – 2017-10-02 (×2): 2 g via ORAL
  Filled 2017-09-30 (×2): qty 2

## 2017-09-30 MED ORDER — OXYCODONE HCL 5 MG PO TABS
5.0000 mg | ORAL_TABLET | ORAL | Status: DC | PRN
  Administered 2017-09-30 – 2017-10-01 (×6): 5 mg via ORAL
  Filled 2017-09-30 (×7): qty 1

## 2017-09-30 MED ORDER — FLECAINIDE ACETATE 100 MG PO TABS
100.0000 mg | ORAL_TABLET | Freq: Two times a day (BID) | ORAL | Status: DC
  Administered 2017-09-30 – 2017-10-02 (×4): 100 mg via ORAL
  Filled 2017-09-30 (×5): qty 1

## 2017-09-30 MED ORDER — BUPIVACAINE LIPOSOME 1.3 % IJ SUSP
INTRAMUSCULAR | Status: AC
  Filled 2017-09-30: qty 20

## 2017-09-30 MED ORDER — ONDANSETRON HCL 4 MG/2ML IJ SOLN
4.0000 mg | Freq: Four times a day (QID) | INTRAMUSCULAR | Status: DC | PRN
  Administered 2017-09-30 (×2): 4 mg via INTRAVENOUS
  Filled 2017-09-30 (×2): qty 2

## 2017-09-30 MED ORDER — SODIUM CHLORIDE 0.9 % IV SOLN
INTRAVENOUS | Status: DC | PRN
  Administered 2017-09-30: 35 ug/min via INTRAVENOUS

## 2017-09-30 MED ORDER — NEOMYCIN-POLYMYXIN B GU 40-200000 IR SOLN
Status: DC | PRN
  Administered 2017-09-30: 16 mL

## 2017-09-30 MED ORDER — BUPIVACAINE-EPINEPHRINE (PF) 0.25% -1:200000 IJ SOLN
INTRAMUSCULAR | Status: DC | PRN
  Administered 2017-09-30: 30 mL via PERINEURAL

## 2017-09-30 MED ORDER — MAGNESIUM CITRATE PO SOLN: 1.0000 | Freq: Once | ORAL | Status: DC | PRN

## 2017-09-30 MED ORDER — FLUTICASONE PROPIONATE 50 MCG/ACT NA SUSP: 2.0000 | Freq: Every day | NASAL | Status: DC | PRN

## 2017-09-30 MED ORDER — SODIUM CHLORIDE 0.9 % IV SOLN
INTRAVENOUS | Status: DC
  Administered 2017-09-30 – 2017-10-01 (×2): via INTRAVENOUS

## 2017-09-30 MED ORDER — ASPIRIN EC 81 MG PO TBEC
81.0000 mg | DELAYED_RELEASE_TABLET | Freq: Every day | ORAL | Status: DC
  Administered 2017-10-01 – 2017-10-02 (×2): 81 mg via ORAL
  Filled 2017-09-30 (×2): qty 1

## 2017-09-30 SURGICAL SUPPLY — 63 items
BANDAGE ACE 6X5 VEL STRL LF (GAUZE/BANDAGES/DRESSINGS) ×3 IMPLANT
BLADE SAW 1 (BLADE) ×3 IMPLANT
BLOCK CUTTING FEMUR SZ6 LEFT (MISCELLANEOUS) IMPLANT
BLOCK CUTTING TIBIAL 5 LT (MISCELLANEOUS) IMPLANT
CANISTER SUCT 1200ML W/VALVE (MISCELLANEOUS) ×3 IMPLANT
CANISTER SUCT 3000ML PPV (MISCELLANEOUS) ×6 IMPLANT
CAPT KNEE TOTAL 3 ×3 IMPLANT
CEMENT HV SMART SET (Cement) ×9 IMPLANT
CHLORAPREP W/TINT 26ML (MISCELLANEOUS) ×6 IMPLANT
COOLER POLAR GLACIER W/PUMP (MISCELLANEOUS) ×3 IMPLANT
CUFF TOURN 24 STER (MISCELLANEOUS) IMPLANT
CUFF TOURN 30 STER DUAL PORT (MISCELLANEOUS) ×3 IMPLANT
DRAPE SHEET LG 3/4 BI-LAMINATE (DRAPES) ×6 IMPLANT
ELECT CAUTERY BLADE 6.4 (BLADE) ×3 IMPLANT
ELECT REM PT RETURN 9FT ADLT (ELECTROSURGICAL) ×3
ELECTRODE REM PT RTRN 9FT ADLT (ELECTROSURGICAL) ×1 IMPLANT
GAUZE PETRO XEROFOAM 1X8 (MISCELLANEOUS) ×3 IMPLANT
GAUZE SPONGE 4X4 12PLY STRL (GAUZE/BANDAGES/DRESSINGS) ×3 IMPLANT
GLOVE BIOGEL PI IND STRL 9 (GLOVE) ×1 IMPLANT
GLOVE BIOGEL PI INDICATOR 9 (GLOVE) ×2
GLOVE INDICATOR 8.0 STRL GRN (GLOVE) ×3 IMPLANT
GLOVE SURG ORTHO 8.0 STRL STRW (GLOVE) ×3 IMPLANT
GLOVE SURG SYN 9.0  PF PI (GLOVE) ×2
GLOVE SURG SYN 9.0 PF PI (GLOVE) ×1 IMPLANT
GOWN SRG 2XL LVL 4 RGLN SLV (GOWNS) ×1 IMPLANT
GOWN STRL NON-REIN 2XL LVL4 (GOWNS) ×2
GOWN STRL REUS W/ TWL LRG LVL3 (GOWN DISPOSABLE) ×1 IMPLANT
GOWN STRL REUS W/ TWL XL LVL3 (GOWN DISPOSABLE) ×1 IMPLANT
GOWN STRL REUS W/TWL LRG LVL3 (GOWN DISPOSABLE) ×2
GOWN STRL REUS W/TWL XL LVL3 (GOWN DISPOSABLE) ×2
HOLDER FOLEY CATH W/STRAP (MISCELLANEOUS) ×3 IMPLANT
HOOD PEEL AWAY FLYTE STAYCOOL (MISCELLANEOUS) ×6 IMPLANT
IMMBOLIZER KNEE 19 BLUE UNIV (SOFTGOODS) IMPLANT
KIT PREVENA INCISION MGT20CM45 (CANNISTER) ×3 IMPLANT
KIT TURNOVER KIT A (KITS) ×3 IMPLANT
KNEE MEDACTA TIBIAL/FEMORAL BL (Knees) ×3 IMPLANT
KNIFE SCULPS 14X20 (INSTRUMENTS) ×3 IMPLANT
NDL SAFETY ECLIPSE 18X1.5 (NEEDLE) ×1 IMPLANT
NEEDLE HYPO 18GX1.5 SHARP (NEEDLE) ×2
NEEDLE SPNL 18GX3.5 QUINCKE PK (NEEDLE) ×3 IMPLANT
NEEDLE SPNL 20GX3.5 QUINCKE YW (NEEDLE) ×3 IMPLANT
NS IRRIG 1000ML POUR BTL (IV SOLUTION) ×3 IMPLANT
PACK TOTAL KNEE (MISCELLANEOUS) ×3 IMPLANT
PAD WRAPON POLAR KNEE (MISCELLANEOUS) ×1 IMPLANT
PULSAVAC PLUS IRRIG FAN TIP (DISPOSABLE) ×3
SOL .9 NS 3000ML IRR  AL (IV SOLUTION) ×2
SOL .9 NS 3000ML IRR UROMATIC (IV SOLUTION) ×1 IMPLANT
STAPLER SKIN PROX 35W (STAPLE) ×3 IMPLANT
SUCTION FRAZIER HANDLE 10FR (MISCELLANEOUS) ×2
SUCTION TUBE FRAZIER 10FR DISP (MISCELLANEOUS) ×1 IMPLANT
SUT DVC 2 QUILL PDO  T11 36X36 (SUTURE) ×2
SUT DVC 2 QUILL PDO T11 36X36 (SUTURE) ×1 IMPLANT
SUT TICRON 2-0 30IN 311381 (SUTURE) ×3 IMPLANT
SUT V-LOC 90 ABS DVC 3-0 CL (SUTURE) ×3 IMPLANT
SYR 20CC LL (SYRINGE) ×3 IMPLANT
SYR 50ML LL SCALE MARK (SYRINGE) ×6 IMPLANT
TIBIAL BONE MODEL LEFT (MISCELLANEOUS) IMPLANT
TIP FAN IRRIG PULSAVAC PLUS (DISPOSABLE) ×1 IMPLANT
TOWEL OR 17X26 4PK STRL BLUE (TOWEL DISPOSABLE) ×3 IMPLANT
TOWER CARTRIDGE SMART MIX (DISPOSABLE) ×3 IMPLANT
TRAY FOLEY W/METER SILVER 16FR (SET/KITS/TRAYS/PACK) ×3 IMPLANT
WND VAC CANISTER 500ML (MISCELLANEOUS) ×3 IMPLANT
WRAPON POLAR PAD KNEE (MISCELLANEOUS) ×3

## 2017-09-30 NOTE — Op Note (Signed)
09/30/2017  11:52 AM  PATIENT:  Joseph Houston  82 y.o. male  PRE-OPERATIVE DIAGNOSIS:  PRIMARY LOCALIZED OSTEOARTHRITIS OF LEFT KNEE  POST-OPERATIVE DIAGNOSIS:  osteoarthritis left knee  PROCEDURE:  Procedure(s): TOTAL KNEE ARTHROPLASTY (Left)  SURGEON: Laurene Footman, MD  ASSISTANTS: Rachelle Hora PA-C  ANESTHESIA:   spinal  EBL:  Total I/O In: 1000 [I.V.:1000] Out: 275 [Urine:200; Blood:75]  BLOOD ADMINISTERED:none  DRAINS: none   LOCAL MEDICATIONS USED:  MARCAINE    and OTHER morphine and Exparel  SPECIMEN:  No Specimen  DISPOSITION OF SPECIMEN:  N/A  COUNTS:  YES  TOURNIQUET:   Total Tourniquet Time Documented: Thigh (Left) - 76 minutes Total: Thigh (Left) - 76 minutes   IMPLANTS: Medacta GMK sphere, 6 femur, 5 tibia with short stem and 10 mm insert, size 3 patella, all components cemented  DICTATION: .Dragon Dictation   patient brought the operating room and after adequate anesthesia was obtained theleftleg was prepped and draped in sterile fashion was turned by the upper thigh. After patient identification and timeout procedures were completed midline incision was made followed by medial parapatellar arthrotomy. Inspection revealed extensive degenerative changes throughout the knee particularly medialcompartment there is exposed bone over the entire condyle tibial and femoral bone loss. Is also moderate synovitis which was subsequently excised. Fat pad and PCL and anterior cruciate ligament were excised at this time and proximal tibia cutting block applied with proximal tibia cut carried out.. The 4-in-1 cutting block was applied anterior posterior and chamfer cuts made. The proximal tibia was prepared with removal of posterior horns of menisci. Placement of the5 tibial baseplate and proximal tibial preparation with drillingwithproximal preparation for short stem. With the trial baseplate in place a 54femur trial was placed and a 71mm insert gave excellent  stability. Distal femoral drill holes were made followed by the trochlear groove cut with thereaming for the trochlear groove. These trials were then removed and the patella cut using the patellar cutting guide and measured to a size2.injection of the above local was placed and the tourniquet then raised.Bony surfaces were thoroughly irrigated and dried,the tibial component was cemented into place first followed by the placement of the polyethylene component,set screw with torque screwdriver and femoral component with excess cement removed and the knee held in extension patellar button was then clamped into place after the cementhadset the patella did track well, tourniquet then let down and bleeding checked electrocautery. After thorough irrigation of the knee the arthrotomy was repaired heavy Quill suture to close the capsule.3-0 v-locsubcutaneously followed by skin staples and incisional wound VAC, followed by Ace wrapand Polar Care    PLAN OF CARE: Admit to inpatient   PATIENT DISPOSITION:  PACU - hemodynamically stable.

## 2017-09-30 NOTE — H&P (Signed)
Reviewed paper H+P, will be scanned into chart. No changes noted.  

## 2017-09-30 NOTE — Anesthesia Procedure Notes (Signed)
Spinal  Patient location during procedure: OR Start time: 09/30/2017 9:40 AM End time: 09/30/2017 9:45 AM Staffing Resident/CRNA: Nelda Marseille, CRNA Performed: resident/CRNA  Preanesthetic Checklist Completed: patient identified, site marked, surgical consent, pre-op evaluation, timeout performed, IV checked, risks and benefits discussed and monitors and equipment checked Spinal Block Patient position: sitting Prep: Betadine Patient monitoring: heart rate, continuous pulse ox, blood pressure and cardiac monitor Approach: midline Location: L4-5 Injection technique: single-shot Needle Needle type: Whitacre and Introducer  Needle gauge: 24 G Needle length: 9 cm Additional Notes Negative paresthesia. Negative blood return. Positive free-flowing CSF. Expiration date of kit checked and confirmed. Patient tolerated procedure well, without complications.

## 2017-09-30 NOTE — Anesthesia Preprocedure Evaluation (Addendum)
Anesthesia Evaluation  Patient identified by MRN, date of birth, ID band Patient awake    Reviewed: Allergy & Precautions, NPO status , Patient's Chart, lab work & pertinent test results, reviewed documented beta blocker date and time   History of Anesthesia Complications (+) DIFFICULT AIRWAY  Airway Mallampati: III  TM Distance: >3 FB     Dental  (+) Chipped   Pulmonary shortness of breath, COPD, former smoker,           Cardiovascular hypertension, Pt. on medications and Pt. on home beta blockers + CAD  + dysrhythmias + pacemaker      Neuro/Psych  Headaches, Anxiety    GI/Hepatic   Endo/Other    Renal/GU      Musculoskeletal  (+) Arthritis ,   Abdominal   Peds  Hematology  (+) anemia ,   Anesthesia Other Findings Obese. No cardiac symptoms. EKG review and ok, poor r waves, and T wave inversion only in one lead. Will proceed.  Reproductive/Obstetrics                            Anesthesia Physical Anesthesia Plan  ASA: III  Anesthesia Plan: Spinal   Post-op Pain Management:    Induction:   PONV Risk Score and Plan:   Airway Management Planned:   Additional Equipment:   Intra-op Plan:   Post-operative Plan:   Informed Consent: I have reviewed the patients History and Physical, chart, labs and discussed the procedure including the risks, benefits and alternatives for the proposed anesthesia with the patient or authorized representative who has indicated his/her understanding and acceptance.     Plan Discussed with: CRNA  Anesthesia Plan Comments:         Anesthesia Quick Evaluation

## 2017-09-30 NOTE — Progress Notes (Signed)
PT Cancellation Note  Patient Details Name: Joseph Houston MRN: [id number] DOB: February 26, 1936   Cancelled Treatment:    Reason Eval/Treat Not Completed: Pain limiting ability to participate Attempted to see pt POD0 for L TKA.  He reports he is in too much pain, "If I was going to say 1 to 10 it'd be a 12."  Pt states he is looking forward to working with PT tomorrow once his pain is better.  Kreg Shropshire, DPT 09/30/2017, 4:43 PM

## 2017-09-30 NOTE — Transfer of Care (Signed)
Immediate Anesthesia Transfer of Care Note  Patient: Joseph Houston  Procedure(s) Performed: TOTAL KNEE ARTHROPLASTY (Left )  Patient Location: PACU  Anesthesia Type:Spinal  Level of Consciousness: drowsy  Airway & Oxygen Therapy: Patient Spontanous Breathing and Patient connected to face mask oxygen  Post-op Assessment: Report given to RN and Post -op Vital signs reviewed and stable  Post vital signs: Reviewed and stable  Last Vitals:  Vitals:   09/30/17 0828 09/30/17 1151  BP: 140/79 111/70  Pulse: 72 62  Resp: 17 12  Temp: (!) 36 C 36.7 C  SpO2: 97% 96%    Last Pain: There were no vitals filed for this visit.       Complications: No apparent anesthesia complications

## 2017-09-30 NOTE — Anesthesia Post-op Follow-up Note (Signed)
Anesthesia QCDR form completed.        

## 2017-09-30 NOTE — NC FL2 (Signed)
[place] LEVEL OF CARE SCREENING TOOL     IDENTIFICATION  Patient Name: Joseph Houston Birthdate: 08/08/1935 Sex: male Admission Date (Current Location): 09/30/2017  [place] and [place] Number:  Engineering geologist and Address:  [facility], [address], [place], [place] [address]      Provider Number: [id number]  Attending Physician Name and Address:  Hessie Knows, MD  Relative Name and Phone Number:       Current Level of Care: Hospital Recommended Level of Care: [place] Prior Approval Number:    Date Approved/Denied:   PASRR Number: [id number])  Discharge Plan: SNF    Current Diagnoses: Patient Active Problem List   Diagnosis Date Noted  . Primary localized osteoarthritis of left knee 09/30/2017  . Sick sinus syndrome ([place]) 07/17/2016  . A-fib ([place]) 12/22/2014  . 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 Acid reflux 12/22/2014  . BP (high blood pressure) 12/22/2014  . Chronic anemia 11/08/2014    Orientation RESPIRATION BLADDER Height & Weight     Self, Time, Situation, Place  Normal Continent Weight:   Height:     BEHAVIORAL SYMPTOMS/MOOD NEUROLOGICAL BOWEL NUTRITION STATUS      Continent Diet(Clear Liquid to be advanced)  AMBULATORY STATUS COMMUNICATION OF NEEDS Skin   Extensive Assist Verbally Surgical wounds(Incision Left Knee)                       Personal Care Assistance Level of Assistance  Bathing, Feeding, Dressing Bathing Assistance: Limited assistance Feeding assistance: Independent Dressing Assistance: Limited assistance     Functional Limitations Info  Sight, Hearing, Speech Sight Info: Adequate Hearing Info: Adequate Speech Info: Adequate    SPECIAL CARE FACTORS FREQUENCY  PT (By licensed PT), OT (By licensed OT)     PT Frequency: (5) OT Frequency: (5)            Contractures      Additional Factors Info  Code Status, Allergies Code Status Info:  (Full Code) Allergies Info: (DOG EPITHELIUM, MOLD EXTRACT TRICHOPHYTON, TREE EXTRACT, CODEINE, SULFA ANTIBIOTICS )           Current Medications (09/30/2017):  This is the current hospital active medication list Current Facility-Administered Medications  Medication Dose Route Frequency Provider Last Rate Last Dose  . 0.9 %  sodium chloride infusion   Intravenous Continuous Hessie Knows, MD 75 mL/hr at 09/30/17 1336    . acetaminophen (TYLENOL) tablet 650 mg  650 mg Oral Q4H PRN Hessie Knows, MD       Or  . acetaminophen (TYLENOL) suppository 650 mg  650 mg Rectal Q4H PRN Hessie Knows, MD      . acetaminophen (TYLENOL) tablet 1,000 mg  1,000 mg Oral Q6H Hessie Knows, MD      . alum & mag hydroxide-simeth (MAALOX/MYLANTA) 200-200-20 MG/5ML suspension 30 mL  30 mL Oral Q4H PRN Hessie Knows, MD      . Derrill Memo ON 10/01/2017] amLODipine (NORVASC) tablet 5 mg  5 mg Oral Daily Hessie Knows, MD      . apixaban [facility]) tablet 5 mg  5 mg Oral BID Hessie Knows, MD      . aspirin EC tablet 81 mg  81 mg Oral Daily Hessie Knows, MD      . bisacodyl (DULCOLAX) suppository 10 mg  10 mg Rectal Daily PRN Hessie Knows, MD      . ceFAZolin (ANCEF) IVPB 2g/100 mL premix  2 g Intravenous  Q6H Hessie Knows, MD      . diphenhydrAMINE (BENADRYL) 12.5 MG/5ML elixir 12.5-25 mg  12.5-25 mg Oral Q4H PRN Hessie Knows, MD      . docusate sodium (COLACE) capsule 100 mg  100 mg Oral BID Hessie Knows, MD      . EPINEPHrine (EPI-PEN) injection 0.3 mg  0.3 mg Intramuscular Once Hessie Knows, MD      . flecainide [facility]) tablet 100 mg  100 mg Oral BID Hessie Knows, MD      . fluticasone [facility]) 50 MCG/ACT nasal spray 2 spray  2 spray Each Nare Daily PRN Hessie Knows, MD      . HYDROmorphone (DILAUDID) injection 0.5 mg  0.5 mg Intravenous Q2H PRN Hessie Knows, MD      . losartan (COZAAR) tablet 100 mg  100 mg Oral QHS Hessie Knows, MD      . magnesium citrate solution 1 Bottle  1 Bottle Oral Once PRN Hessie Knows, MD      . magnesium hydroxide (MILK OF MAGNESIA) suspension 30 mL  30 mL Oral Daily PRN Hessie Knows, MD      . menthol-cetylpyridinium (CEPACOL) lozenge 3 mg  1 lozenge Oral PRN Hessie Knows, MD       Or  . phenol (CHLORASEPTIC) mouth spray 1 spray  1 spray Mouth/Throat PRN Hessie Knows, MD      . methocarbamol (ROBAXIN) tablet 500 mg  500 mg Oral Q6H PRN Hessie Knows, MD       Or  . methocarbamol (ROBAXIN) 500 mg in dextrose 5 % 50 mL IVPB  500 mg Intravenous Q6H PRN Hessie Knows, MD      . metoCLOPramide (REGLAN) tablet 5-10 mg  5-10 mg Oral Q8H PRN Hessie Knows, MD       Or  . metoCLOPramide (REGLAN) injection 5-10 mg  5-10 mg Intravenous Q8H PRN Hessie Knows, MD      . metoprolol tartrate (LOPRESSOR) tablet 25 mg  25 mg Oral BID Hessie Knows, MD      . omega-3 acid ethyl esters (LOVAZA) capsule 2 g  2 g Oral Daily Hessie Knows, MD      . ondansetron [facility]) tablet 4 mg  4 mg Oral Q6H PRN Hessie Knows, MD       Or  . ondansetron [facility]) injection 4 mg  4 mg Intravenous Q6H PRN Hessie Knows, MD      . oxyCODONE (Oxy IR/ROXICODONE) immediate release tablet 10 mg  10 mg Oral Q3H PRN Hessie Knows, MD      . oxyCODONE (Oxy IR/ROXICODONE) immediate release tablet 5 mg  5 mg Oral Q3H PRN Hessie Knows, MD      . Derrill Memo ON 10/01/2017] pantoprazole (PROTONIX) EC tablet 40 mg  40 mg Oral Daily Hessie Knows, MD      . zolpidem (AMBIEN) tablet 5 mg  5 mg Oral QHS PRN Hessie Knows, MD         Discharge Medications: Please see discharge summary for a list of discharge medications.  Relevant Imaging Results:  Relevant Lab Results:   Additional Information (SSN: [id number])  Smith Mince, Student-Social Work

## 2017-10-01 ENCOUNTER — Encounter: Payer: Self-pay | Admitting: Orthopedic Surgery

## 2017-10-01 LAB — BASIC METABOLIC PANEL
Anion gap: 7 (ref 5–15)
BUN: 23 mg/dL — AB (ref 6–20)
CO2: 26 mmol/L (ref 22–32)
CREATININE: 1.33 mg/dL — AB (ref 0.61–1.24)
Calcium: 8.3 mg/dL — ABNORMAL LOW (ref 8.9–10.3)
Chloride: 102 mmol/L (ref 101–111)
GFR calc Af Amer: 57 mL/min — ABNORMAL LOW (ref >60)
GFR, EST NON AFRICAN AMERICAN: 49 mL/min — AB (ref >60)
GLUCOSE: 130 mg/dL — AB (ref 65–99)
POTASSIUM: 4.1 mmol/L (ref 3.5–5.1)
Sodium: 135 mmol/L (ref 135–145)

## 2017-10-01 LAB — CBC
HCT: 38.8 % — ABNORMAL LOW (ref 40.0–52.0)
Hemoglobin: 12.9 g/dL — ABNORMAL LOW (ref 13.0–18.0)
MCH: 30 pg (ref 26.0–34.0)
MCHC: 33.2 g/dL (ref 32.0–36.0)
MCV: 90.4 fL (ref 80.0–100.0)
PLATELETS: 152 10*3/uL (ref 150–440)
RBC: 4.29 MIL/uL — AB (ref 4.40–5.90)
RDW: 14.2 % (ref 11.5–14.5)
WBC: 7.7 10*3/uL (ref 3.8–10.6)

## 2017-10-01 NOTE — Progress Notes (Addendum)
   Subjective: 1 Day Post-Op Procedure(s) (LRB): TOTAL KNEE ARTHROPLASTY (Left) Patient reports pain as 10 on 0-10 scale.  Mostly due to blue foam. Patient is well, and has had no acute complaints or problems. Nausea last night resolved. Denies any CP, SOB, ABD pain. We will continue therapy today.  .  Objective: Vital signs in last 24 hours: Temp:  [96.8 F (36 C)-98.4 F (36.9 C)] 98.4 F (36.9 C) (02/27 0428) Pulse Rate:  [59-76] 64 (02/27 0431) Resp:  [10-18] 18 (02/26 2042) BP: (93-166)/(44-84) 108/57 (02/27 0431) SpO2:  [89 %-98 %] 95 % (02/27 0431) Weight:  [229 lb 15 oz (104.3 kg)] 229 lb 15 oz (104.3 kg) (02/26 1315)  Intake/Output from previous day: 02/26 0701 - 02/27 0700 In: 3018.8 [P.O.:480; I.V.:2338.8; IV Piggyback:200] Out: 2385 [Urine:2310; Blood:75] Intake/Output this shift: No intake/output data recorded.  Recent Labs    10/01/17 0522  HGB 12.9*   Recent Labs    10/01/17 0522  WBC 7.7  RBC 4.29*  HCT 38.8*  PLT 152   Recent Labs    10/01/17 0522  NA 135  K 4.1  CL 102  CO2 26  BUN 23*  CREATININE 1.33*  GLUCOSE 130*  CALCIUM 8.3*   No results for input(s): LABPT, INR in the last 72 hours.  EXAM General - Patient is Alert, Appropriate and Oriented Extremity - Neurovascular intact Sensation intact distally Intact pulses distally Dorsiflexion/Plantar flexion intact No cellulitis present Compartment soft Dressing - dressing C/D/I and wound vac intact less than 50 cc drainage Motor Function - intact, moving foot and toes well on exam.   Past Medical History:  Diagnosis Date  . Anemia    was treated in the past, not now  . Anxiety   . Arthritis    knees  . BPH (benign prostatic hypertrophy)   . COPD (chronic obstructive pulmonary disease) ([place])    has had most of his life. has not used an inhaler for years  . Coronary artery disease   . Difficult intubation    pt reports bad sore throat after surgery.  [name] Dyspnea   .  Dysrhythmia 2016  . GERD (gastroesophageal reflux disease)   . Headache   . History of hiatal hernia   . History of kidney stones   . Hypertension   . Pre-diabetes   . Presence of permanent cardiac pacemaker   . Restless leg   . Vertigo 1992    Assessment/Plan:   1 Day Post-Op Procedure(s) (LRB): TOTAL KNEE ARTHROPLASTY (Left) Active Problems:   Primary localized osteoarthritis of left knee  Estimated body mass index is 32.07 kg/m as calculated from the following:   Height as of this encounter: 5\' 11"  (1.803 m).   Weight as of this encounter: 229 lb 15 oz (104.3 kg). Advance diet Up with therapy  Needs BM Recheck labs in the am CM to assist with discharge  DVT Prophylaxis - Foot Pumps, TED hose and eliquis Weight-Bearing as tolerated to left leg   T. Rachelle Hora, PA-C [place] 10/01/2017, 7:56 AM

## 2017-10-01 NOTE — Clinical Social Work Note (Signed)
Clinical Social Work Assessment  Patient Details  Name: Joseph Houston MRN: [id number] Date of Birth: 1936-02-27  Date of referral:  10/01/17               Reason for consult:  Facility Placement                Permission sought to share information with:  Chartered certified accountant granted to share information::  Yes, Verbal Permission Granted  Name::      [place]::   [place]   Relationship::     Contact Information:     Housing/Transportation Living arrangements for the past 2 months:  [place] of Information:  Patient, Spouse Patient Interpreter Needed:  None Criminal Activity/Legal Involvement Pertinent to Current Situation/Hospitalization:  No - Comment as needed Significant Relationships:  Spouse Lives with:  Spouse Do you feel safe going back to the place where you live?  Yes Need for family participation in patient care:  Yes (Comment)  Care giving concerns:  Patient lives in [place], [place] with his wife Joseph Houston.    Social Worker assessment / plan:  Holiday representative (CSW) received SNF consult. PT is recommending SNF. CSW met with patient and his wife Joseph Houston was at bedside. CSW introduced self and explained role of CSW department. Patient was alert and oriented X4 and was laying in the bed. Patient is post op day 1 from a left knee replacement and while working with PT he became dizzy. Per patient him and his wife live in [place], [place]. [place] explained SNF process and that [facility] will have to approve it. Patient and wife verbalized their understanding and requested [facility]. FL2 complete and faxed out.   CSW presented bed offers to patient and his wife. They chose [facility]. Per [facility] admissions coordinator at [facility] she will start [facility] SNF authorization today and she received the H&P that [place] faxed.   Employment status:  Retired Nurse, adult PT Recommendations:   [place] / Referral to community resources:  [place]  Patient/Family's Response to care:  Patient and his wife chose [facility].   Patient/Family's Understanding of and Emotional Response to Diagnosis, Current Treatment, and Prognosis:  Patient and his wife were very pleasant and thanked CSW for assistance.   Emotional Assessment Appearance:  Appears stated age Attitude/Demeanor/Rapport:    Affect (typically observed):  Accepting, Adaptable, Pleasant Orientation:  Oriented to Self, Oriented to Place, Oriented to  Time, Oriented to Situation Alcohol / Substance use:  Not Applicable Psych involvement (Current and /or in the community):  No (Comment)  Discharge Needs  Concerns to be addressed:  Discharge Planning Concerns Readmission within the last 30 days:  No Current discharge risk:  Dependent with Mobility Barriers to Discharge:  Continued Medical Work up   [employer], Veronia Beets, LCSW 10/01/2017, 2:18 PM

## 2017-10-01 NOTE — Progress Notes (Signed)
Physical Therapy Treatment Patient Details Name: Joseph Houston MRN: [id number] DOB: 1936-07-29 Today's Date: 10/01/2017    History of Present Illness Pt is a 52 you M with a PMH that includes: HTN, CAD, dysrhythmia s/p pacemaker, COPD, and OA of the L knee.  Pt is now s/p elective L TKA.     PT Comments    Patient without reports of dizziness this PM; vitals stable and WFL throughout session (no orthostasis noted). Generally guarded due to pain, but able to progress to OOB to chair with RW, cga/min assist from therapist.  Fair/good L quad activation and control in both open- and closed-chain activities.  Will plan to progress gait distance as tolerated next session.    Follow Up Recommendations  SNF     Equipment Recommendations  Rolling walker with 5" wheels    Recommendations for Other Services       Precautions / Restrictions Precautions Precautions: Knee;Fall Restrictions Weight Bearing Restrictions: Yes LLE Weight Bearing: Weight bearing as tolerated    Mobility  Bed Mobility Overal bed mobility: Needs Assistance Bed Mobility: Supine to Sit     Supine to sit: Supervision     General bed mobility comments: able to negotiate L LE over edge of bed without difficulty  Transfers Overall transfer level: Needs assistance Equipment used: Rolling walker (2 wheeled) Transfers: Sit to/from Stand Sit to Stand: Min guard;Min assist         General transfer comment: guarded transition with decreased active use o fL LE  Ambulation/Gait Ambulation/Gait assistance: Min guard;Min assist Ambulation Distance (Feet): 5 Feet Assistive device: Rolling walker (2 wheeled)       General Gait Details: 3-point gait pattern with fair weight shift/stance time L LE; no overt buckling or LOB.  Completes without reports of dizziness this PM   Stairs            Wheelchair Mobility    Modified Rankin (Stroke Patients Only)       Balance Overall balance assessment:  Needs assistance Sitting-balance support: No upper extremity supported;Feet supported Sitting balance-Leahy Scale: Good     Standing balance support: Bilateral upper extremity supported Standing balance-Leahy Scale: Fair                              Cognition Arousal/Alertness: Awake/alert Behavior During Therapy: WFL for tasks assessed/performed Overall Cognitive Status: Within Functional Limits for tasks assessed                                        Exercises Other Exercises Other Exercises: Supine L LE therex, 1x5, AROM for muscular reassessment.  Good L quad control/activation; little lag noted with SLR. Other Exercises: Sit/stand x2 with RW, cga/min assist-cuing for hand placement and overall mechanics    General Comments        Pertinent Vitals/Pain Pain Assessment: 0-10 Pain Score: 7  Pain Location: L knee Pain Descriptors / Indicators: Aching;Guarding Pain Intervention(s): Limited activity within patient's tolerance;Monitored during session;Premedicated before session;Repositioned    Home Living                      Prior Function            PT Goals (current goals can now be found in the care plan section) Acute Rehab PT Goals Patient Stated Goal: To be  able to fish again PT Goal Formulation: With patient Time For Goal Achievement: 10/14/17 Potential to Achieve Goals: Good Progress towards PT goals: Progressing toward goals    Frequency    BID      PT Plan Current plan remains appropriate    Co-evaluation              AM-PAC PT "6 Clicks" Daily Activity  Outcome Measure  Difficulty turning over in bed (including adjusting bedclothes, sheets and blankets)?: A Little Difficulty moving from lying on back to sitting on the side of the bed? : A Little Difficulty sitting down on and standing up from a chair with arms (e.g., wheelchair, bedside commode, etc,.)?: A Little Help needed moving to and from a bed  to chair (including a wheelchair)?: A Little Help needed walking in hospital room?: A Little Help needed climbing 3-5 steps with a railing? : A Lot 6 Click Score: 17    End of Session Equipment Utilized During Treatment: Gait belt Activity Tolerance: Patient tolerated treatment well Patient left: in bed;with call bell/phone within reach;with bed alarm set;with family/visitor present Nurse Communication: Mobility status PT Visit Diagnosis: Other abnormalities of gait and mobility (R26.89);Muscle weakness (generalized) (M62.81);Pain Pain - Right/Left: Left Pain - part of body: Knee     Time: 3888-7579 PT Time Calculation (min) (ACUTE ONLY): 26 min  Charges:  $Gait Training: 8-22 mins $Therapeutic Exercise: 8-22 mins                    G Codes:       Jeidy Hoerner H. Owens Shark, PT, DPT, NCS 10/01/17, 9:55 PM [phone]

## 2017-10-01 NOTE — Clinical Social Work Placement (Signed)
   CLINICAL SOCIAL WORK PLACEMENT  NOTE  Date:  10/01/2017  Patient Details  Name: NIKLAUS MAMARIL MRN: [id number] Date of Birth: Feb 13, 1936  Clinical Social Work is seeking post-discharge placement for this patient at the [place] level of care (*CSW will initial, date and re-position this form in  chart as items are completed):  Yes   Patient/family provided with [place] Work Department's list of facilities offering this level of care within the geographic area requested by the patient (or if unable, by the patient's family).  Yes   Patient/family informed of their freedom to choose among providers that offer the needed level of care, that participate in Medicare, Medicaid or managed care program needed by the patient, have an available bed and are willing to accept the patient.  Yes   Patient/family informed of [facility]'s ownership interest in [facility] and [facility], as well as of the fact that they are under no obligation to receive care at these facilities.  PASRR submitted to EDS on 09/30/17     PASRR number received on 09/30/17     Existing PASRR number confirmed on       FL2 transmitted to all facilities in geographic area requested by pt/family on 10/01/17     FL2 transmitted to all facilities within larger geographic area on       Patient informed that his/her managed care company has contracts with or will negotiate with certain facilities, including the following:        Yes   Patient/family informed of bed offers received.  Patient chooses bed at [facility] )     Physician recommends and patient chooses bed at      Patient to be transferred to   on  .  Patient to be transferred to facility by       Patient family notified on   of transfer.  Name of family member notified:        PHYSICIAN       Additional Comment:    _______________________________________________ Devontae Casasola, Veronia Beets,  LCSW 10/01/2017, 2:17 PM

## 2017-10-01 NOTE — Evaluation (Signed)
Physical Therapy Evaluation Patient Details Name: Joseph Houston MRN: [id number] DOB: [birth year] Today's Date: 10/01/2017   History of Present Illness  Pt is a 9 you M with a PMH that includes: HTN, CAD, dysrhythmia s/p pacemaker, COPD, and OA of the L knee.  Pt is now s/p elective L TKA.     Clinical Impression  Pt presents with deficits in strength, transfers, mobility, gait, L knee ROM, balance, and activity tolerance.  Pt required significantly increased time and effort with bed mobility tasks and close CGA during transfers secondary to instability upon initial stand.  After standing and performing some L to R weight shifting and low intensity marching in place pt began to c/o feeling dizzy and was returned to sitting at EOB.  Pt reported no relief in sitting and was returned to supine with BP taken at 118/53 mmHg compared to baseline in supine of 140/66 mmHg, nursing notified.  Pt's SpO2 remained in the mid 90s during session on room air with HR in the mid to upper 60s.  Pt will benefit from PT services in a SNF setting upon discharge to safely address above deficits for decreased caregiver assistance and eventual return to PLOF.      Follow Up Recommendations SNF    Equipment Recommendations  Rolling walker with 5" wheels    Recommendations for Other Services       Precautions / Restrictions Precautions Precautions: Knee;Fall Precaution Booklet Issued: Yes (comment) Precaution Comments: HEP education and review per TKA handouts Required Braces or Orthoses: (Pt able to do Ind LLE SLR without ext lag, no KI required) Restrictions Weight Bearing Restrictions: Yes LLE Weight Bearing: Weight bearing as tolerated      Mobility  Bed Mobility Overal bed mobility: Needs Assistance Bed Mobility: Supine to Sit;Sit to Supine     Supine to sit: Supervision Sit to supine: Supervision   General bed mobility comments: Effortful with increased time required for all bed mobility  tasks but no physical assistance required  Transfers Overall transfer level: Needs assistance Equipment used: Rolling walker (2 wheeled) Transfers: Sit to/from Stand Sit to Stand: Min guard         General transfer comment: Mod verbal cues for sequencing with min instability upon standing but pt able to self-correct  Ambulation/Gait             General Gait Details: Pt able to perform standing weight shifting and some effortful marching in place but then reported feeling dizzy that did not resolve upon sitting, amb deferred for safety  Stairs            Wheelchair Mobility    Modified Rankin (Stroke Patients Only)       Balance Overall balance assessment: Needs assistance Sitting-balance support: Feet unsupported;Feet supported;Bilateral upper extremity supported Sitting balance-Leahy Scale: Good     Standing balance support: Bilateral upper extremity supported Standing balance-Leahy Scale: Poor Standing balance comment: Poor standing balance upon initial stand progressing to fair with heavy reliance on BUEs on RW                             Pertinent Vitals/Pain Pain Assessment: 0-10 Pain Score: 5  Pain Location: L knee Pain Descriptors / Indicators: Aching;Operative site guarding;Sore Pain Intervention(s): Premedicated before session;Monitored during session    [place] expects to be discharged to:: Private residence Living Arrangements: Spouse/significant other Available Help at Discharge: Family;Available PRN/intermittently;Other (Comment)(Spouse works 3 hrs in  am and 3 hrs in pm as school bus driver, no other assist available) Type of Home: House Home Access: Stairs to enter Entrance Stairs-Rails: Left Entrance Stairs-Number of Steps: 4 Home Layout: One level Home Equipment: None      Prior Function Level of Independence: Independent         Comments: Ind amb without AD with 3-4 falls in the last 6 months that  per pt is secondary to stiffness in the L knee causing tripping; Ind with ADLs      Hand Dominance        Extremity/Trunk Assessment   Upper Extremity Assessment Upper Extremity Assessment: Overall WFL for tasks assessed    Lower Extremity Assessment Lower Extremity Assessment: Generalized weakness;LLE deficits/detail LLE Deficits / Details: L hip flex 3/5, good L ankle DF and PF strength against resistance, knee strength NT secondary to pain LLE: Unable to fully assess due to pain       Communication   Communication: HOH  Cognition Arousal/Alertness: Awake/alert Behavior During Therapy: WFL for tasks assessed/performed Overall Cognitive Status: Within Functional Limits for tasks assessed                                        General Comments      Exercises Total Joint Exercises Ankle Circles/Pumps: AROM;Both;5 reps;10 reps Quad Sets: AROM;Strengthening;Left;5 reps;10 reps Gluteal Sets: Strengthening;Both;10 reps Hip ABduction/ADduction: AROM;Both;5 reps Straight Leg Raises: AROM;Both;10 reps Long Arc Quad: AROM;Left;5 reps;10 reps Knee Flexion: AROM;Left;5 reps;10 reps Goniometric ROM: L knee AROM: 3-65 and limited by pain Marching in Standing: AROM;Both;10 reps Other Exercises Other Exercises: HEP education per TKA handout  Pt education on positioning and proper sequencing with bed mob and transfers   Assessment/Plan    PT Assessment Patient needs continued PT services  PT Problem List Decreased strength;Decreased range of motion;Decreased activity tolerance;Decreased balance;Decreased mobility;Decreased knowledge of use of DME       PT Treatment Interventions DME instruction;Gait training;Stair training;Functional mobility training;Balance training;Therapeutic exercise;Therapeutic activities;Patient/family education    PT Goals (Current goals can be found in the Care Plan section)  Acute Rehab PT Goals Patient Stated Goal: To be able to  fish again PT Goal Formulation: With patient Time For Goal Achievement: 10/14/17 Potential to Achieve Goals: Good    Frequency BID   Barriers to discharge Decreased caregiver support;Inaccessible home environment      Co-evaluation               AM-PAC PT "6 Clicks" Daily Activity  Outcome Measure Difficulty turning over in bed (including adjusting bedclothes, sheets and blankets)?: A Lot Difficulty moving from lying on back to sitting on the side of the bed? : A Lot Difficulty sitting down on and standing up from a chair with arms (e.g., wheelchair, bedside commode, etc,.)?: Unable Help needed moving to and from a bed to chair (including a wheelchair)?: A Little Help needed walking in hospital room?: A Lot Help needed climbing 3-5 steps with a railing? : A Lot 6 Click Score: 12    End of Session Equipment Utilized During Treatment: Gait belt Activity Tolerance: Treatment limited secondary to medical complications (Comment)(Pt limited by dizziness in standing, nsg notified) Patient left: in bed;with call bell/phone within reach;with bed alarm set;with family/visitor present Nurse Communication: Mobility status;Other (comment)(dizziness in standing) PT Visit Diagnosis: Other abnormalities of gait and mobility (R26.89);Muscle weakness (generalized) (M62.81);Pain Pain - Right/Left:  Left Pain - part of body: Knee    Time: 7989-2119 PT Time Calculation (min) (ACUTE ONLY): 41 min   Charges:   PT Evaluation $PT Eval Low Complexity: 1 Low PT Treatments $Therapeutic Exercise: 8-22 mins $Therapeutic Activity: 8-22 mins   PT G Codes:        DRoyetta Asal PT, DPT 10/01/17, 11:16 AM

## 2017-10-01 NOTE — Anesthesia Postprocedure Evaluation (Signed)
Anesthesia Post Note  Patient: Joseph Houston  Procedure(s) Performed: TOTAL KNEE ARTHROPLASTY (Left )  Patient location during evaluation: Nursing Unit Anesthesia Type: Spinal Level of consciousness: awake, awake and alert and oriented Pain management: pain level controlled Vital Signs Assessment: post-procedure vital signs reviewed and stable Respiratory status: spontaneous breathing, nonlabored ventilation and respiratory function stable Cardiovascular status: stable Anesthetic complications: no     Last Vitals:  Vitals:   10/01/17 0428 10/01/17 0431  BP: (!) 93/44 (!) 108/57  Pulse: 62 64  Resp:    Temp: 36.9 C   SpO2: 96% 95%    Last Pain:  Vitals:   10/01/17 0648  TempSrc:   PainSc: 10-Worst pain ever                 [employer]

## 2017-10-02 LAB — BASIC METABOLIC PANEL
ANION GAP: 6 (ref 5–15)
BUN: 22 mg/dL — ABNORMAL HIGH (ref 6–20)
CALCIUM: 8.7 mg/dL — AB (ref 8.9–10.3)
CO2: 28 mmol/L (ref 22–32)
Chloride: 100 mmol/L — ABNORMAL LOW (ref 101–111)
Creatinine, Ser: 1.23 mg/dL (ref 0.61–1.24)
GFR, EST NON AFRICAN AMERICAN: 54 mL/min — AB (ref >60)
Glucose, Bld: 135 mg/dL — ABNORMAL HIGH (ref 65–99)
Potassium: 4.5 mmol/L (ref 3.5–5.1)
Sodium: 134 mmol/L — ABNORMAL LOW (ref 135–145)

## 2017-10-02 LAB — CBC
HCT: 39.1 % — ABNORMAL LOW (ref 40.0–52.0)
Hemoglobin: 13 g/dL (ref 13.0–18.0)
MCH: 30.1 pg (ref 26.0–34.0)
MCHC: 33.3 g/dL (ref 32.0–36.0)
MCV: 90.3 fL (ref 80.0–100.0)
PLATELETS: 148 10*3/uL — AB (ref 150–440)
RBC: 4.32 MIL/uL — ABNORMAL LOW (ref 4.40–5.90)
RDW: 14.2 % (ref 11.5–14.5)
WBC: 9.6 10*3/uL (ref 3.8–10.6)

## 2017-10-02 MED ORDER — OXYCODONE HCL 5 MG PO TABS: 5.0000 mg | ORAL_TABLET | ORAL | 0 refills | Status: DC | PRN

## 2017-10-02 MED ORDER — METHOCARBAMOL 500 MG PO TABS: 500.0000 mg | ORAL_TABLET | Freq: Three times a day (TID) | ORAL | 0 refills | Status: DC

## 2017-10-02 MED ORDER — DOCUSATE SODIUM 100 MG PO CAPS: 100.0000 mg | ORAL_CAPSULE | Freq: Two times a day (BID) | ORAL | 0 refills | Status: DC

## 2017-10-02 NOTE — Clinical Social Work Placement (Signed)
   CLINICAL SOCIAL WORK PLACEMENT  NOTE  Date:  10/02/2017  Patient Details  Name: Joseph Houston MRN: [id number] Date of Birth: [birth year]  Clinical Social Work is seeking post-discharge placement for this patient at the [place] level of care (*CSW will initial, date and re-position this form in  chart as items are completed):  Yes   Patient/family provided with [place] Work Department's list of facilities offering this level of care within the geographic area requested by the patient (or if unable, by the patient's family).  Yes   Patient/family informed of their freedom to choose among providers that offer the needed level of care, that participate in Medicare, Medicaid or managed care program needed by the patient, have an available bed and are willing to accept the patient.  Yes   Patient/family informed of [facility]'s ownership interest in [facility] and [facility], as well as of the fact that they are under no obligation to receive care at these facilities.  PASRR submitted to EDS on 09/30/17     PASRR number received on 09/30/17     Existing PASRR number confirmed on       FL2 transmitted to all facilities in geographic area requested by pt/family on 10/01/17     FL2 transmitted to all facilities within larger geographic area on       Patient informed that his/her managed care company has contracts with or will negotiate with certain facilities, including the following:        Yes   Patient/family informed of bed offers received.  Patient chooses bed at [facility], [facility], [facility] )     Physician recommends and patient chooses bed at      Patient to be transferred to [facility] ) on 10/02/17.  Patient to be transferred to facility by [facility] )     Patient family notified on 10/02/17 of transfer.  Name of family member notified:  (Patient's wife [name] is at bedside and aware of D/C today. )     PHYSICIAN       Additional Comment:    _______________________________________________ Zunaira Lamy, Veronia Beets, LCSW 10/02/2017, 12:09 PM

## 2017-10-02 NOTE — Progress Notes (Signed)
Physical Therapy Treatment Patient Details Name: Joseph Houston MRN: [id number] DOB: 1936/03/10 Today's Date: 10/02/2017    History of Present Illness Pt is a 19 you M with a PMH that includes: HTN, CAD, dysrhythmia s/p pacemaker, COPD, and OA of the L knee.  Pt is now s/p elective L TKA.     PT Comments    PT split session this am.   9:48-9:58 Pt reports increased pain this am.  Participated in exercises as described below with no relief of pain from exercises.  Requested and received pain medication from nursing.  10:47-11:00 Returned for session once pain medication was given time to work.  Pt reports little relief of pain but agrees to try mobility.  Voiced frustration over his perception of a setback.  Education and encouragement given.  Discussed expectations for recovery.  No increased redness/edema etc.  To edge of bed with light min a for LE management.  Stood with min assist x 1 with unsteadiness due to pain.  He was able to take several small steps to chair but overall gait progression was limited by pain.  Remained up in chair with polar care in place.   Follow Up Recommendations  SNF     Equipment Recommendations  Rolling walker with 5" wheels    Recommendations for Other Services       Precautions / Restrictions Precautions Precautions: Knee;Fall Restrictions Weight Bearing Restrictions: Yes LLE Weight Bearing: Weight bearing as tolerated    Mobility  Bed Mobility Overal bed mobility: Needs Assistance Bed Mobility: Supine to Sit     Supine to sit: Min assist     General bed mobility comments: assist for LLE management today  Transfers Overall transfer level: Needs assistance Equipment used: Rolling walker (2 wheeled) Transfers: Sit to/from Stand Sit to Stand: Min assist         General transfer comment: guarded transition with decreased active use o fL LE  Ambulation/Gait Ambulation/Gait assistance: Min assist Ambulation Distance (Feet): 5  Feet Assistive device: Rolling walker (2 wheeled) Gait Pattern/deviations: Step-to pattern   Gait velocity interpretation: <1.8 ft/sec, indicative of risk for recurrent falls     Stairs            Wheelchair Mobility    Modified Rankin (Stroke Patients Only)       Balance Overall balance assessment: Needs assistance Sitting-balance support: No upper extremity supported;Feet supported Sitting balance-Leahy Scale: Good     Standing balance support: Bilateral upper extremity supported Standing balance-Leahy Scale: Fair Standing balance comment: Poor standing balance upon initial stand progressing to fair with heavy reliance on BUEs on RW                            Cognition Arousal/Alertness: Awake/alert Behavior During Therapy: [facility] for tasks assessed/performed Overall Cognitive Status: Within Functional Limits for tasks assessed                                        Exercises Total Joint Exercises Ankle Circles/Pumps: AROM;Both;10 reps Quad Sets: AROM;Strengthening;Left;10 reps Gluteal Sets: Strengthening;Both;10 reps Heel Slides: 10 reps;AAROM;Left;Supine Hip ABduction/ADduction: 10 reps;Left;AAROM;Supine Straight Leg Raises: 10 reps;Left;AAROM;Supine Long Arc Quad: Left;10 reps;AAROM;Supine Knee Flexion: Left;10 reps;AAROM;Supine Goniometric ROM: 3-72    General Comments        Pertinent Vitals/Pain Pain Assessment: 0-10 Pain Score: 10-Worst pain ever Pain Location:  L knee Pain Descriptors / Indicators: Aching;Guarding Pain Intervention(s): Limited activity within patient's tolerance;Premedicated before session;Monitored during session;Ice applied    Home Living                      Prior Function            PT Goals (current goals can now be found in the care plan section) Progress towards PT goals: Progressing toward goals    Frequency    BID      PT Plan Current plan remains appropriate     Co-evaluation              AM-PAC PT "6 Clicks" Daily Activity  Outcome Measure  Difficulty turning over in bed (including adjusting bedclothes, sheets and blankets)?: A Little Difficulty moving from lying on back to sitting on the side of the bed? : A Little Difficulty sitting down on and standing up from a chair with arms (e.g., wheelchair, bedside commode, etc,.)?: Unable Help needed moving to and from a bed to chair (including a wheelchair)?: A Little Help needed walking in hospital room?: A Little Help needed climbing 3-5 steps with a railing? : A Lot 6 Click Score: 15    End of Session Equipment Utilized During Treatment: Gait belt Activity Tolerance: Patient tolerated treatment well Patient left: in chair;with chair alarm set;with call bell/phone within reach Nurse Communication: Patient requests pain meds       Time: 1037-1100 PT Time Calculation (min) (ACUTE ONLY): 23 min  Charges:  $Gait Training: 8-22 mins $Therapeutic Exercise: 8-22 mins                    G Codes:       Chesley Noon, PTA 10/02/17, 11:24 AM

## 2017-10-02 NOTE — Progress Notes (Signed)
Patient is medically stable for D/C to [facility] today. Per [facility] admissions coordinator at [facility] patient can come today to room 509 and [facility] SNF authorization has been received. Clinical Education officer, museum (CSW) sent D/C orders to [facility] via [place]. RN will call report to 500 hall nurse at [phone] and arrange EMS for transport. Patient and his wife [name] are aware of above. Please reconsult if future social work needs arise. CSW signing off.   [employer], LCSW [phone]

## 2017-10-02 NOTE — Progress Notes (Addendum)
   Subjective: 2 Days Post-Op Procedure(s) (LRB): TOTAL KNEE ARTHROPLASTY (Left) Patient reports pain as 10 on 0-10 scale.  Started to take 10 mg oxycodone vrs 5 mg. Just received pain medications. Patient is well, and has had no acute complaints or problems. Denies any CP, SOB, ABD pain. We will continue therapy today.  .  Objective: Vital signs in last 24 hours: Temp:  [97.3 F (36.3 C)-98.6 F (37 C)] 98.1 F (36.7 C) (02/28 0421) Pulse Rate:  [60-71] 63 (02/28 0421) Resp:  [18] 18 (02/28 0421) BP: (118-152)/(53-78) 145/69 (02/28 0421) SpO2:  [90 %-98 %] 90 % (02/28 0421)  Intake/Output from previous day: 02/27 0701 - 02/28 0700 In: 2536.3 [P.O.:1320; I.V.:1216.3] Out: 1750 [Urine:1750] Intake/Output this shift: No intake/output data recorded.  Recent Labs    10/01/17 0522 10/02/17 0304  HGB 12.9* 13.0   Recent Labs    10/01/17 0522 10/02/17 0304  WBC 7.7 9.6  RBC 4.29* 4.32*  HCT 38.8* 39.1*  PLT 152 148*   Recent Labs    10/01/17 0522 10/02/17 0304  NA 135 134*  K 4.1 4.5  CL 102 100*  CO2 26 28  BUN 23* 22*  CREATININE 1.33* 1.23  GLUCOSE 130* 135*  CALCIUM 8.3* 8.7*   No results for input(s): LABPT, INR in the last 72 hours.  EXAM General - Patient is Alert, Appropriate and Oriented Extremity - Neurovascular intact Sensation intact distally Intact pulses distally Dorsiflexion/Plantar flexion intact No cellulitis present Compartment soft Dressing - dressing C/D/I and wound vac intact less than 50 cc drainage Motor Function - intact, moving foot and toes well on exam.   Past Medical History:  Diagnosis Date  . Anemia    was treated in the past, not now  . Anxiety   . Arthritis    knees  . BPH (benign prostatic hypertrophy)   . COPD (chronic obstructive pulmonary disease) ([place])    has had most of his life. has not used an inhaler for years  . Coronary artery disease   . Difficult intubation    pt reports bad sore throat after  surgery.  [name] Dyspnea   . Dysrhythmia 2016  . GERD (gastroesophageal reflux disease)   . Headache   . History of hiatal hernia   . History of kidney stones   . Hypertension   . Pre-diabetes   . Presence of permanent cardiac pacemaker   . Restless leg   . Vertigo 1992    Assessment/Plan:   2 Days Post-Op Procedure(s) (LRB): TOTAL KNEE ARTHROPLASTY (Left) Active Problems:   Primary localized osteoarthritis of left knee  Estimated body mass index is 32.07 kg/m as calculated from the following:   Height as of this encounter: 5\' 11"  (1.803 m).   Weight as of this encounter: 104.3 kg (229 lb 15 oz). Advance diet Up with therapy  Needs BM Labs are stable VS stable Pain seems to be well controlled. Patient does not appear to be in any distress. Plan on discharge to SNF today or tomorrow pending BM and insurance authorization  DVT Prophylaxis - Foot Pumps, TED hose and eliquis Weight-Bearing as tolerated to left leg   T. Rachelle Hora, PA-C [place] 10/02/2017, 7:22 AM

## 2017-10-02 NOTE — Discharge Instructions (Signed)

## 2017-10-02 NOTE — Discharge Summary (Signed)
Physician Discharge Summary  Patient ID: Joseph Houston MRN: [id number] DOB/AGE: 07-Nov-1935 82 y.o.  Admit date: 09/30/2017 Discharge date: 10/02/2017  Admission Diagnoses:  PRIMARY LOCALIZED OSTEOARTHRITIS OF LEFT KNEE   Discharge Diagnoses: Patient Active Problem List   Diagnosis Date Noted  . Primary localized osteoarthritis of left knee 09/30/2017  . Sick sinus syndrome ([place]) 07/17/2016  . A-fib ([place]) 12/22/2014  . 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 Acid reflux 12/22/2014  . BP (high blood pressure) 12/22/2014  . Chronic anemia 11/08/2014    Past Medical History:  Diagnosis Date  . Anemia    was treated in the past, not now  . Anxiety   . Arthritis    knees  . BPH (benign prostatic hypertrophy)   . COPD (chronic obstructive pulmonary disease) ([place])    has had most of his life. has not used an inhaler for years  . Coronary artery disease   . Difficult intubation    pt reports bad sore throat after surgery.  Joseph Houston Dyspnea   . Dysrhythmia 2016  . GERD (gastroesophageal reflux disease)   . Headache   . History of hiatal hernia   . History of kidney stones   . Hypertension   . Pre-diabetes   . Presence of permanent cardiac pacemaker   . Restless leg   . Vertigo 1992     Transfusion: none   Consultants (if any):   Discharged Condition: Improved  Hospital Course: KOLBEY TEICHERT is an 82 y.o. male who was admitted 09/30/2017 with a diagnosis of <principal problem not specified> and went to the operating room on 09/30/2017 and underwent the above named procedures.    Surgeries: Procedure(s): TOTAL KNEE ARTHROPLASTY on 09/30/2017 Patient tolerated the surgery well. Taken to PACU where she was stabilized and then transferred to the orthopedic floor.  Started on Eliquis 5 mg BID. Foot pumps applied bilaterally at 80 mm. Heels elevated on bed with rolled towels. No evidence of DVT. Negative Homan. Physical therapy started on day #1 for gait training and  transfer. OT started day #1 for ADL and assisted devices.  Patient's foley was d/c on day #1. Patient's IV was d/c on day #2.  On post op day #2 patient was stable and ready for discharge to SNF.  Implants: Medacta GMK sphere, 6 femur, 5 tibia with short stem and 10 mm insert, size 3 patella, all components cemented    He was given perioperative antibiotics:  Anti-infectives (From admission, onward)   Start     Dose/Rate Route Frequency Ordered Stop   09/30/17 1600  ceFAZolin (ANCEF) IVPB 2g/100 mL premix     2 g 200 mL/hr over 30 Minutes Intravenous Every 6 hours 09/30/17 1307 10/01/17 0339   09/30/17 0820  ceFAZolin (ANCEF) 2-4 GM/100ML-% IVPB    Comments:  Slemenda, Debra   : cabinet override      09/30/17 0820 09/30/17 0948   09/30/17 0200  ceFAZolin (ANCEF) IVPB 2g/100 mL premix     2 g 200 mL/hr over 30 Minutes Intravenous  Once 09/30/17 0148 09/30/17 0958    .  He was given sequential compression devices, early ambulation, and Eliquis for DVT prophylaxis.  He benefited maximally from the hospital stay and there were no complications.    Please remove provena negative pressure dressing on 10/09/2017 and apply honey comb dressing. Keep dressing clean and dry at all times.   Recent vital signs:  Vitals:   10/02/17 0836 10/02/17 1000  BP: Joseph Houston)  116/56 (!) 154/76  Pulse: 66 62  Resp: 20   Temp: 98.2 F (36.8 C)   SpO2: 92%     Recent laboratory studies:  Lab Results  Component Value Date   HGB 13.0 10/02/2017   HGB 12.9 (L) 10/01/2017   HGB 15.1 09/17/2017   Lab Results  Component Value Date   WBC 9.6 10/02/2017   PLT 148 (L) 10/02/2017   Lab Results  Component Value Date   INR 1.18 09/17/2017   Lab Results  Component Value Date   NA 134 (L) 10/02/2017   K 4.5 10/02/2017   CL 100 (L) 10/02/2017   CO2 28 10/02/2017   BUN 22 (H) 10/02/2017   CREATININE 1.23 10/02/2017   GLUCOSE 135 (H) 10/02/2017    Discharge Medications:   Allergies as of 10/02/2017       Reactions   Dog Epithelium Swelling, Other (See Comments)   Also, cats too   Mold Extract [trichophyton] Swelling, Other (See Comments)   Tightness in throat   Tree Extract    Codeine Nausea And Vomiting   Sulfa Antibiotics Nausea And Vomiting      Medication List    STOP taking these medications   traMADol 50 MG tablet Commonly known as:  ULTRAM     TAKE these medications   acetaminophen 650 MG CR tablet Commonly known as:  TYLENOL Take 1,300 mg by mouth every 8 (eight) hours as needed for pain.   amLODipine 5 MG tablet Commonly known as:  NORVASC Take 1 tablet (5 mg total) by mouth daily.   apixaban 5 MG Tabs tablet Commonly known as:  ELIQUIS Take 5 mg by mouth 2 (two) times daily.   aspirin EC 81 MG tablet Take 81 mg by mouth daily.   docusate sodium 100 MG capsule Commonly known as:  COLACE Take 1 capsule (100 mg total) by mouth 2 (two) times daily.   EPINEPHrine 0.3 mg/0.3 mL Soaj injection Commonly known as:  EPI-PEN Inject 0.3 mg into the muscle once. As needed   FISH OIL PO Take 1 capsule by mouth 2 (two) times daily.   flecainide 100 MG tablet Commonly known as:  TAMBOCOR Take 100 mg by mouth 2 (two) times daily.   fluticasone 50 MCG/ACT nasal spray Commonly known as:  FLONASE Place 2 sprays into both nostrils daily as needed for allergies.   losartan 100 MG tablet Commonly known as:  COZAAR Take 100 mg by mouth at bedtime.   methocarbamol 500 MG tablet Commonly known as:  ROBAXIN Take 1 tablet (500 mg total) by mouth 3 (three) times daily.   metoprolol tartrate 25 MG tablet Commonly known as:  LOPRESSOR Take 25 mg by mouth 2 (two) times daily.   omeprazole 20 MG capsule Commonly known as:  PRILOSEC Take 20 mg by mouth 2 (two) times daily before a meal.   oxyCODONE 5 MG immediate release tablet Commonly known as:  Oxy IR/ROXICODONE Take 1-2 tablets (5-10 mg total) by mouth every 4 (four) hours as needed for moderate pain ((score 4  to 6)).            Durable Medical Equipment  (From admission, onward)        Start     Ordered   09/30/17 1308  DME Walker rolling  Once    Question:  Patient needs a walker to treat with the following condition  Answer:  Status post total knee replacement using cement, left   09/30/17 1307  09/30/17 1308  DME 3 n 1  Once     09/30/17 1307   09/30/17 1308  DME Bedside commode  Once    Question:  Patient needs a bedside commode to treat with the following condition  Answer:  Status post total knee replacement using cement, left   09/30/17 1307      Diagnostic Studies: Dg Knee 1-2 Views Left  Result Date: 09/30/2017 CLINICAL DATA:  S/p TKR EXAM: LEFT KNEE - 1-2 VIEW FINDINGS: LEFT total knee arthroplasty. Prosthetic components in position. Postsurgical gas in the joint. Skin staples noted. IMPRESSION: No complication following total knee arthroplasty. Electronically Signed   By: Suzy Bouchard M.D.   On: 09/30/2017 12:29   Ct Knee Left Wo Contrast  Result Date: 09/04/2017 CLINICAL DATA:  Patient for left knee replacement. Preoperative My Knee planning study. EXAM: CT OF THE LEFT KNEE WITHOUT CONTRAST TECHNIQUE: Multidetector CT imaging of the left knee was performed according to the standard protocol. Multiplanar CT image reconstructions were also generated. Axial imaging only of the left hip and ankle was also performed. COMPARISON:  None. FINDINGS: Bones/Joint/Cartilage No acute or focal abnormality about the left hip or ankle is identified. There is osteoarthritis about the right knee appearing worst in the medial compartment where there is bone-on-bone joint space narrowing. Chondrocalcinosis is identified. No acute abnormality. Ligaments Suboptimally assessed by CT. Muscles and Tendons Intact. There is partial visualization of some fatty atrophy of the medial gastrocnemius. Soft tissues No acute or focal abnormality. Patient appears to be status post left inguinal hernia repair.  IMPRESSION: Left knee osteoarthritis appearing severe in the medial compartment. No acute finding. Electronically Signed   By: Inge Rise M.D.   On: 09/04/2017 15:09    Disposition: 01-Home or Self Care     Contact information for follow-up providers    Hessie Knows, MD Follow up in 2 week(s).   Specialty:  Orthopedic Surgery Contact information: [place] [address] [phone]            Contact information for after-discharge care    Destination    [facility] Follow up.   Service:  Skilled Nursing Contact information: [place] [phone]                   Signed: SHEAN, GERDING 10/02/2017, 10:49 AM

## 2017-10-02 NOTE — Progress Notes (Addendum)
Pt ready for d/c to SNF today per MD. Pt had a very small BM this morning after suppository given Rachelle Hora, [place] okay with size of BM). Hospital wound vac switched to prevena prior to d/c. Report called to [place] at [facility], all questions answered. EMS will be set up for transportation to facility. PIV removed, pt in NAD. Wife is at bedside, took all personal belongings.  [place], Jerry Caras

## 2017-10-06 DIAGNOSIS — M25562 Pain in left knee: Secondary | ICD-10-CM | POA: Insufficient documentation

## 2018-01-15 ENCOUNTER — Other Ambulatory Visit: Payer: Self-pay | Admitting: Internal Medicine

## 2018-01-26 ENCOUNTER — Other Ambulatory Visit: Payer: Self-pay | Admitting: Internal Medicine

## 2018-01-26 DIAGNOSIS — R1031 Right lower quadrant pain: Secondary | ICD-10-CM

## 2018-01-26 DIAGNOSIS — N400 Enlarged prostate without lower urinary tract symptoms: Secondary | ICD-10-CM

## 2018-01-26 DIAGNOSIS — R1032 Left lower quadrant pain: Principal | ICD-10-CM

## 2018-02-02 ENCOUNTER — Ambulatory Visit
Admission: RE | Admit: 2018-02-02 | Discharge: 2018-02-02 | Disposition: A | Discharge status: Home / Self Care | Payer: Medicare Other | Source: Ambulatory Visit | Attending: Internal Medicine | Admitting: Internal Medicine

## 2018-02-02 DIAGNOSIS — N400 Enlarged prostate without lower urinary tract symptoms: Secondary | ICD-10-CM | POA: Diagnosis present

## 2018-02-02 DIAGNOSIS — K5730 Diverticulosis of large intestine without perforation or abscess without bleeding: Secondary | ICD-10-CM | POA: Diagnosis not present

## 2018-02-02 DIAGNOSIS — I700 Atherosclerosis of aorta: Secondary | ICD-10-CM | POA: Diagnosis not present

## 2018-02-02 DIAGNOSIS — N201 Calculus of ureter: Secondary | ICD-10-CM | POA: Diagnosis not present

## 2018-02-02 DIAGNOSIS — M47816 Spondylosis without myelopathy or radiculopathy, lumbar region: Secondary | ICD-10-CM | POA: Diagnosis not present

## 2018-02-02 DIAGNOSIS — R1032 Left lower quadrant pain: Secondary | ICD-10-CM | POA: Insufficient documentation

## 2018-02-02 DIAGNOSIS — Z9889 Other specified postprocedural states: Secondary | ICD-10-CM | POA: Insufficient documentation

## 2018-02-02 DIAGNOSIS — R1031 Right lower quadrant pain: Secondary | ICD-10-CM | POA: Diagnosis present

## 2018-02-02 DIAGNOSIS — M47814 Spondylosis without myelopathy or radiculopathy, thoracic region: Secondary | ICD-10-CM | POA: Diagnosis not present

## 2018-03-17 ENCOUNTER — Ambulatory Visit: Payer: Medicare Other | Admitting: Urology

## 2018-03-17 ENCOUNTER — Other Ambulatory Visit: Payer: Self-pay | Admitting: Radiology

## 2018-03-17 ENCOUNTER — Encounter: Payer: Self-pay | Admitting: Urology

## 2018-03-17 VITALS — BP 118/70 | HR 74 | Ht 71.0 in | Wt 242.0 lb

## 2018-03-17 DIAGNOSIS — N183 Chronic kidney disease, stage 3 unspecified: Secondary | ICD-10-CM

## 2018-03-17 DIAGNOSIS — N201 Calculus of ureter: Secondary | ICD-10-CM

## 2018-03-17 LAB — URINALYSIS, COMPLETE
Bilirubin, UA: NEGATIVE
Glucose, UA: NEGATIVE
Ketones, UA: NEGATIVE
Leukocytes, UA: NEGATIVE
Nitrite, UA: NEGATIVE
PH UA: 5.5 (ref 5.0–7.5)
PROTEIN UA: NEGATIVE
Specific Gravity, UA: 1.025 (ref 1.005–1.030)
UUROB: 1 mg/dL (ref 0.2–1.0)

## 2018-03-17 LAB — MICROSCOPIC EXAMINATION
Epithelial Cells (non renal): NONE SEEN /hpf (ref 0–10)
RBC, UA: NONE SEEN /hpf (ref 0–2)
WBC UA: NONE SEEN /HPF (ref 0–5)

## 2018-03-17 NOTE — Progress Notes (Signed)
03/17/2018 9:02 AM   Joseph Houston 12/23/1935 [id number]  Referring provider: Tracie Harrier, MD [address] [facility] [place], [place] [address]  Chief Complaint  Patient presents with  . Nephrolithiasis    New Patient    HPI: 82 year old male who presents today for further evaluation of right nonobstructing ureteral calculus.  He was seen and evaluated by his primary care physician in June with lower abdominal pain.  He reports that this is chronic and persistent over the last year.  As part of his further evaluation for this, he is put on Cipro Flagyl and underwent noncontrast CT scan for further evaluation.  This revealed an incidental 5 mm right distal ureteral calculus which appears to be nonobstructing without evidence of hydronephrosis.  In retrospect, and review of CT scan from 09/2016, it is appear that the stone was within the ureter at that point in time, the level of the iliacs and has progressed somewhat since then.  It was also present on a CT scan 3 years ago but within the kidney which he was aware.  He denies flank pain, gross hematuria, or any other symptoms of the stone.  He does have personal history of stones in the past past several spontaneously but never required surgical intervention.  He was previously followed by Dr. Bernardo Heater.  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  He is also had a TURP procedure.  He denies any voiding symptoms today.  He feels like he is able to empty his bladder without urgency or frequency.  No dysuria.  He does have a personal history of arrhythmia status post pacemaker.  He is on chronic Coumadin.  He is followed by Dr. Ubaldo Glassing.   PMH: Past Medical History:  Diagnosis Date  . Anemia    was treated in the past, not now  . Anxiety   . Arthritis    knees  . BPH (benign prostatic hypertrophy)   . COPD (chronic obstructive pulmonary disease) ([place])    has had  most of his life. has not used an inhaler for years  . Coronary artery disease   . Difficult intubation    pt reports bad sore throat after surgery.  [name] Dyspnea   . Dysrhythmia 2016  . GERD (gastroesophageal reflux disease)   . Headache   . History of hiatal hernia   . History of kidney stones   . Hypertension   . Pre-diabetes   . Presence of permanent cardiac pacemaker   . Restless leg   . Vertigo 1992    Surgical History: Past Surgical History:  Procedure Laterality Date  . CATARACT EXTRACTION W/PHACO Right 09/25/2016   Procedure: CATARACT EXTRACTION PHACO AND INTRAOCULAR LENS PLACEMENT (IOC);  Surgeon: Estill Cotta, MD;  Location: [facility] ORS;  Service: Ophthalmology;  Laterality: Right;  [place] 01:58 AP% 18.1 CDE 43.36 Fluid pack # [id number]  . ELECTROPHYSIOLOGIC STUDY N/A 12/22/2014   Procedure: CARDIOVERSION;  Surgeon: Teodoro Spray, MD;  Location: [facility] ORS;  Service: Cardiovascular;  Laterality: N/A;  . FINGER SURGERY Right 1950   index finger  . HERNIA REPAIR    . INSERT / REPLACE / REMOVE PACEMAKER     12/17  . JOINT REPLACEMENT Right 2012   partial knee  . PACEMAKER INSERTION Left 07/17/2016   Procedure: INSERTION PACEMAKER;  Surgeon: Isaias Cowman, MD;  Location: [facility] ORS;  Service: Cardiovascular;  Laterality: Left;  . TONSILLECTOMY  1950  . TOTAL KNEE  ARTHROPLASTY Left 09/30/2017   Procedure: TOTAL KNEE ARTHROPLASTY;  Surgeon: Hessie Knows, MD;  Location: [facility] ORS;  Service: Orthopedics;  Laterality: Left;  . TRANSURETHRAL RESECTION OF PROSTATE      Home Medications:  Allergies as of 03/17/2018      Reactions   Dog Epithelium Swelling, Other (See Comments)   Also, cats too   Mold Extract [trichophyton] Swelling, Other (See Comments)   Tightness in throat   Tree Extract    Codeine Nausea And Vomiting   Sulfa Antibiotics Nausea And Vomiting      Medication List        Accurate as of 03/17/18  9:02 AM. Always use your most recent med list.           acetaminophen 650 MG CR tablet Commonly known as:  TYLENOL Take 1,300 mg by mouth every 8 (eight) hours as needed for pain.   amLODipine 5 MG tablet Commonly known as:  NORVASC Take 1 tablet (5 mg total) by mouth daily.   apixaban 5 MG Tabs tablet Commonly known as:  ELIQUIS Take 5 mg by mouth 2 (two) times daily.   aspirin EC 81 MG tablet Take 81 mg by mouth daily.   EPINEPHrine 0.3 mg/0.3 mL Soaj injection Commonly known as:  EPI-PEN Inject 0.3 mg into the muscle once. As needed   FISH OIL PO Take 1 capsule by mouth 2 (two) times daily.   flecainide 100 MG tablet Commonly known as:  TAMBOCOR Take 100 mg by mouth 2 (two) times daily.   fluticasone 50 MCG/ACT nasal spray Commonly known as:  FLONASE Place 2 sprays into both nostrils daily as needed for allergies.   losartan 100 MG tablet Commonly known as:  COZAAR Take 100 mg by mouth at bedtime.   methocarbamol 500 MG tablet Commonly known as:  ROBAXIN Take 1 tablet (500 mg total) by mouth 3 (three) times daily.   metoprolol succinate 25 MG 24 hr tablet Commonly known as:  TOPROL-XL   metoprolol tartrate 25 MG tablet Commonly known as:  LOPRESSOR Take 25 mg by mouth 2 (two) times daily.   omeprazole 20 MG capsule Commonly known as:  PRILOSEC   warfarin 5 MG tablet Commonly known as:  COUMADIN Take 5 mg by mouth daily.       Allergies:  Allergies  Allergen Reactions  . Dog Epithelium Swelling and Other (See Comments)    Also, cats too  . Mold Extract [Trichophyton] Swelling and Other (See Comments)    Tightness in throat  . Tree Extract   . Codeine Nausea And Vomiting  . Sulfa Antibiotics Nausea And Vomiting    Family History: No family history on file.  Social History:  reports that he quit smoking about 52 years ago. His smoking use included cigarettes. He smoked 2.00 packs per day. He has never used smokeless tobacco. He reports that he does not drink alcohol or use  drugs.  ROS: UROLOGY Frequent Urination?: No Hard to postpone urination?: No Burning/pain with urination?: No Get up at night to urinate?: Yes Leakage of urine?: Yes Urine stream starts and stops?: Yes Trouble starting stream?: No Do you have to strain to urinate?: No Blood in urine?: No Urinary tract infection?: No Sexually transmitted disease?: No Injury to kidneys or bladder?: Yes Painful intercourse?: No Weak stream?: No Erection problems?: Yes Penile pain?: No  Gastrointestinal Nausea?: No Vomiting?: No Indigestion/heartburn?: No Diarrhea?: No Constipation?: No  Constitutional Fever: No Night sweats?: No Weight loss?: Yes  Fatigue?: No  Skin Skin rash/lesions?: Yes Itching?: Yes  Eyes Blurred vision?: No Double vision?: No  Ears/Nose/Throat Sore throat?: No Sinus problems?: Yes  Hematologic/Lymphatic Swollen glands?: No Easy bruising?: No  Cardiovascular Leg swelling?: No Chest pain?: No  Respiratory Cough?: No Shortness of breath?: Yes  Endocrine Excessive thirst?: No  Musculoskeletal Back pain?: Yes Joint pain?: Yes  Neurological Headaches?: Yes Dizziness?: No  Psychologic Depression?: No Anxiety?: No  Physical Exam: BP 118/70   Pulse 74   Ht 5\' 11"  (1.803 m)   Wt 242 lb (109.8 kg)   BMI 33.75 kg/m   Constitutional:  Alert and oriented, No acute distress. HEENT: [place] AT, moist mucus membranes.  Trachea midline, no masses. Cardiovascular: No clubbing, cyanosis, or edema. Respiratory: Normal respiratory effort, no increased work of breathing. GI: Abdomen is soft, nontender, nondistended, no abdominal masses, obese. GU: No CVA tenderness Skin: No rashes, bruises or suspicious lesions. Neurologic: Grossly intact, no focal deficits, moving all 4 extremities. Psychiatric: Normal mood and affect.  Laboratory Data: Creatinine 1.4 on 01/2018, appears to be his baseline  PSA 1.21 on 01/2018  Urinalysis UA personally reviewed  today, see epic  Pertinent Imaging: CLINICAL DATA:  82 year old male post hernia repair, kidney stones and appendectomy presenting with constant lower abdominal pain since July 2018. Post TURP. Initial encounter.  EXAM: CT ABDOMEN AND PELVIS WITHOUT CONTRAST  TECHNIQUE: Multidetector CT imaging of the abdomen and pelvis was performed following the standard protocol without IV contrast.  COMPARISON:  09/23/2016 and 11/02/2013 CT.  FINDINGS: Lower chest: Calcified pleural plaques along the diaphragm as noted previously may reflect result of prior asbestos exposure. Heart size within normal limits. Sequential pacemaker in place. Coronary artery calcifications.  Hepatobiliary: Taking into account limitation by non contrast imaging, no worrisome hepatic lesion. No calcified gallstone or common bile duct stone.  Pancreas: Taking into account limitation by non contrast imaging, no worrisome pancreatic mass or inflammation.  Spleen: Taking into account limitation by non contrast imaging, no splenic mass or enlargement.  Adrenals/Urinary Tract: 5 mm distal right ureteral stone located 1 cm proximal to right ureteral vesicle junction. This is not causing hydronephrosis.  2 mm nonobstructing left renal calculi.  Bilateral renal low-density structures, larger ones are cysts. Others too small to characterize.  No adrenal lesion.  Noncontrast filled imaging of the urinary bladder unremarkable.  Stomach/Bowel: Sigmoid diverticulosis. No extraluminal bowel inflammatory process. No inflammation surrounds the appendix.  No gastric or small bowel abnormality noted.  Vascular/Lymphatic: Atherosclerotic changes aorta and aortic branch vessels. Mild ectasia abdominal aorta without aneurysm.  Scattered normal size lymph nodes without adenopathy.  Reproductive: Slightly enlarged prostate gland. Post TURP. Hypodensity right aspect of the prostate gland  unchanged.  Other: No free intraperitoneal air.  Prior inguinal hernia surgery. No bowel containing hernia. Fat and vessels extend into the inguinal canal bilaterally greater on the right.  Musculoskeletal: Degenerative changes lower thoracic and lumbar spine. Prominent Schmorl's node deformity superior endplate L5. Moderate-to-marked degenerative changes L4-5 with minimal anterior slip L4. Non-specific osseous lesion right ilium with central lucency and well-defined sclerotic borders unchanged.  IMPRESSION: 1. 5 mm distal right ureteral stone located 1 cm proximal to right ureteral vesicle junction. This is not causing hydronephrosis. 2. Bilateral renal low-density structures larger ones which are cysts and others too small to characterize (although statistically likely cysts). 3. Sigmoid diverticulosis without evidence of diverticulitis. 4.  Aortic Atherosclerosis (ICD10-I70.0). 5. Slightly enlarged prostate gland post TURP. 6. Post inguinal hernia surgery without  bowel containing hernia. Fat and vessels extend into the inguinal canal greater on the right. 7. Degenerative changes lower thoracic and lumbar spine most prominent L4-5 level. 8. Calcified pleural plaques diaphragm region bilaterally may reflect changes of prior spaces exposure.   Electronically Signed   By: Genia Del M.D.   On: 02/02/2018 19:13  CT scan was personally reviewed today and with the patient.  This is also compared to CT abdomen pelvis with contrast from 09/23/2016 which shows the same right ureteral stone but at the level of the iliacs at that time.  Assessment & Plan:    1. Right ureteral calculus Asymptomatic nonobstructing 5 mm distal ureteral calculus The stone appears to be quite chronic and was present in the ureter at least a year and half ago as well and is made some but minimal interval progress towards the bladder Given the chronicity of the stone and concern for impending  infection versus obstruction, I recommended surgical intervention Given his multiple medical comorbidities, I feel that he would be best served with ureteroscopy to avoid the risk of arrhythmia with lithotripsy and pursue the most effective procedure in order to limit the overall number of procedures Risks and benefits of ureteroscopy were reviewed including but not limited to infection, bleeding, pain, ureteral injury which could require open surgery versus prolonged indwelling if ureteral perforation occurs, persistent stone disease, requirement for staged procedure, possible stent, and global anesthesia risks. Patient expressed understanding and desires to proceed with ureteroscopy. - Urinalysis, Complete - CULTURE, URINE COMPREHENSIVE  2. CKD (chronic kidney disease) stage 3, GFR 30-59 ml/min (HCC) Stone appears to be nonobstructive and likely not contributing to overall renal function Creatinine appears to be at baseline  He is followed by Dr. Ubaldo Glassing and will need cardiac clearance to hold his anticoagulation.  Hollice Espy, MD  [facility] [address] [place], [place] [address] [phone]

## 2018-03-17 NOTE — H&P (View-Only) (Signed)
03/17/2018 9:02 AM   Joseph Houston 1935/10/08 [id number]  Referring provider: Tracie Harrier, MD [address] [facility] [place],  [address]  Chief Complaint  Patient presents with  . Nephrolithiasis    New Patient    HPI: 82 year old male who presents today for further evaluation of right nonobstructing ureteral calculus.  He was seen and evaluated by his primary care physician in June with lower abdominal pain.  He reports that this is chronic and persistent over the last year.  As part of his further evaluation for this, he is put on Cipro Flagyl and underwent noncontrast CT scan for further evaluation.  This revealed an incidental 5 mm right distal ureteral calculus which appears to be nonobstructing without evidence of hydronephrosis.  In retrospect, and review of CT scan from 09/2016, it is appear that the stone was within the ureter at that point in time, the level of the iliacs and has progressed somewhat since then.  It was also present on a CT scan 3 years ago but within the kidney which he was aware.  He denies flank pain, gross hematuria, or any other symptoms of the stone.  He does have personal history of stones in the past past several spontaneously but never required surgical intervention.  He was previously followed by Dr. Bernardo Heater.  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  He is also had a TURP procedure.  He denies any voiding symptoms today.  He feels like he is able to empty his bladder without urgency or frequency.  No dysuria.  He does have a personal history of arrhythmia status post pacemaker.  He is on chronic Coumadin.  He is followed by Dr. Ubaldo Glassing.   PMH: Past Medical History:  Diagnosis Date  . Anemia    was treated in the past, not now  . Anxiety   . Arthritis    knees  . BPH (benign prostatic hypertrophy)   . COPD (chronic obstructive pulmonary disease) ([place])    has had  most of his life. has not used an inhaler for years  . Coronary artery disease   . Difficult intubation    pt reports bad sore throat after surgery.  [name] Dyspnea   . Dysrhythmia 2016  . GERD (gastroesophageal reflux disease)   . Headache   . History of hiatal hernia   . History of kidney stones   . Hypertension   . Pre-diabetes   . Presence of permanent cardiac pacemaker   . Restless leg   . Vertigo 1992    Surgical History: Past Surgical History:  Procedure Laterality Date  . CATARACT EXTRACTION W/PHACO Right 09/25/2016   Procedure: CATARACT EXTRACTION PHACO AND INTRAOCULAR LENS PLACEMENT (IOC);  Surgeon: Estill Cotta, MD;  Location: [facility] ORS;  Service: Ophthalmology;  Laterality: Right;  [place] 01:58 AP% 18.1 CDE 43.36 Fluid pack # [id number] H  . ELECTROPHYSIOLOGIC STUDY N/A 12/22/2014   Procedure: CARDIOVERSION;  Surgeon: Teodoro Spray, MD;  Location: [facility] ORS;  Service: Cardiovascular;  Laterality: N/A;  . FINGER SURGERY Right 1950   index finger  . HERNIA REPAIR    . INSERT / REPLACE / REMOVE PACEMAKER     12/17  . JOINT REPLACEMENT Right 2012   partial knee  . PACEMAKER INSERTION Left 07/17/2016   Procedure: INSERTION PACEMAKER;  Surgeon: Isaias Cowman, MD;  Location: [facility] ORS;  Service: Cardiovascular;  Laterality: Left;  . TONSILLECTOMY  1950  . TOTAL KNEE  ARTHROPLASTY Left 09/30/2017   Procedure: TOTAL KNEE ARTHROPLASTY;  Surgeon: Hessie Knows, MD;  Location: [facility] ORS;  Service: Orthopedics;  Laterality: Left;  . TRANSURETHRAL RESECTION OF PROSTATE      Home Medications:  Allergies as of 03/17/2018      Reactions   Dog Epithelium Swelling, Other (See Comments)   Also, cats too   Mold Extract [trichophyton] Swelling, Other (See Comments)   Tightness in throat   Tree Extract    Codeine Nausea And Vomiting   Sulfa Antibiotics Nausea And Vomiting      Medication List        Accurate as of 03/17/18  9:02 AM. Always use your most recent med list.           acetaminophen 650 MG CR tablet Commonly known as:  TYLENOL Take 1,300 mg by mouth every 8 (eight) hours as needed for pain.   amLODipine 5 MG tablet Commonly known as:  NORVASC Take 1 tablet (5 mg total) by mouth daily.   apixaban 5 MG Tabs tablet Commonly known as:  ELIQUIS Take 5 mg by mouth 2 (two) times daily.   aspirin EC 81 MG tablet Take 81 mg by mouth daily.   EPINEPHrine 0.3 mg/0.3 mL Soaj injection Commonly known as:  EPI-PEN Inject 0.3 mg into the muscle once. As needed   FISH OIL PO Take 1 capsule by mouth 2 (two) times daily.   flecainide 100 MG tablet Commonly known as:  TAMBOCOR Take 100 mg by mouth 2 (two) times daily.   fluticasone 50 MCG/ACT nasal spray Commonly known as:  FLONASE Place 2 sprays into both nostrils daily as needed for allergies.   losartan 100 MG tablet Commonly known as:  COZAAR Take 100 mg by mouth at bedtime.   methocarbamol 500 MG tablet Commonly known as:  ROBAXIN Take 1 tablet (500 mg total) by mouth 3 (three) times daily.   metoprolol succinate 25 MG 24 hr tablet Commonly known as:  TOPROL-XL   metoprolol tartrate 25 MG tablet Commonly known as:  LOPRESSOR Take 25 mg by mouth 2 (two) times daily.   omeprazole 20 MG capsule Commonly known as:  PRILOSEC   warfarin 5 MG tablet Commonly known as:  COUMADIN Take 5 mg by mouth daily.       Allergies:  Allergies  Allergen Reactions  . Dog Epithelium Swelling and Other (See Comments)    Also, cats too  . Mold Extract [Trichophyton] Swelling and Other (See Comments)    Tightness in throat  . Tree Extract   . Codeine Nausea And Vomiting  . Sulfa Antibiotics Nausea And Vomiting    Family History: No family history on file.  Social History:  reports that he quit smoking about 52 years ago. His smoking use included cigarettes. He smoked 2.00 packs per day. He has never used smokeless tobacco. He reports that he does not drink alcohol or use  drugs.  ROS: UROLOGY Frequent Urination?: No Hard to postpone urination?: No Burning/pain with urination?: No Get up at night to urinate?: Yes Leakage of urine?: Yes Urine stream starts and stops?: Yes Trouble starting stream?: No Do you have to strain to urinate?: No Blood in urine?: No Urinary tract infection?: No Sexually transmitted disease?: No Injury to kidneys or bladder?: Yes Painful intercourse?: No Weak stream?: No Erection problems?: Yes Penile pain?: No  Gastrointestinal Nausea?: No Vomiting?: No Indigestion/heartburn?: No Diarrhea?: No Constipation?: No  Constitutional Fever: No Night sweats?: No Weight loss?: Yes  Fatigue?: No  Skin Skin rash/lesions?: Yes Itching?: Yes  Eyes Blurred vision?: No Double vision?: No  Ears/Nose/Throat Sore throat?: No Sinus problems?: Yes  Hematologic/Lymphatic Swollen glands?: No Easy bruising?: No  Cardiovascular Leg swelling?: No Chest pain?: No  Respiratory Cough?: No Shortness of breath?: Yes  Endocrine Excessive thirst?: No  Musculoskeletal Back pain?: Yes Joint pain?: Yes  Neurological Headaches?: Yes Dizziness?: No  Psychologic Depression?: No Anxiety?: No  Physical Exam: BP 118/70   Pulse 74   Ht 5\' 11"  (1.803 m)   Wt 242 lb (109.8 kg)   BMI 33.75 kg/m   Constitutional:  Alert and oriented, No acute distress. HEENT: [place] AT, moist mucus membranes.  Trachea midline, no masses. Cardiovascular: No clubbing, cyanosis, or edema. Respiratory: Normal respiratory effort, no increased work of breathing. GI: Abdomen is soft, nontender, nondistended, no abdominal masses, obese. GU: No CVA tenderness Skin: No rashes, bruises or suspicious lesions. Neurologic: Grossly intact, no focal deficits, moving all 4 extremities. Psychiatric: Normal mood and affect.  Laboratory Data: Creatinine 1.4 on 01/2018, appears to be his baseline  PSA 1.21 on 01/2018  Urinalysis UA personally reviewed  today, see epic  Pertinent Imaging: CLINICAL DATA:  82 year old male post hernia repair, kidney stones and appendectomy presenting with constant lower abdominal pain since July 2018. Post TURP. Initial encounter.  EXAM: CT ABDOMEN AND PELVIS WITHOUT CONTRAST  TECHNIQUE: Multidetector CT imaging of the abdomen and pelvis was performed following the standard protocol without IV contrast.  COMPARISON:  09/23/2016 and 11/02/2013 CT.  FINDINGS: Lower chest: Calcified pleural plaques along the diaphragm as noted previously may reflect result of prior asbestos exposure. Heart size within normal limits. Sequential pacemaker in place. Coronary artery calcifications.  Hepatobiliary: Taking into account limitation by non contrast imaging, no worrisome hepatic lesion. No calcified gallstone or common bile duct stone.  Pancreas: Taking into account limitation by non contrast imaging, no worrisome pancreatic mass or inflammation.  Spleen: Taking into account limitation by non contrast imaging, no splenic mass or enlargement.  Adrenals/Urinary Tract: 5 mm distal right ureteral stone located 1 cm proximal to right ureteral vesicle junction. This is not causing hydronephrosis.  2 mm nonobstructing left renal calculi.  Bilateral renal low-density structures, larger ones are cysts. Others too small to characterize.  No adrenal lesion.  Noncontrast filled imaging of the urinary bladder unremarkable.  Stomach/Bowel: Sigmoid diverticulosis. No extraluminal bowel inflammatory process. No inflammation surrounds the appendix.  No gastric or small bowel abnormality noted.  Vascular/Lymphatic: Atherosclerotic changes aorta and aortic branch vessels. Mild ectasia abdominal aorta without aneurysm.  Scattered normal size lymph nodes without adenopathy.  Reproductive: Slightly enlarged prostate gland. Post TURP. Hypodensity right aspect of the prostate gland  unchanged.  Other: No free intraperitoneal air.  Prior inguinal hernia surgery. No bowel containing hernia. Fat and vessels extend into the inguinal canal bilaterally greater on the right.  Musculoskeletal: Degenerative changes lower thoracic and lumbar spine. Prominent Schmorl's node deformity superior endplate L5. Moderate-to-marked degenerative changes L4-5 with minimal anterior slip L4. Non-specific osseous lesion right ilium with central lucency and well-defined sclerotic borders unchanged.  IMPRESSION: 1. 5 mm distal right ureteral stone located 1 cm proximal to right ureteral vesicle junction. This is not causing hydronephrosis. 2. Bilateral renal low-density structures larger ones which are cysts and others too small to characterize (although statistically likely cysts). 3. Sigmoid diverticulosis without evidence of diverticulitis. 4.  Aortic Atherosclerosis (ICD10-I70.0). 5. Slightly enlarged prostate gland post TURP. 6. Post inguinal hernia surgery without  bowel containing hernia. Fat and vessels extend into the inguinal canal greater on the right. 7. Degenerative changes lower thoracic and lumbar spine most prominent L4-5 level. 8. Calcified pleural plaques diaphragm region bilaterally may reflect changes of prior spaces exposure.   Electronically Signed   By: Genia Del M.D.   On: 02/02/2018 19:13  CT scan was personally reviewed today and with the patient.  This is also compared to CT abdomen pelvis with contrast from 09/23/2016 which shows the same right ureteral stone but at the level of the iliacs at that time.  Assessment & Plan:    1. Right ureteral calculus Asymptomatic nonobstructing 5 mm distal ureteral calculus The stone appears to be quite chronic and was present in the ureter at least a year and half ago as well and is made some but minimal interval progress towards the bladder Given the chronicity of the stone and concern for impending  infection versus obstruction, I recommended surgical intervention Given his multiple medical comorbidities, I feel that he would be best served with ureteroscopy to avoid the risk of arrhythmia with lithotripsy and pursue the most effective procedure in order to limit the overall number of procedures Risks and benefits of ureteroscopy were reviewed including but not limited to infection, bleeding, pain, ureteral injury which could require open surgery versus prolonged indwelling if ureteral perforation occurs, persistent stone disease, requirement for staged procedure, possible stent, and global anesthesia risks. Patient expressed understanding and desires to proceed with ureteroscopy. - Urinalysis, Complete - CULTURE, URINE COMPREHENSIVE  2. CKD (chronic kidney disease) stage 3, GFR 30-59 ml/min (HCC) Stone appears to be nonobstructive and likely not contributing to overall renal function Creatinine appears to be at baseline  He is followed by Dr. Ubaldo Glassing and will need cardiac clearance to hold his anticoagulation.  Hollice Espy, MD  [facility] [address], [place], [place] [address] [phone]

## 2018-03-19 LAB — CULTURE, URINE COMPREHENSIVE

## 2018-04-05 DIAGNOSIS — Z87442 Personal history of urinary calculi: Secondary | ICD-10-CM

## 2018-04-05 HISTORY — DX: Personal history of urinary calculi: Z87.442

## 2018-04-08 ENCOUNTER — Other Ambulatory Visit: Payer: Self-pay

## 2018-04-08 ENCOUNTER — Encounter
Admission: RE | Admit: 2018-04-08 | Discharge: 2018-04-08 | Disposition: A | Discharge status: Home / Self Care | Payer: Medicare Other | Source: Ambulatory Visit | Attending: Urology | Admitting: Urology

## 2018-04-08 DIAGNOSIS — I10 Essential (primary) hypertension: Secondary | ICD-10-CM

## 2018-04-08 DIAGNOSIS — Z01812 Encounter for preprocedural laboratory examination: Secondary | ICD-10-CM | POA: Insufficient documentation

## 2018-04-08 DIAGNOSIS — I4891 Unspecified atrial fibrillation: Secondary | ICD-10-CM | POA: Diagnosis not present

## 2018-04-08 DIAGNOSIS — Z950 Presence of cardiac pacemaker: Secondary | ICD-10-CM | POA: Insufficient documentation

## 2018-04-08 DIAGNOSIS — Z01810 Encounter for preprocedural cardiovascular examination: Secondary | ICD-10-CM | POA: Diagnosis present

## 2018-04-08 HISTORY — DX: Chronic kidney disease, unspecified: N18.9

## 2018-04-08 HISTORY — DX: Candidiasis, unspecified: B37.9

## 2018-04-08 HISTORY — DX: Psoriasis, unspecified: L40.9

## 2018-04-08 LAB — CBC WITH DIFFERENTIAL/PLATELET
BASOS ABS: 0 10*3/uL (ref 0–0.1)
Basophils Relative: 1 %
Eosinophils Absolute: 0.3 10*3/uL (ref 0–0.7)
Eosinophils Relative: 4 %
HCT: 43.8 % (ref 40.0–52.0)
Hemoglobin: 14.6 g/dL (ref 13.0–18.0)
LYMPHS ABS: 1.7 10*3/uL (ref 1.0–3.6)
Lymphocytes Relative: 25 %
MCH: 30.5 pg (ref 26.0–34.0)
MCHC: 33.3 g/dL (ref 32.0–36.0)
MCV: 91.6 fL (ref 80.0–100.0)
MONO ABS: 0.6 10*3/uL (ref 0.2–1.0)
Monocytes Relative: 9 %
NEUTROS ABS: 4.1 10*3/uL (ref 1.4–6.5)
Neutrophils Relative %: 61 %
PLATELETS: 183 10*3/uL (ref 150–440)
RBC: 4.78 MIL/uL (ref 4.40–5.90)
RDW: 14.6 % — ABNORMAL HIGH (ref 11.5–14.5)
WBC: 6.7 10*3/uL (ref 3.8–10.6)

## 2018-04-08 LAB — BASIC METABOLIC PANEL
Anion gap: 6 (ref 5–15)
BUN: 28 mg/dL — ABNORMAL HIGH (ref 8–23)
CALCIUM: 9.3 mg/dL (ref 8.9–10.3)
CO2: 27 mmol/L (ref 22–32)
CREATININE: 1.24 mg/dL (ref 0.61–1.24)
Chloride: 105 mmol/L (ref 98–111)
GFR calc Af Amer: >60 mL/min (ref >60)
GFR, EST NON AFRICAN AMERICAN: 54 mL/min — AB (ref >60)
Glucose, Bld: 118 mg/dL — ABNORMAL HIGH (ref 70–99)
Potassium: 4.7 mmol/L (ref 3.5–5.1)
SODIUM: 138 mmol/L (ref 135–145)

## 2018-04-08 LAB — PROTIME-INR
INR: 1.23
PROTHROMBIN TIME: 15.4 s — AB (ref 11.4–15.2)

## 2018-04-08 NOTE — Pre-Procedure Instructions (Signed)
FAXED REQUEST AND SPOKE WITH PATRICIA AT DR FATH'S FOR PACEMAKER DEVICE PROGRAMMING SHEET

## 2018-04-08 NOTE — Patient Instructions (Signed)
Your procedure is scheduled on: Monday, April 13, 2018  Report to [facility]    DO NOT STOP ON THE FIRST FLOOR TO REGISTER  To find out your arrival time please call [phone] between 1PM - 3PM on Friday, April 10, 2018   Remember: Instructions that are not followed completely may result in serious medical risk,  up to and including death, or upon the discretion of your surgeon and anesthesiologist your  surgery may need to be rescheduled.     _X__ 1. Do not eat food after midnight the night before your procedure.                 No gum chewing or hard candies. ABSOLUTELY NOTHING SOLID IN YOUR MOUTH                     AFTER MIDNIGHT                  You may drink clear liquids up to 2 hours before you are scheduled to arrive for your surgery-                  DO not drink clear liquids within 2 hours of the start of your surgery.                  Clear Liquids include:  water, apple juice without pulp, clear carbohydrate                 drink such as Clearfast of Gatorade, Black Coffee or Tea (Do not add                 anything to coffee or tea).  __X__2.  On the morning of surgery brush your teeth with toothpaste and water,                   You may rinse your mouth with mouthwash if you wish.                      Do not swallow any toothpaste of mouthwash.     _X__ 3.  No Alcohol for 24 hours before or after surgery.   _X__ 4.  Do Not Smoke or use e-cigarettes For 24 Hours Prior to Your Surgery.                 Do not use any chewable tobacco products for at least 6 hours prior to                 surgery.  ____  5.  Bring all medications with you on the day of surgery if instructed.   ____  6.  Notify your doctor if there is any change in your medical condition      (cold, fever, infections).     Do not wear jewelry, make-up, hairpins, clips or nail polish. Do not wear lotions, powders, or perfumes. You may wear  deodorant. Do not shave 48 hours prior to surgery. Men may shave face and neck. Do not bring valuables to the hospital.    [facility] is not responsible for any belongings or valuables.  Contacts, dentures or bridgework may not be worn into surgery. Leave your suitcase in the car. After surgery it may be brought to your room. For patients admitted to the hospital, discharge time is determined by your treatment team.   Patients discharged the  day of surgery will not be allowed to drive home.   Please read over the following fact sheets that you were given:   PREPARING FOR SURGERY   ____ Take these medicines the morning of surgery with A SIP OF WATER:    1. AMLODIPINE  2. FLECAINIDE  3. METOPROLOL  4. PRILOSEC  5.  6.  ____ Fleet Enema (as directed)   _X___ Use CHG Soap as directed  __X__ Stop Coumadin/ASPIRIN PER DR. Cornwall.  PLEASE CONTACT HIS OFFICE FOR THIS INFORMATION  __X__ Stop Anti-inflammatories AS OF TODAY.  DO NOT TAKE ALEVE AT ANY TIME                   YOU MAY TAKE TYLENOL AT ANY TIME PRIOR TO SURGERY   _X___ Stop supplements until after surgery.                  STOP FISH OIL AS OF TODAY  ____ Bring C-Pap to the hospital.   CONTINUE TAKING LOSARTAN AS SCHEDULED IN THE EVENINGS  CONTINUE TO TAKE ALL PRESCRIPTION MEDICINE AS ORDERED.  CONTACT DR. FATHS' OFFICE TO FIND OUT WHEN TO STOP COUMADIN OR ASPIRIN.  [place]

## 2018-04-08 NOTE — Pre-Procedure Instructions (Signed)
Reviewed information regarding Coumadin/Aspirin that was obtained from clearance from Dr. Ubaldo Glassing.  Patient agreed that he understood.  Also, discussed with patient that he should not be taking Aleve but should utilize Tylenol for minor aches and pains.

## 2018-04-09 LAB — URINE CULTURE: CULTURE: NO GROWTH

## 2018-04-12 MED ORDER — CEFAZOLIN SODIUM-DEXTROSE 2-4 GM/100ML-% IV SOLN
2.0000 g | INTRAVENOUS | Status: AC
  Administered 2018-04-13: 2 g via INTRAVENOUS

## 2018-04-13 ENCOUNTER — Encounter: Payer: Self-pay | Admitting: *Deleted

## 2018-04-13 ENCOUNTER — Ambulatory Visit
Admission: RE | Admit: 2018-04-13 | Discharge: 2018-04-13 | Disposition: A | Discharge status: Home / Self Care | Payer: Medicare Other | Source: Ambulatory Visit | Attending: Urology | Admitting: Urology

## 2018-04-13 ENCOUNTER — Ambulatory Visit: Payer: Medicare Other | Admitting: Anesthesiology

## 2018-04-13 ENCOUNTER — Encounter
Admission: RE | Disposition: A | Discharge status: Home / Self Care | Payer: Self-pay | Source: Ambulatory Visit | Attending: Urology

## 2018-04-13 DIAGNOSIS — M1711 Unilateral primary osteoarthritis, right knee: Secondary | ICD-10-CM | POA: Insufficient documentation

## 2018-04-13 DIAGNOSIS — G2581 Restless legs syndrome: Secondary | ICD-10-CM | POA: Diagnosis not present

## 2018-04-13 DIAGNOSIS — J449 Chronic obstructive pulmonary disease, unspecified: Secondary | ICD-10-CM | POA: Diagnosis not present

## 2018-04-13 DIAGNOSIS — N183 Chronic kidney disease, stage 3 (moderate): Secondary | ICD-10-CM | POA: Diagnosis not present

## 2018-04-13 DIAGNOSIS — Z96652 Presence of left artificial knee joint: Secondary | ICD-10-CM | POA: Insufficient documentation

## 2018-04-13 DIAGNOSIS — Z7901 Long term (current) use of anticoagulants: Secondary | ICD-10-CM | POA: Insufficient documentation

## 2018-04-13 DIAGNOSIS — B372 Candidiasis of skin and nail: Secondary | ICD-10-CM | POA: Diagnosis not present

## 2018-04-13 DIAGNOSIS — Z950 Presence of cardiac pacemaker: Secondary | ICD-10-CM | POA: Diagnosis not present

## 2018-04-13 DIAGNOSIS — Z885 Allergy status to narcotic agent status: Secondary | ICD-10-CM | POA: Insufficient documentation

## 2018-04-13 DIAGNOSIS — N3289 Other specified disorders of bladder: Secondary | ICD-10-CM | POA: Insufficient documentation

## 2018-04-13 DIAGNOSIS — I700 Atherosclerosis of aorta: Secondary | ICD-10-CM | POA: Insufficient documentation

## 2018-04-13 DIAGNOSIS — R7303 Prediabetes: Secondary | ICD-10-CM | POA: Insufficient documentation

## 2018-04-13 DIAGNOSIS — F419 Anxiety disorder, unspecified: Secondary | ICD-10-CM | POA: Diagnosis not present

## 2018-04-13 DIAGNOSIS — Z87891 Personal history of nicotine dependence: Secondary | ICD-10-CM | POA: Insufficient documentation

## 2018-04-13 DIAGNOSIS — N201 Calculus of ureter: Secondary | ICD-10-CM | POA: Insufficient documentation

## 2018-04-13 DIAGNOSIS — I129 Hypertensive chronic kidney disease with stage 1 through stage 4 chronic kidney disease, or unspecified chronic kidney disease: Secondary | ICD-10-CM | POA: Insufficient documentation

## 2018-04-13 DIAGNOSIS — K219 Gastro-esophageal reflux disease without esophagitis: Secondary | ICD-10-CM | POA: Insufficient documentation

## 2018-04-13 DIAGNOSIS — N400 Enlarged prostate without lower urinary tract symptoms: Secondary | ICD-10-CM | POA: Diagnosis not present

## 2018-04-13 DIAGNOSIS — K5730 Diverticulosis of large intestine without perforation or abscess without bleeding: Secondary | ICD-10-CM | POA: Diagnosis not present

## 2018-04-13 DIAGNOSIS — N132 Hydronephrosis with renal and ureteral calculous obstruction: Secondary | ICD-10-CM | POA: Diagnosis present

## 2018-04-13 DIAGNOSIS — I2510 Atherosclerotic heart disease of native coronary artery without angina pectoris: Secondary | ICD-10-CM | POA: Insufficient documentation

## 2018-04-13 DIAGNOSIS — Z882 Allergy status to sulfonamides status: Secondary | ICD-10-CM | POA: Diagnosis not present

## 2018-04-13 DIAGNOSIS — Z79899 Other long term (current) drug therapy: Secondary | ICD-10-CM | POA: Insufficient documentation

## 2018-04-13 HISTORY — PX: CYSTOSCOPY/URETEROSCOPY/HOLMIUM LASER/STENT PLACEMENT: SHX6546

## 2018-04-13 LAB — PROTIME-INR
INR: 1.03
Prothrombin Time: 13.4 seconds (ref 11.4–15.2)

## 2018-04-13 LAB — GLUCOSE, CAPILLARY: GLUCOSE-CAPILLARY: 108 mg/dL — AB (ref 70–99)

## 2018-04-13 SURGERY — CYSTOSCOPY/URETEROSCOPY/HOLMIUM LASER/STENT PLACEMENT
Anesthesia: General | Laterality: Right

## 2018-04-13 MED ORDER — LIDOCAINE HCL (CARDIAC) PF 100 MG/5ML IV SOSY
PREFILLED_SYRINGE | INTRAVENOUS | Status: DC | PRN
  Administered 2018-04-13: 100 mg via INTRAVENOUS

## 2018-04-13 MED ORDER — PHENYLEPHRINE HCL 10 MG/ML IJ SOLN
INTRAMUSCULAR | Status: DC | PRN
  Administered 2018-04-13 (×3): 100 ug via INTRAVENOUS

## 2018-04-13 MED ORDER — NYSTATIN 100000 UNIT/GM EX POWD: Freq: Four times a day (QID) | CUTANEOUS | 0 refills | Status: DC

## 2018-04-13 MED ORDER — FENTANYL CITRATE (PF) 100 MCG/2ML IJ SOLN
INTRAMUSCULAR | Status: AC
  Filled 2018-04-13: qty 2

## 2018-04-13 MED ORDER — SODIUM CHLORIDE 0.9 % IV SOLN
INTRAVENOUS | Status: DC
  Administered 2018-04-13: 11:00:00 via INTRAVENOUS

## 2018-04-13 MED ORDER — PROMETHAZINE HCL 25 MG/ML IJ SOLN: 6.2500 mg | INTRAMUSCULAR | Status: DC | PRN

## 2018-04-13 MED ORDER — OXYCODONE-ACETAMINOPHEN 5-325 MG PO TABS: 1.0000 | ORAL_TABLET | ORAL | 0 refills | Status: DC | PRN

## 2018-04-13 MED ORDER — DEXAMETHASONE SODIUM PHOSPHATE 10 MG/ML IJ SOLN
INTRAMUSCULAR | Status: DC | PRN
  Administered 2018-04-13: 10 mg via INTRAVENOUS

## 2018-04-13 MED ORDER — IOTHALAMATE MEGLUMINE 43 % IV SOLN
INTRAVENOUS | Status: DC | PRN
  Administered 2018-04-13: 10 mL via URETHRAL

## 2018-04-13 MED ORDER — DOCUSATE SODIUM 100 MG PO CAPS: 100.0000 mg | ORAL_CAPSULE | Freq: Two times a day (BID) | ORAL | 0 refills | Status: DC

## 2018-04-13 MED ORDER — PROPOFOL 10 MG/ML IV BOLUS
INTRAVENOUS | Status: DC | PRN
  Administered 2018-04-13: 150 mg via INTRAVENOUS

## 2018-04-13 MED ORDER — ONDANSETRON HCL 4 MG/2ML IJ SOLN
INTRAMUSCULAR | Status: DC | PRN
  Administered 2018-04-13: 4 mg via INTRAVENOUS

## 2018-04-13 MED ORDER — ACETAMINOPHEN 160 MG/5ML PO SOLN
325.0000 mg | ORAL | Status: DC | PRN
  Filled 2018-04-13: qty 20.3

## 2018-04-13 MED ORDER — FENTANYL CITRATE (PF) 100 MCG/2ML IJ SOLN: 25.0000 ug | INTRAMUSCULAR | Status: DC | PRN

## 2018-04-13 MED ORDER — OXYBUTYNIN CHLORIDE 5 MG PO TABS: 5.0000 mg | ORAL_TABLET | Freq: Three times a day (TID) | ORAL | 0 refills | Status: DC | PRN

## 2018-04-13 MED ORDER — CEFAZOLIN SODIUM-DEXTROSE 2-4 GM/100ML-% IV SOLN
INTRAVENOUS | Status: AC
  Filled 2018-04-13: qty 100

## 2018-04-13 MED ORDER — ACETAMINOPHEN 325 MG PO TABS: 325.0000 mg | ORAL_TABLET | ORAL | Status: DC | PRN

## 2018-04-13 MED ORDER — FENTANYL CITRATE (PF) 100 MCG/2ML IJ SOLN
INTRAMUSCULAR | Status: DC | PRN
  Administered 2018-04-13: 25 ug via INTRAVENOUS

## 2018-04-13 MED ORDER — TAMSULOSIN HCL 0.4 MG PO CAPS: 0.4000 mg | ORAL_CAPSULE | Freq: Every day | ORAL | 0 refills | Status: DC

## 2018-04-13 MED ORDER — MEPERIDINE HCL 50 MG/ML IJ SOLN: 6.2500 mg | INTRAMUSCULAR | Status: DC | PRN

## 2018-04-13 SURGICAL SUPPLY — 32 items
ADHESIVE MASTISOL STRL (MISCELLANEOUS) ×2 IMPLANT
BAG DRAIN CYSTO-URO LG1000N (MISCELLANEOUS) ×3 IMPLANT
BASKET ZERO TIP 1.9FR (BASKET) ×2 IMPLANT
BRUSH SCRUB EZ 1% IODOPHOR (MISCELLANEOUS) ×3 IMPLANT
CATH URETL 5X70 OPEN END (CATHETERS) ×3 IMPLANT
CLOSURE WOUND 1/2 X4 (GAUZE/BANDAGES/DRESSINGS) ×1
CNTNR SPEC 2.5X3XGRAD LEK (MISCELLANEOUS)
CONRAY 43 FOR UROLOGY 50M (MISCELLANEOUS) ×3 IMPLANT
CONT SPEC 4OZ STER OR WHT (MISCELLANEOUS)
CONTAINER SPEC 2.5X3XGRAD LEK (MISCELLANEOUS) IMPLANT
DRAPE UTILITY 15X26 TOWEL STRL (DRAPES) ×3 IMPLANT
FIBER LASER LITHO 273 (Laser) ×2 IMPLANT
GLOVE BIO SURGEON STRL SZ 6.5 (GLOVE) ×2 IMPLANT
GLOVE BIO SURGEONS STRL SZ 6.5 (GLOVE) ×1
GOWN STRL REUS W/ TWL LRG LVL3 (GOWN DISPOSABLE) ×2 IMPLANT
GOWN STRL REUS W/TWL LRG LVL3 (GOWN DISPOSABLE) ×4
GUIDEWIRE GREEN .038 145CM (MISCELLANEOUS) IMPLANT
INFUSOR MANOMETER BAG 3000ML (MISCELLANEOUS) ×3 IMPLANT
INTRODUCER DILATOR DOUBLE (INTRODUCER) IMPLANT
KIT TURNOVER CYSTO (KITS) ×3 IMPLANT
PACK CYSTO AR (MISCELLANEOUS) ×3 IMPLANT
SENSORWIRE 0.038 NOT ANGLED (WIRE) ×3
SET CYSTO W/LG BORE CLAMP LF (SET/KITS/TRAYS/PACK) ×3 IMPLANT
SHEATH URETERAL 12FRX35CM (MISCELLANEOUS) IMPLANT
SOL .9 NS 3000ML IRR  AL (IV SOLUTION) ×2
SOL .9 NS 3000ML IRR UROMATIC (IV SOLUTION) ×1 IMPLANT
STENT URET 6FRX24 CONTOUR (STENTS) IMPLANT
STENT URET 6FRX26 CONTOUR (STENTS) ×2 IMPLANT
STRIP CLOSURE SKIN 1/2X4 (GAUZE/BANDAGES/DRESSINGS) ×1 IMPLANT
SURGILUBE 2OZ TUBE FLIPTOP (MISCELLANEOUS) ×3 IMPLANT
WATER STERILE IRR 1000ML POUR (IV SOLUTION) ×3 IMPLANT
WIRE SENSOR 0.038 NOT ANGLED (WIRE) ×1 IMPLANT

## 2018-04-13 NOTE — Anesthesia Procedure Notes (Signed)
Procedure Name: LMA Insertion Date/Time: 04/13/2018 11:34 AM Performed by: Aline Brochure, CRNA Pre-anesthesia Checklist: Patient identified, Emergency Drugs available, Suction available and Patient being monitored Patient Re-evaluated:Patient Re-evaluated prior to induction Oxygen Delivery Method: Circle system utilized Preoxygenation: Pre-oxygenation with 100% oxygen Induction Type: IV induction Ventilation: Mask ventilation without difficulty LMA: LMA inserted LMA Size: 4.0 Number of attempts: 1 Placement Confirmation: positive ETCO2 and breath sounds checked- equal and bilateral Tube secured with: Tape Dental Injury: Teeth and Oropharynx as per pre-operative assessment

## 2018-04-13 NOTE — Discharge Instructions (Signed)
AMBULATORY SURGERY  DISCHARGE INSTRUCTIONS   1) The drugs that you were given will stay in your system until tomorrow so for the next 24 hours you should not:  A) Drive an automobile B) Make any legal decisions C) Drink any alcoholic beverage   2) You may resume regular meals tomorrow.  Today it is better to start with liquids and gradually work up to solid foods.  You may eat anything you prefer, but it is better to start with liquids, then soup and crackers, and gradually work up to solid foods.   3) Please notify your doctor immediately if you have any unusual bleeding, trouble breathing, redness and pain at the surgery site, drainage, fever, or pain not relieved by medication.    4) Additional Instructions:        Please contact your physician with any problems or Same Day Surgery at [phone], Monday through Friday 6 am to 4 pm, or [place] at [facility] number at [phone].   You have a ureteral stent in place.  This is a tube that extends from your kidney to your bladder.  This may cause urinary bleeding, burning with urination, and urinary frequency.  Please call our office or present to the ED if you develop fevers >101 or pain which is not able to be controlled with oral pain medications.  You may be given either Flomax and/ or ditropan to help with bladder spasms and stent pain in addition to pain medications.    Your stent is on a string attached to the head of your penis.  In 1 week, taped and pull gently until the entire stent is removed.  If you have any difficulties or anxiety about removing this on your own, please call our office to arrange for a nurse visit.  If the stent is accidentally dislodged prematurely, please call our office during business hours.  This is not an emergency.  [place] [address] [place],  [address] [phone]

## 2018-04-13 NOTE — Transfer of Care (Signed)
Immediate Anesthesia Transfer of Care Note  Patient: Joseph Houston  Procedure(s) Performed: CYSTOSCOPY/URETEROSCOPY/HOLMIUM LASER/STENT PLACEMENT (Right )  Patient Location: PACU  Anesthesia Type:General  Level of Consciousness: sedated  Airway & Oxygen Therapy: Patient connected to face mask oxygen  Post-op Assessment: Post -op Vital signs reviewed and stable  Post vital signs: stable  Last Vitals:  Vitals Value Taken Time  BP 109/72 04/13/2018 12:20 PM  Temp 36.4 C 04/13/2018 12:19 PM  Pulse 60 04/13/2018 12:20 PM  Resp 11 04/13/2018 12:20 PM  SpO2 97 % 04/13/2018 12:20 PM  Vitals shown include unvalidated device data.  Last Pain:  Vitals:   04/13/18 1045  TempSrc:   PainSc: 0-No pain         Complications: No apparent anesthesia complications

## 2018-04-13 NOTE — Anesthesia Post-op Follow-up Note (Signed)
Anesthesia QCDR form completed.        

## 2018-04-13 NOTE — Op Note (Addendum)
Date of procedure: 04/13/18  Preoperative diagnosis:  1. Right distal ureteral stone   Postoperative diagnosis:  1. Right distal ureteral stone   Procedure: 1. Right ureteroscopy 2. Laser lithotripsy 3. Right ureteral stent placement 4. Basket extraction of stone fragment 5. Right retrograde pyelogram  Surgeon: Hollice Espy, MD  Anesthesia: General  Complications: None  Intraoperative findings: Irregular prostatic fossa.  Bladder is otherwise unremarkable.  Trigone somewhat close to the bladder neck.  5 mm stone identified in the right distal ureter, treated and all fragments were removed.  EBL: Minimal  Specimens: None  Drains: 6 x 26 French double-J ureteral stent on right, string left in place  Indication: Joseph Houston is a 82 y.o. patient with a chronic nonobstructing 5 mm right distal ureteral calculus.  After reviewing the management options for treatment, he elected to proceed with the above surgical procedure(s). We have discussed the potential benefits and risks of the procedure, side effects of the proposed treatment, the likelihood of the patient achieving the goals of the procedure, and any potential problems that might occur during the procedure or recuperation. Informed consent has been obtained.  Description of procedure:  The patient was taken to the operating room and general anesthesia was induced.  The patient was placed in the dorsal lithotomy position, prepped and draped in the usual sterile fashion, and preoperative antibiotics were administered. A preoperative time-out was performed.   A 21 French cystoscope was advanced per urethra into the bladder.  Notably, the prostatic fossa was regular with a previous TURP defect appreciated.  The bladder itself is mildly trabeculated but otherwise unremarkable.  There were no other lesions, ulcerations, or stones within the bladder.  Attention was turned to the right ureteral orifice.  Of note, the trigone was  somewhat distorted and fairly close to the bladder neck likely a result of previous TURP.  The right UO was cannulated using a 5 [place] open-ended ureteral catheter as well as a sensor wire up to level the kidney.  The stone could be seen within the distal ureter on scout imaging.  The wire was snapped in place as a safety wire.  A 4.5 semirigid ureteroscope was then advanced to the distal ureter to the level of the stone.  A 273 m laser fiber was then brought in and using settings of 0.8 J and 10 Hz, the stone was fragmented until approximately 8 or 9 smaller pieces.  Each of these pieces was then extracted using 1.9 [place] tipless nitinol basket.  The scope was then able to be advanced all the way up to the level of the proximal/mid ureter without any significant residual stone material.  Contrast was injected which showed a decompressed upper tract collecting system without hydronephrosis or filling defects.  The scope was then backed down the length the ureter inspecting along the way.  There was a small mucosal rent which was minimal but no contrast extravasation appreciated and no other significant trauma or edema within the ureter.  There was some narrowing around the UO itself.  The scope was then removed.  A 6 x 26 French double-J ureteral stent was then advanced over the safety wire cystoscopically up to level the renal pelvis.  The wires partially drawn until full course over the renal pelvis.  The wires and fully withdrawn a focal is noted within the bladder.  The stent string was left attached to the distal coil of the stent and affixed to the patient's glans using Mastisol and  Tegaderm.  He was then clean and dry, repositioned supine position, reversed anesthesia, taken to the PACU in stable condition.  Of note, the patient did have a fairly significant candidal rash involving his inguinal folds, scrotum and perineum.  Plan: Patient will remove his own stent next week.  He will follow-up in 4 weeks  with renal ultrasound prior.  We will treat his yeast infection with nystatin powder.  Hollice Espy, M.D.

## 2018-04-13 NOTE — Anesthesia Preprocedure Evaluation (Addendum)
Anesthesia Evaluation  Patient identified by MRN, date of birth, ID band Patient awake    Reviewed: Allergy & Precautions, H&P , NPO status , reviewed documented beta blocker date and time   History of Anesthesia Complications (+) DIFFICULT AIRWAY  Airway Mallampati: III  TM Distance: >3 FB Neck ROM: full    Dental  (+) Upper Dentures, Lower Dentures   Pulmonary shortness of breath, COPD, former smoker,    Pulmonary exam normal        Cardiovascular hypertension, + CAD  Normal cardiovascular exam+ dysrhythmias + pacemaker      Neuro/Psych  Headaches, Anxiety    GI/Hepatic hiatal hernia, GERD  Medicated and Controlled,  Endo/Other    Renal/GU Renal disease     Musculoskeletal  (+) Arthritis ,   Abdominal   Peds  Hematology  (+) anemia ,   Anesthesia Other Findings Past Medical History: No date: Anemia     Comment:  was treated in the past, not now No date: Anxiety No date: Arthritis     Comment:  knees No date: BPH (benign prostatic hypertrophy) No date: Chronic kidney disease     Comment:  stage 3 ckd No date: COPD (chronic obstructive pulmonary disease) (HCC)     Comment:  has had most of his life. has not used an inhaler for               years No date: Coronary artery disease No date: Difficult intubation     Comment:  pt reports bad sore throat after surgery. pt unsure of               this response No date: Dyspnea 2016: Dysrhythmia     Comment:  atrial fibrillation, bradycardia No date: GERD (gastroesophageal reflux disease) No date: Headache No date: History of hiatal hernia 04/2018: History of kidney stones No date: Hypertension No date: Pre-diabetes     Comment:  dr. Ginette Pitman following 2017: Presence of permanent cardiac pacemaker 2019: Psoriasis     Comment:  elbows No date: Restless leg 1992: Vertigo No date: Yeast infection     Comment:  in groin for 50 years  Past Surgical  History: 09/25/2016: CATARACT EXTRACTION W/PHACO; Right     Comment:  Procedure: CATARACT EXTRACTION PHACO AND INTRAOCULAR               LENS PLACEMENT ([place]);  Surgeon: Estill Cotta, MD;                Location: [facility];  Service: Ophthalmology;  Laterality:              Right;  [place] 01:58 AP% 18.1 CDE 43.36 Fluid pack #               [id number] 12/22/2014: ELECTROPHYSIOLOGIC STUDY; N/A     Comment:  Procedure: CARDIOVERSION;  Surgeon: Teodoro Spray, MD;               Location: [facility];  Service: Cardiovascular;                Laterality: N/A; 1950: FINGER SURGERY; Right     Comment:  index finger cut off half way No date: HERNIA REPAIR; Bilateral     Comment:  inguinal repaired x 4 No date: INSERT / REPLACE / REMOVE PACEMAKER     Comment:  12/17 2012: JOINT REPLACEMENT; Right     Comment:  partial knee replacement 07/17/2016: PACEMAKER INSERTION; Left     Comment:  Procedure: INSERTION PACEMAKER;  Surgeon: Isaias Cowman, MD;  Location: [facility];  Service:               Cardiovascular;  Laterality: Left; 1950: TONSILLECTOMY 09/30/2017: TOTAL KNEE ARTHROPLASTY; Left     Comment:  Procedure: TOTAL KNEE ARTHROPLASTY;  Surgeon: Hessie Knows, MD;  Location: [facility];  Service: Orthopedics;               Laterality: Left; No date: TRANSURETHRAL RESECTION OF PROSTATE     Reproductive/Obstetrics                           Anesthesia Physical Anesthesia Plan  ASA: III  Anesthesia Plan: General, General LMA and General ETT   Post-op Pain Management:    Induction: Intravenous  PONV Risk Score and Plan: 2 and Treatment may vary due to age or medical condition and Ondansetron  Airway Management Planned: Nasal Cannula and Natural Airway  Additional Equipment:   Intra-op Plan:   Post-operative Plan:   Informed Consent: I have reviewed the patients History and Physical, chart, labs and discussed the procedure including  the risks, benefits and alternatives for the proposed anesthesia with the patient or authorized representative who has indicated his/her understanding and acceptance.   Dental Advisory Given  Plan Discussed with: CRNA  Anesthesia Plan Comments:         Anesthesia Quick Evaluation

## 2018-04-13 NOTE — Interval H&P Note (Signed)
History and Physical Interval Note:  04/13/2018 11:09 AM  Joseph Houston  has presented today for surgery, with the diagnosis of right ureteral stone  The various methods of treatment have been discussed with the patient and family. After consideration of risks, benefits and other options for treatment, the patient has consented to  Procedure(s): CYSTOSCOPY/URETEROSCOPY/HOLMIUM LASER/STENT PLACEMENT (Right) as a surgical intervention .  The patient's history has been reviewed, patient examined, no change in status, stable for surgery.  I have reviewed the patient's chart and labs.  Questions were answered to the patient's satisfaction.    RRR CTAB  Hollice Espy

## 2018-04-14 ENCOUNTER — Encounter: Payer: Self-pay | Admitting: Urology

## 2018-04-14 ENCOUNTER — Telehealth: Payer: Self-pay | Admitting: Urology

## 2018-04-14 NOTE — Anesthesia Postprocedure Evaluation (Signed)
Anesthesia Post Note  Patient: Joseph Houston  Procedure(s) Performed: CYSTOSCOPY/URETEROSCOPY/HOLMIUM LASER/STENT PLACEMENT (Right )  Patient location during evaluation: PACU Anesthesia Type: General Level of consciousness: awake and alert Pain management: pain level controlled Vital Signs Assessment: post-procedure vital signs reviewed and stable Respiratory status: spontaneous breathing, nonlabored ventilation and respiratory function stable Cardiovascular status: blood pressure returned to baseline and stable Postop Assessment: no apparent nausea or vomiting Anesthetic complications: no     Last Vitals:  Vitals:   04/13/18 1304 04/13/18 1330  BP: (!) 137/91 (!) 144/83  Pulse: 61 64  Resp: 17 16  Temp: (!) 36.1 C (!) 36.3 C  SpO2: 96% 100%    Last Pain:  Vitals:   04/13/18 1330  TempSrc: Temporal  PainSc: 0-No pain                 Alphonsus Sias

## 2018-04-14 NOTE — Telephone Encounter (Signed)
Follow up appt made message sent to scheduling not to schedule until prior to that.   Joseph Houston

## 2018-04-17 ENCOUNTER — Encounter: Payer: Self-pay | Admitting: Urology

## 2018-05-08 ENCOUNTER — Ambulatory Visit
Admission: RE | Admit: 2018-05-08 | Discharge: 2018-05-08 | Disposition: A | Discharge status: Home / Self Care | Payer: Medicare Other | Source: Ambulatory Visit | Attending: Urology | Admitting: Urology

## 2018-05-08 DIAGNOSIS — Z9889 Other specified postprocedural states: Secondary | ICD-10-CM | POA: Insufficient documentation

## 2018-05-08 DIAGNOSIS — N201 Calculus of ureter: Secondary | ICD-10-CM | POA: Diagnosis not present

## 2018-05-12 ENCOUNTER — Ambulatory Visit (INDEPENDENT_AMBULATORY_CARE_PROVIDER_SITE_OTHER): Payer: Medicare Other | Admitting: Urology

## 2018-05-12 ENCOUNTER — Encounter: Payer: Self-pay | Admitting: Urology

## 2018-05-12 VITALS — BP 140/84 | HR 68 | Ht 71.0 in | Wt 235.4 lb

## 2018-05-12 DIAGNOSIS — N201 Calculus of ureter: Secondary | ICD-10-CM | POA: Diagnosis not present

## 2018-05-12 DIAGNOSIS — N183 Chronic kidney disease, stage 3 unspecified: Secondary | ICD-10-CM

## 2018-05-12 NOTE — Progress Notes (Signed)
05/12/2018 9:24 AM   Joseph Houston 11-30-1935 [id number]  Referring provider: Tracie Harrier, MD [address] [facility] [place], [place] [address]  Chief Complaint  Patient presents with  . Follow-up    HPI: Patient is a 82 year old Caucasian male with a history of nephrolithiasis presents today for RUS report.  Background history 82 year old male who presents today for further evaluation of right nonobstructing ureteral calculus.  He was seen and evaluated by his primary care physician in June with lower abdominal pain.  He reports that this is chronic and persistent over the last year.  As part of his further evaluation for this, he is put on Cipro Flagyl and underwent noncontrast CT scan for further evaluation.  This revealed an incidental 5 mm right distal ureteral calculus which appears to be nonobstructing without evidence of hydronephrosis.  In retrospect, and review of CT scan from 09/2016, it is appear that the stone was within the ureter at that point in time, the level of the iliacs and has progressed somewhat since then.  It was also present on a CT scan 3 years ago but within the kidney which he was aware.  He denies flank pain, gross hematuria, or any other symptoms of the stone.  He does have personal history of stones in the past past several spontaneously but never required surgical intervention.  He was previously followed by Dr. Bernardo Heater.  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  He is also had a TURP procedure.  He does have a personal history of arrhythmia status post pacemaker.  He is on chronic Coumadin.  He is followed by Dr. Ubaldo Glassing.  He underwent right URS/LL/ureteral stent placement on with subsequent self stent removal on 04/13/2018 with Dr. Erlene Quan.  Procedure was as expected and uneventful.    RUS on 05/08/2018 revealed no hydronephrosis.    Today, he is complaining of post void dribbling.   Patient denies any gross hematuria, dysuria or suprapubic/flank pain.  Patient denies any fevers, chills, nausea or vomiting.    PMH: Past Medical History:  Diagnosis Date  . Anemia    was treated in the past, not now  . Anxiety   . Arthritis    knees  . BPH (benign prostatic hypertrophy)   . Chronic kidney disease    stage 3 ckd  . COPD (chronic obstructive pulmonary disease) ([place])    has had most of his life. has not used an inhaler for years  . Coronary artery disease   . Difficult intubation    pt reports bad sore throat after surgery. pt unsure of this response  . Dyspnea   . Dysrhythmia 2016   atrial fibrillation, bradycardia  . GERD (gastroesophageal reflux disease)   . Headache   . History of hiatal hernia   . History of kidney stones 04/2018  . Hypertension   . Pre-diabetes    dr. Ginette Pitman following  . Presence of permanent cardiac pacemaker 2017  . Psoriasis 2019   elbows  . Restless leg   . Vertigo 1992  . Yeast infection    in groin for 50 years    Surgical History: Past Surgical History:  Procedure Laterality Date  . CATARACT EXTRACTION W/PHACO Right 09/25/2016   Procedure: CATARACT EXTRACTION PHACO AND INTRAOCULAR LENS PLACEMENT ([facility]);  Surgeon: Estill Cotta, MD;  Location: [facility];  Service: Ophthalmology;  Laterality: Right;  [place] 01:58 AP% 18.1 CDE 43.36 Fluid pack #  [id number]  . CYSTOSCOPY/URETEROSCOPY/HOLMIUM LASER/STENT PLACEMENT Right 04/13/2018   Procedure: CYSTOSCOPY/URETEROSCOPY/HOLMIUM LASER/STENT PLACEMENT;  Surgeon: Hollice Espy, MD;  Location: [facility];  Service: Urology;  Laterality: Right;  . ELECTROPHYSIOLOGIC STUDY N/A 12/22/2014   Procedure: CARDIOVERSION;  Surgeon: Teodoro Spray, MD;  Location: [facility];  Service: Cardiovascular;  Laterality: N/A;  . FINGER SURGERY Right 1950   index finger cut off half way  . HERNIA REPAIR Bilateral    inguinal repaired x 4  . INSERT / REPLACE / REMOVE PACEMAKER     12/17  . JOINT REPLACEMENT  Right 2012   partial knee replacement  . PACEMAKER INSERTION Left 07/17/2016   Procedure: INSERTION PACEMAKER;  Surgeon: Isaias Cowman, MD;  Location: [facility];  Service: Cardiovascular;  Laterality: Left;  . TONSILLECTOMY  1950  . TOTAL KNEE ARTHROPLASTY Left 09/30/2017   Procedure: TOTAL KNEE ARTHROPLASTY;  Surgeon: Hessie Knows, MD;  Location: [facility];  Service: Orthopedics;  Laterality: Left;  . TRANSURETHRAL RESECTION OF PROSTATE      Home Medications:  Allergies as of 05/12/2018      Reactions   Mold Extract [trichophyton] Swelling, Other (See Comments)   Tightness in throat   Dog Epithelium Swelling, Other (See Comments)   Also, cats too He is near the animals and does okay but allergy testing indicates that he   Is allergic to animals   Tree Extract Swelling   Allergic to a variety of trees that surround his home. Allergy testing indicated that  this is an allergy.  Took allergy shots for many years. Stopped 1 yr ago   Codeine Nausea And Vomiting   Sulfa Antibiotics Nausea And Vomiting      Medication List        Accurate as of 05/12/18  9:24 AM. Always use your most recent med list.          acetaminophen 650 MG CR tablet Commonly known as:  TYLENOL Take 1,300 mg by mouth every 8 (eight) hours as needed for pain.   amLODipine 5 MG tablet Commonly known as:  NORVASC Take 1 tablet (5 mg total) by mouth daily.   aspirin EC 81 MG tablet Take 81 mg by mouth daily.   FISH OIL PO Take 1 capsule by mouth 2 (two) times daily.   flecainide 100 MG tablet Commonly known as:  TAMBOCOR Take 100 mg by mouth 2 (two) times daily.   fluticasone 50 MCG/ACT nasal spray Commonly known as:  FLONASE Place 2 sprays into both nostrils daily as needed for allergies.   losartan 100 MG tablet Commonly known as:  COZAAR Take 100 mg by mouth at bedtime.   methocarbamol 500 MG tablet Commonly known as:  ROBAXIN Take 1 tablet (500 mg total) by mouth 3 (three) times  daily.   metoprolol succinate 25 MG 24 hr tablet Commonly known as:  TOPROL-XL   metoprolol tartrate 25 MG tablet Commonly known as:  LOPRESSOR Take 25 mg by mouth 2 (two) times daily.   nystatin powder Commonly known as:  MYCOSTATIN/NYSTOP Apply topically 4 (four) times daily.   omeprazole 20 MG capsule Commonly known as:  PRILOSEC 2 (two) times daily before a meal.   oxybutynin 5 MG tablet Commonly known as:  DITROPAN Take 1 tablet (5 mg total) by mouth every 8 (eight) hours as needed for bladder spasms.   oxyCODONE-acetaminophen 5-325 MG tablet Commonly known as:  PERCOCET/ROXICET Take 1-2 tablets by mouth every 4 (four) hours as needed for moderate pain or  severe pain.   tamsulosin 0.4 MG Caps capsule Commonly known as:  FLOMAX Take 1 capsule (0.4 mg total) by mouth daily.   traMADol 50 MG tablet Commonly known as:  ULTRAM Take by mouth every 6 (six) hours as needed for moderate pain.   warfarin 5 MG tablet Commonly known as:  COUMADIN Take 5 mg by mouth daily.       Allergies:  Allergies  Allergen Reactions  . Mold Extract [Trichophyton] Swelling and Other (See Comments)    Tightness in throat  . Dog Epithelium Swelling and Other (See Comments)    Also, cats too He is near the animals and does okay but allergy testing indicates that he   Is allergic to animals    . Tree Extract Swelling    Allergic to a variety of trees that surround his home. Allergy testing indicated that  this is an allergy.  Took allergy shots for many years. Stopped 1 yr ago  . Codeine Nausea And Vomiting  . Sulfa Antibiotics Nausea And Vomiting    Family History: No family history on file.  Social History:  reports that he quit smoking about 52 years ago. His smoking use included cigarettes. He smoked 2.00 packs per day. He has never used smokeless tobacco. He reports that he does not drink alcohol or use drugs.  ROS: UROLOGY Frequent Urination?: No Hard to postpone  urination?: No Burning/pain with urination?: No Get up at night to urinate?: No Leakage of urine?: Yes Urine stream starts and stops?: No Trouble starting stream?: No Do you have to strain to urinate?: No Blood in urine?: No Urinary tract infection?: No Sexually transmitted disease?: No Injury to kidneys or bladder?: No Painful intercourse?: No Weak stream?: No Erection problems?: No Penile pain?: No  Gastrointestinal Nausea?: No Vomiting?: No Indigestion/heartburn?: No Diarrhea?: No Constipation?: No  Constitutional Fever: No Night sweats?: No Weight loss?: No Fatigue?: No  Skin Skin rash/lesions?: Yes Itching?: No  Eyes Blurred vision?: No Double vision?: No  Ears/Nose/Throat Sore throat?: No Sinus problems?: Yes  Hematologic/Lymphatic Swollen glands?: No Easy bruising?: Yes  Cardiovascular Leg swelling?: No Chest pain?: No  Respiratory Cough?: No Shortness of breath?: Yes  Endocrine Excessive thirst?: No  Musculoskeletal Back pain?: No Joint pain?: Yes  Neurological Headaches?: No Dizziness?: No  Psychologic Depression?: No Anxiety?: No  Physical Exam: BP 140/84 (BP Location: Left Arm, Patient Position: Sitting, Cuff Size: Normal)   Pulse 68   Ht 5\' 11"  (1.803 m)   Wt 235 lb 6.4 oz (106.8 kg)   BMI 32.83 kg/m   Constitutional: Well nourished. Alert and oriented, No acute distress. HEENT: [place] AT, moist mucus membranes. Trachea midline, no masses. Cardiovascular: No clubbing, cyanosis, or edema. Respiratory: Normal respiratory effort, no increased work of breathing. Skin: No rashes, bruises or suspicious lesions. Lymph: No cervical or inguinal adenopathy. Neurologic: Grossly intact, no focal deficits, moving all 4 extremities. Psychiatric: Normal mood and affect.  Laboratory Data: Creatinine 1.24 on 04/2018, appears to be his baseline PSA 1.21 on 01/2018 I have reviewed the labs.    Pertinent Imaging: CLINICAL DATA:  Right  ureteral calculus status post right ureteroscopy.  EXAM: RENAL / URINARY TRACT ULTRASOUND COMPLETE  COMPARISON:  Body CT 02/02/2018  FINDINGS: Right Kidney:  Length: 9.7 cm. Echogenicity within normal limits. No mass or hydronephrosis visualized. 1.8 x 2.1 x 2.3 cm benign-appearing cyst off of the mid to lower right renal cortex.  Left Kidney:  Length: 10.8 cm. Echogenicity within normal  limits. No mass or hydronephrosis visualized. Benign-appearing cyst off superior pole of the left kidney measures 3.8 x 3.3 x 3.5 cm.  Bladder:  Appears normal for degree of bladder distention. Bilateral ureteral jets are seen.  IMPRESSION: No evidence of nephrolithiasis or hydronephrosis.  Normal appearance of the urinary bladder. Bilateral ureteral jets seen.   Electronically Signed   By: Fidela Salisbury M.D.   On: 05/08/2018 09:30 I have independently reviewed the films  Assessment & Plan:    1. Right ureteral calculus S/p right URS No hydro seen on RUS   2. CKD (chronic kidney disease) stage 3, GFR 30-59 ml/min (HCC) Stone appears to be nonobstructive and likely not contributing to overall renal function Creatinine appears to be at [place], [place] [address], [place], [place] [address] [phone]

## 2018-10-21 ENCOUNTER — Encounter: Payer: Self-pay | Admitting: Urology

## 2018-10-21 ENCOUNTER — Ambulatory Visit: Payer: Medicare Other | Admitting: Urology

## 2018-10-21 ENCOUNTER — Other Ambulatory Visit: Payer: Self-pay

## 2018-10-21 VITALS — BP 129/74 | HR 71 | Ht 71.0 in | Wt 239.0 lb

## 2018-10-21 DIAGNOSIS — N5201 Erectile dysfunction due to arterial insufficiency: Secondary | ICD-10-CM

## 2018-10-21 NOTE — Progress Notes (Signed)
10/21/2018 8:41 AM   Joseph Houston 08/29/1935 [id number]  Referring provider: Tracie Harrier, MD [address] [facility] [place], [place] [address]  Chief Complaint  Patient presents with  . Follow-up    HPI: 83 year old male previously followed for stone disease and BPH presents today for evaluation of erectile dysfunction.  He complains of difficulty achieving and maintaining an erection.  He has partial erections which are not firm enough for penetration.  He was given a trial of sildenafil by Dr. Ubaldo Glassing which he tried at 100 mg for 3-4 attempts and this medication was not effective.  Organic risk factors include hypertension, antihypertensive medications including beta-blockers and significant tobacco history although he quit approximately 50 years ago.  He denies bothersome lower urinary tract symptoms.  Denies pain or curvature with erections.   PMH: Past Medical History:  Diagnosis Date  . Anemia    was treated in the past, not now  . Anxiety   . Arthritis    knees  . BPH (benign prostatic hypertrophy)   . Chronic kidney disease    stage 3 ckd  . COPD (chronic obstructive pulmonary disease) ([place])    has had most of his life. has not used an inhaler for years  . Coronary artery disease   . Difficult intubation    pt reports bad sore throat after surgery. pt unsure of this response  . Dyspnea   . Dysrhythmia 2016   atrial fibrillation, bradycardia  . GERD (gastroesophageal reflux disease)   . Headache   . History of hiatal hernia   . History of kidney stones 04/2018  . Hypertension   . Pre-diabetes    dr. Ginette Pitman following  . Presence of permanent cardiac pacemaker 2017  . Psoriasis 2019   elbows  . Restless leg   . Vertigo 1992  . Yeast infection    in groin for 50 years    Surgical History: Past Surgical History:  Procedure Laterality Date  . CATARACT EXTRACTION W/PHACO Right 09/25/2016   Procedure: CATARACT EXTRACTION PHACO AND  INTRAOCULAR LENS PLACEMENT (IOC);  Surgeon: Estill Cotta, MD;  Location: [facility] OR[facility];  Service: Ophthalmology;  Laterality: Right;  [place] 01:58 AP% 18.1 CDE 43.36 Fluid pack # [id number]  . CYSTOSCOPY/URETEROSCOPY/HOLMIUM LASER/STENT PLACEMENT Right 04/13/2018   Procedure: CYSTOSCOPY/URETEROSCOPY/HOLMIUM LASER/STENT PLACEMENT;  Surgeon: Hollice Espy, MD;  Location: [facility];  Service: Urology;  Laterality: Right;  . ELECTROPHYSIOLOGIC STUDY N/A 12/22/2014   Procedure: CARDIOVERSION;  Surgeon: Teodoro Spray, MD;  Location: [facility];  Service: Cardiovascular;  Laterality: N/A;  . FINGER SURGERY Right 1950   index finger cut off half way  . HERNIA REPAIR Bilateral    inguinal repaired x 4  . INSERT / REPLACE / REMOVE PACEMAKER     12/17  . JOINT REPLACEMENT Right 2012   partial knee replacement  . PACEMAKER INSERTION Left 07/17/2016   Procedure: INSERTION PACEMAKER;  Surgeon: Isaias Cowman, MD;  Location: [facility] OR[facility];  Service: Cardiovascular;  Laterality: Left;  . TONSILLECTOMY  1950  . TOTAL KNEE ARTHROPLASTY Left 09/30/2017   Procedure: TOTAL KNEE ARTHROPLASTY;  Surgeon: Hessie Knows, MD;  Location: [facility];  Service: Orthopedics;  Laterality: Left;  . TRANSURETHRAL RESECTION OF PROSTATE      Home Medications:  Allergies as of 10/21/2018      Reactions   Mold Extract [trichophyton] Swelling, Other (See Comments)   Tightness in throat   Dog Epithelium Swelling, Other (See Comments)   Also, cats too He  is near the animals and does okay but allergy testing indicates that he   Is allergic to animals   Other Swelling   Allergic to a variety of trees that surround his home. Allergy testing indicated that  this is an allergy. Took allergy shots for many years. Stopped 1 yr ago   Tree Extract Swelling   Allergic to a variety of trees that surround his home. Allergy testing indicated that  this is an allergy.  Took allergy shots for many years. Stopped 1 yr ago   Codeine Nausea And  Vomiting   Sulfa Antibiotics Nausea And Vomiting      Medication List       Accurate as of October 21, 2018  8:41 AM. Always use your most recent med list.        acetaminophen 650 MG CR tablet Commonly known as:  TYLENOL Take 1,300 mg by mouth every 8 (eight) hours as needed for pain.   amLODipine 5 MG tablet Commonly known as:  NORVASC Take 1 tablet (5 mg total) by mouth daily.   aspirin EC 81 MG tablet Take 81 mg by mouth daily.   FISH OIL PO Take 1 capsule by mouth 2 (two) times daily.   flecainide 100 MG tablet Commonly known as:  TAMBOCOR Take 100 mg by mouth 2 (two) times daily.   fluticasone 50 MCG/ACT nasal spray Commonly known as:  FLONASE Place 2 sprays into both nostrils daily as needed for allergies.   losartan 100 MG tablet Commonly known as:  COZAAR Take 100 mg by mouth at bedtime.   memantine 10 MG tablet Commonly known as:  NAMENDA Take by mouth.   metoprolol succinate 25 MG 24 hr tablet Commonly known as:  TOPROL-XL   metoprolol tartrate 25 MG tablet Commonly known as:  LOPRESSOR Take 25 mg by mouth 2 (two) times daily.   nystatin powder Commonly known as:  MYCOSTATIN/NYSTOP Apply topically 4 (four) times daily.   omeprazole 20 MG capsule Commonly known as:  PRILOSEC 2 (two) times daily before a meal.   oxybutynin 5 MG tablet Commonly known as:  DITROPAN Take 1 tablet (5 mg total) by mouth every 8 (eight) hours as needed for bladder spasms.   sildenafil 20 MG tablet Commonly known as:  REVATIO Take 20 mg by mouth.   tadalafil 20 MG tablet Commonly known as:  ADCIRCA/CIALIS Take by mouth.   tamsulosin 0.4 MG Caps capsule Commonly known as:  Flomax Take 1 capsule (0.4 mg total) by mouth daily.   warfarin 5 MG tablet Commonly known as:  COUMADIN Take by mouth.       Allergies:  Allergies  Allergen Reactions  . Mold Extract [Trichophyton] Swelling and Other (See Comments)    Tightness in throat  . Dog Epithelium Swelling  and Other (See Comments)    Also, cats too He is near the animals and does okay but allergy testing indicates that he   Is allergic to animals    . Other Swelling    Allergic to a variety of trees that surround his home. Allergy testing indicated that  this is an allergy. Took allergy shots for many years. Stopped 1 yr ago  . Tree Extract Swelling    Allergic to a variety of trees that surround his home. Allergy testing indicated that  this is an allergy.  Took allergy shots for many years. Stopped 1 yr ago  . Codeine Nausea And Vomiting  . Sulfa Antibiotics Nausea And Vomiting  Family History: No family history on file.  Social History:  reports that he quit smoking about 52 years ago. His smoking use included cigarettes. He smoked 2.00 packs per day. He has never used smokeless tobacco. He reports that he does not drink alcohol or use drugs.  ROS: UROLOGY Frequent Urination?: No Hard to postpone urination?: No Burning/pain with urination?: No Get up at night to urinate?: Yes Leakage of urine?: Yes Urine stream starts and stops?: No Trouble starting stream?: No Do you have to strain to urinate?: No Blood in urine?: No Urinary tract infection?: No Sexually transmitted disease?: No Injury to kidneys or bladder?: No Painful intercourse?: No Weak stream?: No Erection problems?: No Penile pain?: No  Gastrointestinal Nausea?: No Vomiting?: No Indigestion/heartburn?: No Diarrhea?: No Constipation?: No  Constitutional Fever: No Night sweats?: No Weight loss?: No Fatigue?: No  Skin Skin rash/lesions?: No Itching?: Yes  Eyes Blurred vision?: No Double vision?: No  Ears/Nose/Throat Sore throat?: No Sinus problems?: No  Hematologic/Lymphatic Swollen glands?: No Easy bruising?: No  Cardiovascular Leg swelling?: No Chest pain?: No  Respiratory Cough?: No Shortness of breath?: No  Endocrine Excessive thirst?: No  Musculoskeletal Back pain?: Yes  Joint pain?: Yes  Neurological Headaches?: No Dizziness?: No  Psychologic Depression?: No Anxiety?: No  Physical Exam: BP 129/74 (BP Location: Left Arm, Patient Position: Sitting, Cuff Size: Normal)   Pulse 71   Ht 5\' 11"  (1.803 m)   Wt 239 lb (108.4 kg)   BMI 33.33 kg/m   Constitutional:  Alert and oriented, No acute distress. HEENT: [place] AT, moist mucus membranes.  Trachea midline, no masses. Cardiovascular: No clubbing, cyanosis, or edema. Respiratory: Normal respiratory effort, no increased work of breathing. Skin: No rashes, bruises or suspicious lesions. Neurologic: Grossly intact, no focal deficits, moving all 4 extremities. Psychiatric: Normal mood and affect.   Assessment & Plan:   83 year old male with erectile dysfunction who has failed PDE 5 therapy.  Based on his response to sildenafil and his baseline erectile activity it is unlikely a different PDE 5 medication would be effective.  I discussed vacuum erection devices and intracavernosal injections.  He was potentially interested in intracavernosal injections but wanted to discuss with his wife as he thinks she would need to give him the injections.  He was given literature on both and will call back if he desires to pursue.   Abbie Sons, [place] [address], [place], [place] [address] [phone]

## 2018-10-21 NOTE — Patient Instructions (Signed)
Erectile Dysfunction  Erectile dysfunction (ED) is the inability to get or keep an erection in order to have sexual intercourse. Erectile dysfunction may include:   Inability to get an erection.   Lack of enough hardness of the erection to allow penetration.   Loss of the erection before sex is finished.  What are the causes?  This condition may be caused by:   Certain medicines, such as:  ? Pain relievers.  ? Antihistamines.  ? Antidepressants.  ? Blood pressure medicines.  ? Water pills (diuretics).  ? Ulcer medicines.  ? Muscle relaxants.  ? Drugs.   Excessive drinking.   Psychological causes, such as:  ? Anxiety.  ? Depression.  ? Sadness.  ? Exhaustion.  ? Performance fear.  ? Stress.   Physical causes, such as:  ? Artery problems. This may include diabetes, smoking, liver disease, or atherosclerosis.  ? High blood pressure.  ? Hormonal problems, such as low testosterone.  ? Obesity.  ? Nerve problems. This may include back or pelvic injuries, diabetes mellitus, multiple sclerosis, or Parkinson disease.  What are the signs or symptoms?  Symptoms of this condition include:   Inability to get an erection.   Lack of enough hardness of the erection to allow penetration.   Loss of the erection before sex is finished.   Normal erections at some times, but with frequent unsatisfactory episodes.   Low sexual satisfaction in either partner due to erection problems.   A curved penis occurring with erection. The curve may cause pain or the penis may be too curved to allow for intercourse.   Never having nighttime erections.  How is this diagnosed?  This condition is often diagnosed by:   Performing a physical exam to find other diseases or specific problems with the penis.   Asking you detailed questions about the problem.   Performing blood tests to check for diabetes mellitus or to measure hormone levels.   Performing other tests to check for underlying health conditions.   Performing an ultrasound  exam to check for scarring.   Performing a test to check blood flow to the penis.   Doing a sleep study at home to measure nighttime erections.  How is this treated?  This condition may be treated by:   Medicine taken by mouth to help you achieve an erection (oral medicine).   Hormone replacement therapy to replace low testosterone levels.   Medicine that is injected into the penis. Your health care provider may instruct you how to give yourself these injections at home.   Vacuum pump. This is a pump with a ring on it. The pump and ring are placed on the penis and used to create pressure that helps the penis become erect.   Penile implant surgery. In this procedure, you may receive:  ? An inflatable implant. This consists of cylinders, a pump, and a reservoir. The cylinders can be inflated with a fluid that helps to create an erection, and they can be deflated after intercourse.  ? A semi-rigid implant. This consists of two silicone rubber rods. The rods provide some rigidity. They are also flexible, so the penis can both curve downward in its normal position and become straight for sexual intercourse.   Blood vessel surgery, to improve blood flow to the penis. During this procedure, a blood vessel from a different part of the body is placed into the penis to allow blood to flow around (bypass) damaged or blocked blood vessels.     Lifestyle changes, such as exercising more, losing weight, and quitting smoking.  Follow these instructions at home:  Medicines     Take over-the-counter and prescription medicines only as told by your health care provider. Do not increase the dosage without first discussing it with your health care provider.   If you are using self-injections, perform injections as directed by your health care provider. Make sure to avoid any veins that are on the surface of the penis. After giving an injection, apply pressure to the injection site for 5 minutes.  General  instructions   Exercise regularly, as directed by your health care provider. Work with your health care provider to lose weight, if needed.   Do not use any products that contain nicotine or tobacco, such as cigarettes and e-cigarettes. If you need help quitting, ask your health care provider.   Before using a vacuum pump, read the instructions that come with the pump and discuss any questions with your health care provider.   Keep all follow-up visits as told by your health care provider. This is important.  Contact a health care provider if:   You feel nauseous.   You vomit.  Get help right away if:   You are taking oral or injectable medicines and you have an erection that lasts longer than 4 hours. If your health care provider is unavailable, go to the nearest emergency room for evaluation. An erection that lasts much longer than 4 hours can result in permanent damage to your penis.   You have severe pain in your groin or abdomen.   You develop redness or severe swelling of your penis.   You have redness spreading up into your groin or lower abdomen.   You are unable to urinate.   You experience chest pain or a rapid heart beat (palpitations) after taking oral medicines.  Summary   Erectile dysfunction (ED) is the inability to get or keep an erection during sexual intercourse. This problem can usually be treated successfully.   This condition is diagnosed based on a physical exam, your symptoms, and tests to determine the cause. Treatment varies depending on the cause, and may include medicines, hormone therapy, surgery, or vacuum pump.   You may need follow-up visits to make sure that you are using your medicines or devices correctly.   Get help right away if you are taking or injecting medicines and you have an erection that lasts longer than 4 hours.  This information is not intended to replace advice given to you by your health care provider. Make sure you discuss any questions you have with  your health care provider.  Document Released: 07/19/2000 Document Revised: 08/07/2016 Document Reviewed: 08/07/2016  Elsevier Interactive Patient Education  2019 [employer]

## 2018-10-22 ENCOUNTER — Encounter: Payer: Self-pay | Admitting: Urology

## 2018-12-03 ENCOUNTER — Telehealth: Payer: Self-pay | Admitting: Urology

## 2018-12-03 DIAGNOSIS — N200 Calculus of kidney: Secondary | ICD-10-CM

## 2018-12-03 DIAGNOSIS — Z87442 Personal history of urinary calculi: Secondary | ICD-10-CM

## 2018-12-03 DIAGNOSIS — R109 Unspecified abdominal pain: Secondary | ICD-10-CM

## 2018-12-03 NOTE — Telephone Encounter (Signed)
Would initially recommend a KUB and will call with results.  Order was entered

## 2018-12-03 NOTE — Telephone Encounter (Signed)
Pt came in to office this morning and is trying to find out if he needs laser again for kidney stones.  He saw Stoioff in 3/20, Erlene Quan did surgery 9/19.  He prefers Stoioff to do it, if needed.  Do I just need to schedule appt or should he get x-ray?  Pt has pain in back left side.  He can't even lay on his side.

## 2018-12-03 NOTE — Telephone Encounter (Signed)
Called pt with recommendation, Pt will get KUB in the morning.

## 2018-12-04 ENCOUNTER — Ambulatory Visit
Admission: RE | Admit: 2018-12-04 | Discharge: 2018-12-04 | Disposition: A | Discharge status: Home / Self Care | Payer: Medicare Other | Source: Ambulatory Visit | Attending: Urology | Admitting: Urology

## 2018-12-04 DIAGNOSIS — N200 Calculus of kidney: Secondary | ICD-10-CM

## 2018-12-04 DIAGNOSIS — R109 Unspecified abdominal pain: Secondary | ICD-10-CM

## 2018-12-07 NOTE — Telephone Encounter (Signed)
KUB shows no evidence of stone.  Would recommend a renal ultrasound.  Order was entered.  Will call with results.

## 2018-12-07 NOTE — Telephone Encounter (Signed)
Patient is calling about his xray results.  He is still having left sided pain, about 2 inches below ribs.  What do you recommend?

## 2018-12-07 NOTE — Telephone Encounter (Signed)
Patient notified , he states pain when laying down can be a 8/10. Can ultrasound be done tomorrow or wednesday?

## 2018-12-08 NOTE — Telephone Encounter (Signed)
Sarah,  Can you change this order to say ASAP or Stat so that they can schedule it   thanks

## 2018-12-09 ENCOUNTER — Ambulatory Visit
Admission: RE | Admit: 2018-12-09 | Discharge: 2018-12-09 | Disposition: A | Discharge status: Home / Self Care | Payer: Medicare Other | Source: Ambulatory Visit | Attending: Urology | Admitting: Urology

## 2018-12-09 ENCOUNTER — Telehealth: Payer: Self-pay

## 2018-12-09 ENCOUNTER — Other Ambulatory Visit: Payer: Self-pay

## 2018-12-09 DIAGNOSIS — R109 Unspecified abdominal pain: Secondary | ICD-10-CM

## 2018-12-09 DIAGNOSIS — Z87442 Personal history of urinary calculi: Secondary | ICD-10-CM | POA: Insufficient documentation

## 2018-12-09 NOTE — Telephone Encounter (Signed)
-----   Message from Abbie Sons, MD sent at 12/09/2018  9:50 AM EDT ----- Renal ultrasound showed no stones or kidney blockage.  No findings that would account for urologic related pain.  His pain may be musculoskeletal and would recommend he see his PCP for further evaluation.

## 2018-12-09 NOTE — Telephone Encounter (Signed)
Patient notified

## 2019-01-04 ENCOUNTER — Telehealth: Payer: Self-pay | Admitting: Urology

## 2019-01-04 NOTE — Telephone Encounter (Signed)
Pt says it hurts in his penis every time he uses the bathroom.  Pt would like to see Dr Bernardo Heater, afternoon appt if possible.  He is working during the day.

## 2019-01-04 NOTE — Telephone Encounter (Signed)
He needs a lab visit for a urinalysis.  If the urinalysis is clear it will need to be a routine or telephone appointment.

## 2019-01-04 NOTE — Telephone Encounter (Signed)
Please advise on appointment

## 2019-01-05 ENCOUNTER — Other Ambulatory Visit: Payer: Self-pay | Admitting: Family Medicine

## 2019-01-05 DIAGNOSIS — N4889 Other specified disorders of penis: Secondary | ICD-10-CM

## 2019-01-05 NOTE — Telephone Encounter (Signed)
Lab appointment is scheduled for Thursday for a urine drop off per [facility]

## 2019-01-05 NOTE — Telephone Encounter (Signed)
Patient notified and appointment made

## 2019-01-07 ENCOUNTER — Other Ambulatory Visit: Payer: Medicare Other

## 2019-01-07 ENCOUNTER — Other Ambulatory Visit: Payer: Self-pay

## 2019-01-07 DIAGNOSIS — N4889 Other specified disorders of penis: Secondary | ICD-10-CM

## 2019-01-07 LAB — MICROSCOPIC EXAMINATION
Bacteria, UA: NONE SEEN
Epithelial Cells (non renal): NONE SEEN /hpf (ref 0–10)
WBC, UA: NONE SEEN /hpf (ref 0–5)

## 2019-01-07 LAB — URINALYSIS, COMPLETE
Bilirubin, UA: NEGATIVE
Glucose, UA: NEGATIVE
Ketones, UA: NEGATIVE
Leukocytes,UA: NEGATIVE
Nitrite, UA: NEGATIVE
Protein,UA: NEGATIVE
Specific Gravity, UA: 1.015 (ref 1.005–1.030)
Urobilinogen, Ur: 0.2 mg/dL (ref 0.2–1.0)
pH, UA: 5.5 (ref 5.0–7.5)

## 2019-01-08 ENCOUNTER — Telehealth: Payer: Self-pay

## 2019-01-08 NOTE — Telephone Encounter (Signed)
Patient notified, he states still burning , encouraged to drink more water he is only drink 2-3 cups a day. If burning continues after increasing water intake he will call back

## 2019-01-08 NOTE — Telephone Encounter (Signed)
-----   Message from Abbie Sons, MD sent at 01/08/2019  1:57 PM EDT ----- Joseph Houston is negative, can sched telephone or routine office visit 6 weeks

## 2019-01-25 ENCOUNTER — Telehealth: Payer: Self-pay | Admitting: Urology

## 2019-01-25 NOTE — Telephone Encounter (Signed)
Unable to leave message, No VM set up. Patient will need to come in on Nurse visit for a urine if he is having dysuria.

## 2019-01-25 NOTE — Telephone Encounter (Signed)
Pt called and states that he is having painful urination. He would like a call back. He isn't having any other symptoms. Please advise.

## 2019-01-26 NOTE — Telephone Encounter (Signed)
I have tried reaching out to patient several times and am unable to leave a message. If he returns call and is still having urinary issues he will need a Nurse visit for a urine.

## 2019-02-23 ENCOUNTER — Encounter: Payer: Self-pay | Admitting: Urology

## 2019-02-23 ENCOUNTER — Telehealth: Payer: Self-pay | Admitting: Urology

## 2019-02-23 ENCOUNTER — Other Ambulatory Visit: Payer: Self-pay

## 2019-02-23 ENCOUNTER — Ambulatory Visit: Payer: Medicare Other | Admitting: Urology

## 2019-02-23 VITALS — BP 125/75 | HR 73 | Ht 71.0 in | Wt 247.0 lb

## 2019-02-23 DIAGNOSIS — R310 Gross hematuria: Secondary | ICD-10-CM

## 2019-02-23 DIAGNOSIS — R300 Dysuria: Secondary | ICD-10-CM

## 2019-02-23 DIAGNOSIS — Z87442 Personal history of urinary calculi: Secondary | ICD-10-CM

## 2019-02-23 DIAGNOSIS — N4889 Other specified disorders of penis: Secondary | ICD-10-CM

## 2019-02-23 NOTE — Progress Notes (Signed)
02/23/2019 1:33 PM   Joseph Houston 11-15-1935 [id number]  Referring provider: Tracie Harrier, MD [address] [facility] [place],  [place] [address]  Chief Complaint  Patient presents with  . Hematuria    HPI: 83 yo male previously followed for a history of stone disease, BPH and erectile dysfunction.  I last saw him March 2020 for ED.  He had failed PDE 5 inhibitors and I had discussed vacuum erection devices and intracavernosal injections.  He was interested in intracavernosal injections however he states today his wife did not want to pursue.  He called in late April 2020 complaining of pain in his left back region.  KUB was performed which showed no evidence of urinary tract calculi and a renal ultrasound showed bilateral simple cysts but no calculi.    He subsequently developed dysuria and penile pain when voiding.  Urinalysis in early June 2020 was unremarkable.  He states he had intermittent gross hematuria over the weekend which has resolved.   PMH: Past Medical History:  Diagnosis Date  . Anemia    was treated in the past, not now  . Anxiety   . Arthritis    knees  . BPH (benign prostatic hypertrophy)   . Chronic kidney disease    stage 3 ckd  . COPD (chronic obstructive pulmonary disease) ([place])    has had most of his life. has not used an inhaler for years  . Coronary artery disease   . Difficult intubation    pt reports bad sore throat after surgery. pt unsure of this response  . Dyspnea   . Dysrhythmia 2016   atrial fibrillation, bradycardia  . GERD (gastroesophageal reflux disease)   . Headache   . History of hiatal hernia   . History of kidney stones 04/2018  . Hypertension   . Pre-diabetes    dr. Ginette Pitman following  . Presence of permanent cardiac pacemaker 2017  . Psoriasis 2019   elbows  . Restless leg   . Vertigo 1992  . Yeast infection    in groin for 50 years    Surgical History: Past Surgical History:  Procedure  Laterality Date  . CATARACT EXTRACTION W/PHACO Right 09/25/2016   Procedure: CATARACT EXTRACTION PHACO AND INTRAOCULAR LENS PLACEMENT (IOC);  Surgeon: Estill Cotta, MD;  Location: [facility];  Service: Ophthalmology;  Laterality: Right;  [place] 01:58 AP% 18.1 CDE 43.36 Fluid pack # [id number]  . CYSTOSCOPY/URETEROSCOPY/HOLMIUM LASER/STENT PLACEMENT Right 04/13/2018   Procedure: CYSTOSCOPY/URETEROSCOPY/HOLMIUM LASER/STENT PLACEMENT;  Surgeon: Hollice Espy, MD;  Location: [facility];  Service: Urology;  Laterality: Right;  . ELECTROPHYSIOLOGIC STUDY N/A 12/22/2014   Procedure: CARDIOVERSION;  Surgeon: Teodoro Spray, MD;  Location: [facility];  Service: Cardiovascular;  Laterality: N/A;  . FINGER SURGERY Right 1950   index finger cut off half way  . HERNIA REPAIR Bilateral    inguinal repaired x 4  . INSERT / REPLACE / REMOVE PACEMAKER     12/17  . JOINT REPLACEMENT Right 2012   partial knee replacement  . PACEMAKER INSERTION Left 07/17/2016   Procedure: INSERTION PACEMAKER;  Surgeon: Isaias Cowman, MD;  Location: [facility];  Service: Cardiovascular;  Laterality: Left;  . TONSILLECTOMY  1950  . TOTAL KNEE ARTHROPLASTY Left 09/30/2017   Procedure: TOTAL KNEE ARTHROPLASTY;  Surgeon: Hessie Knows, MD;  Location: [facility];  Service: Orthopedics;  Laterality: Left;  . TRANSURETHRAL RESECTION OF PROSTATE      Home Medications:  Allergies as of 02/23/2019  Reactions   Mold Extract [trichophyton] Swelling, Other (See Comments)   Tightness in throat   Dog Epithelium Swelling, Other (See Comments)   Also, cats too He is near the animals and does okay but allergy testing indicates that he   Is allergic to animals   Other Swelling   Allergic to a variety of trees that surround his home. Allergy testing indicated that  this is an allergy. Took allergy shots for many years. Stopped 1 yr ago   Tree Extract Swelling   Allergic to a variety of trees that surround his home. Allergy testing  indicated that  this is an allergy.  Took allergy shots for many years. Stopped 1 yr ago   Codeine Nausea And Vomiting   Sulfa Antibiotics Nausea And Vomiting      Medication List       Accurate as of February 23, 2019  1:33 PM. If you have any questions, ask your nurse or doctor.        acetaminophen 650 MG CR tablet Commonly known as: TYLENOL Take 1,300 mg by mouth every 8 (eight) hours as needed for pain.   amLODipine 5 MG tablet Commonly known as: NORVASC Take 1 tablet (5 mg total) by mouth daily.   aspirin EC 81 MG tablet Take 81 mg by mouth daily.   FISH OIL PO Take 1 capsule by mouth 2 (two) times daily.   flecainide 100 MG tablet Commonly known as: TAMBOCOR Take 100 mg by mouth 2 (two) times daily.   fluticasone 50 MCG/ACT nasal spray Commonly known as: FLONASE Place 2 sprays into both nostrils daily as needed for allergies.   losartan 100 MG tablet Commonly known as: COZAAR Take 100 mg by mouth at bedtime.   memantine 10 MG tablet Commonly known as: NAMENDA Take by mouth.   metoprolol succinate 25 MG 24 hr tablet Commonly known as: TOPROL-XL   metoprolol tartrate 25 MG tablet Commonly known as: LOPRESSOR Take 25 mg by mouth 2 (two) times daily.   nystatin powder Commonly known as: MYCOSTATIN/NYSTOP Apply topically 4 (four) times daily.   omeprazole 20 MG capsule Commonly known as: PRILOSEC 2 (two) times daily before a meal.   oxybutynin 5 MG tablet Commonly known as: DITROPAN Take 1 tablet (5 mg total) by mouth every 8 (eight) hours as needed for bladder spasms.   sildenafil 20 MG tablet Commonly known as: REVATIO Take 20 mg by mouth.   tadalafil 20 MG tablet Commonly known as: CIALIS Take by mouth.   tamsulosin 0.4 MG Caps capsule Commonly known as: Flomax Take 1 capsule (0.4 mg total) by mouth daily.   warfarin 5 MG tablet Commonly known as: COUMADIN Take by mouth.       Allergies:  Allergies  Allergen Reactions  . Mold  Extract [Trichophyton] Swelling and Other (See Comments)    Tightness in throat  . Dog Epithelium Swelling and Other (See Comments)    Also, cats too He is near the animals and does okay but allergy testing indicates that he   Is allergic to animals    . Other Swelling    Allergic to a variety of trees that surround his home. Allergy testing indicated that  this is an allergy. Took allergy shots for many years. Stopped 1 yr ago  . Tree Extract Swelling    Allergic to a variety of trees that surround his home. Allergy testing indicated that  this is an allergy.  Took allergy shots for many years.  Stopped 1 yr ago  . Codeine Nausea And Vomiting  . Sulfa Antibiotics Nausea And Vomiting    Family History: History reviewed. No pertinent family history.  Social History:  reports that he quit smoking about 53 years ago. His smoking use included cigarettes. He smoked 2.00 packs per day. He has never used smokeless tobacco. He reports that he does not drink alcohol or use drugs.  ROS: UROLOGY Frequent Urination?: No Hard to postpone urination?: No Burning/pain with urination?: No Get up at night to urinate?: No Leakage of urine?: No Urine stream starts and stops?: No Trouble starting stream?: No Do you have to strain to urinate?: No Blood in urine?: No Urinary tract infection?: No Sexually transmitted disease?: No Injury to kidneys or bladder?: No Painful intercourse?: No Weak stream?: No Erection problems?: No Penile pain?: No  Gastrointestinal Nausea?: No Vomiting?: No Indigestion/heartburn?: No Diarrhea?: No Constipation?: No  Constitutional Fever: No Night sweats?: No Weight loss?: No Fatigue?: No  Skin Skin rash/lesions?: No Itching?: No  Eyes Blurred vision?: No Double vision?: Yes  Ears/Nose/Throat Sore throat?: No Sinus problems?: No  Hematologic/Lymphatic Swollen glands?: No Easy bruising?: No  Cardiovascular Leg swelling?: No Chest pain?: No   Respiratory Cough?: No Shortness of breath?: No  Endocrine Excessive thirst?: No  Musculoskeletal Back pain?: No Joint pain?: No  Neurological Headaches?: No Dizziness?: No  Psychologic Depression?: No Anxiety?: No  Physical Exam: BP 125/75 (BP Location: Left Arm, Patient Position: Sitting, Cuff Size: Normal)   Pulse 73   Ht 5\' 11"  (1.803 m)   Wt 247 lb (112 kg)   BMI 34.45 kg/m   Constitutional:  Alert and oriented, No acute distress. HEENT: [place] AT, moist mucus membranes.  Trachea midline, no masses. Cardiovascular: No clubbing, cyanosis, or edema. Respiratory: Normal respiratory effort, no increased work of breathing. GI: Abdomen is soft, nontender, nondistended, no abdominal masses GU: No CVA tenderness Lymph: No cervical or inguinal lymphadenopathy. Skin: No rashes, bruises or suspicious lesions. Neurologic: Grossly intact, no focal deficits, moving all 4 extremities. Psychiatric: Normal mood and affect.   Assessment & Plan:   83 year old male with history of stone disease and recent episode of left flank pain followed by dysuria and penile pain when voiding along with a recent episode of hematuria.  Recommended stone protocol CT of the abdomen pelvis to evaluate for distal ureteral calculus.  If no calculi identified he will return for cystoscopy.  Abbie Sons, [place] [address] [place], [place] [address] [phone]

## 2019-02-23 NOTE — Telephone Encounter (Signed)
Pt asks that when calling to schedule CT and results to please let his phone ring until he answers it, it's in his pocket and he's unable to answer quickly.   FYI

## 2019-03-05 ENCOUNTER — Other Ambulatory Visit: Payer: Self-pay

## 2019-03-05 ENCOUNTER — Ambulatory Visit
Admission: RE | Admit: 2019-03-05 | Discharge: 2019-03-05 | Disposition: A | Discharge status: Home / Self Care | Payer: Medicare Other | Source: Ambulatory Visit | Attending: Urology | Admitting: Urology

## 2019-03-05 DIAGNOSIS — N4889 Other specified disorders of penis: Secondary | ICD-10-CM | POA: Insufficient documentation

## 2019-03-05 DIAGNOSIS — R310 Gross hematuria: Secondary | ICD-10-CM | POA: Diagnosis not present

## 2019-03-05 DIAGNOSIS — Z87442 Personal history of urinary calculi: Secondary | ICD-10-CM | POA: Insufficient documentation

## 2019-03-08 ENCOUNTER — Telehealth: Payer: Self-pay | Admitting: Urology

## 2019-03-08 NOTE — Telephone Encounter (Signed)
APP MADE PT IS AWARE

## 2019-03-08 NOTE — Telephone Encounter (Signed)
-----   Message from Abbie Sons, MD sent at 03/07/2019 12:04 PM EDT ----- CT shows no urinary tract calculi or findings that would explain his urinary symptoms or hematuria.  Recommend scheduling cystoscopy. Pt asks that when calling to schedule CT and results to please let his phone ring until he answers it, it's in his pocket and he's unable to answer quickly.

## 2019-03-09 ENCOUNTER — Telehealth: Payer: Self-pay

## 2019-03-09 NOTE — Telephone Encounter (Signed)
Called patient and advised of results. Cysto scheduled for 04/02/19

## 2019-03-09 NOTE — Telephone Encounter (Signed)
-----   Message from Abbie Sons, MD sent at 03/07/2019 12:04 PM EDT ----- CT shows no urinary tract calculi or findings that would explain his urinary symptoms or hematuria.  Recommend scheduling cystoscopy. Pt asks that when calling to schedule CT and results to please let his phone ring until he answers it, it's in his pocket and he's unable to answer quickly.

## 2019-04-02 ENCOUNTER — Other Ambulatory Visit: Payer: Self-pay

## 2019-04-02 ENCOUNTER — Encounter: Payer: Self-pay | Admitting: Urology

## 2019-04-02 ENCOUNTER — Ambulatory Visit: Payer: Medicare Other | Admitting: Urology

## 2019-04-02 VITALS — BP 128/73 | HR 76 | Ht 71.0 in | Wt 247.0 lb

## 2019-04-02 DIAGNOSIS — R310 Gross hematuria: Secondary | ICD-10-CM | POA: Diagnosis not present

## 2019-04-02 LAB — URINALYSIS, COMPLETE
Bilirubin, UA: NEGATIVE
Glucose, UA: NEGATIVE
Ketones, UA: NEGATIVE
Leukocytes,UA: NEGATIVE
Nitrite, UA: NEGATIVE
Protein,UA: NEGATIVE
Specific Gravity, UA: 1.025 (ref 1.005–1.030)
Urobilinogen, Ur: 1 mg/dL (ref 0.2–1.0)
pH, UA: 5.5 (ref 5.0–7.5)

## 2019-04-02 LAB — MICROSCOPIC EXAMINATION: Bacteria, UA: NONE SEEN

## 2019-04-02 MED ORDER — LIDOCAINE HCL URETHRAL/MUCOSAL 2 % EX GEL
1.0000 "application " | Freq: Once | CUTANEOUS | Status: AC
  Administered 2019-04-02: 1 via URETHRAL

## 2019-04-02 NOTE — Progress Notes (Signed)
   04/02/19  CC:  Chief Complaint  Patient presents with  . Cysto    HPI: 83 year old male with dysuria and penile pain.  He has had a negative renal ultrasound and CT abdomen/pelvis.  Blood pressure 128/73, pulse 76, height 5\' 11"  (1.803 m), weight 247 lb (112 kg). NED. A&Ox3.   No respiratory distress   Abd soft, NT, ND Normal phallus with bilateral descended testicles  Cystoscopy Procedure Note  Patient identification was confirmed, informed consent was obtained, and patient was prepped using Betadine solution.  Lidocaine jelly was administered per urethral meatus.     Pre-Procedure: - Inspection reveals a normal caliber ureteral meatus.  Procedure: The flexible cystoscope was introduced without difficulty - No urethral strictures/lesions are present. - Enlarged prostate with hypervascularity and friability - Moderate elevation bladder neck - Bilateral ureteral orifices identified - Bladder mucosa  reveals no ulcers, tumors, or lesions - No bladder stones -Moderate trabeculation  Retroflexion shows no abnormalities   Post-Procedure: - Patient tolerated the procedure well  Assessment/ Plan: No bladder abnormalities identified.  Cystoscopy remarkable for BPH.  No urethral foreign bodies or calculi.  His symptoms may be secondary to prostatic inflammation.  He is on tamsulosin and will add finasteride 5 mg daily.  Return in about 3 months (around 07/03/2019) for Recheck.  Abbie Sons, MD

## 2019-04-05 ENCOUNTER — Encounter: Payer: Self-pay | Admitting: Urology

## 2019-04-05 MED ORDER — FINASTERIDE 5 MG PO TABS: 5.0000 mg | ORAL_TABLET | Freq: Every day | ORAL | 3 refills | Status: DC

## 2019-07-08 ENCOUNTER — Ambulatory Visit: Payer: Medicare Other | Admitting: Urology

## 2019-07-08 ENCOUNTER — Other Ambulatory Visit: Payer: Self-pay

## 2019-07-08 ENCOUNTER — Encounter: Payer: Self-pay | Admitting: Urology

## 2019-07-08 VITALS — BP 149/77 | HR 70 | Ht 71.0 in | Wt 247.0 lb

## 2019-07-08 DIAGNOSIS — Z87442 Personal history of urinary calculi: Secondary | ICD-10-CM | POA: Diagnosis not present

## 2019-07-08 MED ORDER — AMITRIPTYLINE HCL 10 MG PO TABS: 10.0000 mg | ORAL_TABLET | Freq: Every day | ORAL | 1 refills | Status: DC

## 2019-07-08 NOTE — Progress Notes (Signed)
07/08/2019 1:09 PM   Joseph Houston 1936-04-04 [id number]  Referring provider: Tracie Harrier, MD [address] [facility] [place],  [place] [address]  Chief Complaint  Patient presents with  . Follow-up    HPI: Most recently seen for chronic dysuria and penile pain.  He had a negative renal ultrasound and CT abdomen pelvis.  Cystoscopy performed at last visit showed no stricture and was remarkable for BPH.  He was on tamsulosin and finasteride was added to see if this makes any difference in his symptoms.  He has taken the finasteride regular for 3 months and see no real change in his pain.  PMH: Past Medical History:  Diagnosis Date  . Anemia    was treated in the past, not now  . Anxiety   . Arthritis    knees  . BPH (benign prostatic hypertrophy)   . Chronic kidney disease    stage 3 ckd  . COPD (chronic obstructive pulmonary disease) ([place])    has had most of his life. has not used an inhaler for years  . Coronary artery disease   . Difficult intubation    pt reports bad sore throat after surgery. pt unsure of this response  . Dyspnea   . Dysrhythmia 2016   atrial fibrillation, bradycardia  . GERD (gastroesophageal reflux disease)   . Headache   . History of hiatal hernia   . History of kidney stones 04/2018  . Hypertension   . Pre-diabetes    dr. Ginette Pitman following  . Presence of permanent cardiac pacemaker 2017  . Psoriasis 2019   elbows  . Restless leg   . Vertigo 1992  . Yeast infection    in groin for 50 years    Surgical History: Past Surgical History:  Procedure Laterality Date  . CATARACT EXTRACTION W/PHACO Right 09/25/2016   Procedure: CATARACT EXTRACTION PHACO AND INTRAOCULAR LENS PLACEMENT (IOC);  Surgeon: Estill Cotta, MD;  Location: [facility];  Service: Ophthalmology;  Laterality: Right;  [place] 01:58 AP% 18.1 CDE 43.36 Fluid pack # [id number]  . CYSTOSCOPY/URETEROSCOPY/HOLMIUM LASER/STENT PLACEMENT Right 04/13/2018   Procedure: CYSTOSCOPY/URETEROSCOPY/HOLMIUM LASER/STENT PLACEMENT;  Surgeon: Hollice Espy, MD;  Location: [facility];  Service: Urology;  Laterality: Right;  . ELECTROPHYSIOLOGIC STUDY N/A 12/22/2014   Procedure: CARDIOVERSION;  Surgeon: Teodoro Spray, MD;  Location: [facility];  Service: Cardiovascular;  Laterality: N/A;  . FINGER SURGERY Right 1950   index finger cut off half way  . HERNIA REPAIR Bilateral    inguinal repaired x 4  . INSERT / REPLACE / REMOVE PACEMAKER     12/17  . JOINT REPLACEMENT Right 2012   partial knee replacement  . PACEMAKER INSERTION Left 07/17/2016   Procedure: INSERTION PACEMAKER;  Surgeon: Isaias Cowman, MD;  Location: [facility];  Service: Cardiovascular;  Laterality: Left;  . TONSILLECTOMY  1950  . TOTAL KNEE ARTHROPLASTY Left 09/30/2017   Procedure: TOTAL KNEE ARTHROPLASTY;  Surgeon: Hessie Knows, MD;  Location: [facility];  Service: Orthopedics;  Laterality: Left;  . TRANSURETHRAL RESECTION OF PROSTATE      Home Medications:  Allergies as of 07/08/2019      Reactions   Mold Extract [trichophyton] Swelling, Other (See Comments)   Tightness in throat   Dog Epithelium Swelling, Other (See Comments)   Also, cats too He is near the animals and does okay but allergy testing indicates that he   Is allergic to animals   Other Swelling   Allergic to a variety of  trees that surround his home. Allergy testing indicated that  this is an allergy. Took allergy shots for many years. Stopped 1 yr ago   Tree Extract Swelling   Allergic to a variety of trees that surround his home. Allergy testing indicated that  this is an allergy.  Took allergy shots for many years. Stopped 1 yr ago   Codeine Nausea And Vomiting   Sulfa Antibiotics Nausea And Vomiting      Medication List       Accurate as of July 08, 2019  1:09 PM. If you have any questions, ask your nurse or doctor.        STOP taking these medications   warfarin 5 MG tablet Commonly known as:  COUMADIN Stopped by: Abbie Sons, MD     TAKE these medications   acetaminophen 650 MG CR tablet Commonly known as: TYLENOL Take 1,300 mg by mouth every 8 (eight) hours as needed for pain.   amLODipine 5 MG tablet Commonly known as: NORVASC Take 1 tablet (5 mg total) by mouth daily.   aspirin EC 81 MG tablet Take 81 mg by mouth daily.   Eliquis 5 MG Tabs tablet Generic drug: apixaban SMARTSIG:1 Tablet(s) By Mouth Every 12 Hours   finasteride 5 MG tablet Commonly known as: PROSCAR Take 1 tablet (5 mg total) by mouth daily.   FISH OIL PO Take 1 capsule by mouth 2 (two) times daily.   flecainide 100 MG tablet Commonly known as: TAMBOCOR Take 100 mg by mouth 2 (two) times daily.   losartan 100 MG tablet Commonly known as: COZAAR Take 100 mg by mouth at bedtime.   memantine 10 MG tablet Commonly known as: NAMENDA Take by mouth.   metoprolol succinate 25 MG 24 hr tablet Commonly known as: TOPROL-XL   metoprolol tartrate 25 MG tablet Commonly known as: LOPRESSOR Take 25 mg by mouth 2 (two) times daily.   omeprazole 20 MG capsule Commonly known as: PRILOSEC 2 (two) times daily before a meal.   oxybutynin 5 MG tablet Commonly known as: DITROPAN Take 1 tablet (5 mg total) by mouth every 8 (eight) hours as needed for bladder spasms.   sildenafil 20 MG tablet Commonly known as: REVATIO Take 20 mg by mouth.   tadalafil 20 MG tablet Commonly known as: CIALIS Take by mouth.   tamsulosin 0.4 MG Caps capsule Commonly known as: Flomax Take 1 capsule (0.4 mg total) by mouth daily.       Allergies:  Allergies  Allergen Reactions  . Mold Extract [Trichophyton] Swelling and Other (See Comments)    Tightness in throat  . Dog Epithelium Swelling and Other (See Comments)    Also, cats too He is near the animals and does okay but allergy testing indicates that he   Is allergic to animals    . Other Swelling    Allergic to a variety of trees that surround his  home. Allergy testing indicated that  this is an allergy. Took allergy shots for many years. Stopped 1 yr ago  . Tree Extract Swelling    Allergic to a variety of trees that surround his home. Allergy testing indicated that  this is an allergy.  Took allergy shots for many years. Stopped 1 yr ago  . Codeine Nausea And Vomiting  . Sulfa Antibiotics Nausea And Vomiting    Family History: No family history on file.  Social History:  reports that he quit smoking about 53 years ago. His smoking use  included cigarettes. He smoked 2.00 packs per day. He has never used smokeless tobacco. He reports that he does not drink alcohol or use drugs.  ROS: UROLOGY Frequent Urination?: Yes Hard to postpone urination?: No Burning/pain with urination?: Yes Get up at night to urinate?: No Leakage of urine?: No Urine stream starts and stops?: No Trouble starting stream?: No Do you have to strain to urinate?: No Blood in urine?: No Urinary tract infection?: No Sexually transmitted disease?: No Injury to kidneys or bladder?: No Painful intercourse?: No Weak stream?: No Erection problems?: No Penile pain?: No  Gastrointestinal Nausea?: No Vomiting?: No Indigestion/heartburn?: No Diarrhea?: No Constipation?: No  Constitutional Fever: No Night sweats?: No Weight loss?: No Fatigue?: No  Skin Skin rash/lesions?: No Itching?: No  Eyes Blurred vision?: No Double vision?: No  Ears/Nose/Throat Sore throat?: No Sinus problems?: No  Hematologic/Lymphatic Swollen glands?: No Easy bruising?: Yes  Cardiovascular Leg swelling?: No Chest pain?: No  Respiratory Cough?: No Shortness of breath?: No  Endocrine Excessive thirst?: No  Musculoskeletal Back pain?: No Joint pain?: No  Neurological Headaches?: No Dizziness?: No  Psychologic Depression?: No Anxiety?: No  Physical Exam: BP (!) 149/77 (BP Location: Left Arm, Patient Position: Sitting, Cuff Size: Normal)   Pulse 70    Ht 5\' 11"  (1.803 m)   Wt 247 lb (112 kg)   BMI 34.45 kg/m   Constitutional:  Alert and oriented, No acute distress. HEENT: [place] AT, moist mucus membranes.  Trachea midline, no masses. Cardiovascular: No clubbing, cyanosis, or edema. Respiratory: Normal respiratory effort, no increased work of breathing. GI: Abdomen is soft, nontender, nondistended, no abdominal masses GU: Prostate 50 g, smooth.  Moderately tender which does cause some penile discomfort. Lymph: No cervical or inguinal lymphadenopathy. Skin: No rashes, bruises or suspicious lesions. Neurologic: Grossly intact, no focal deficits, moving all 4 extremities. Psychiatric: Normal mood and affect.    Assessment & Plan:   83 y.o. male with chronic penile pain and dysuria.  Evaluation negative to date.  No improvement with finasteride which he may discontinue.  Trial low-dose amitriptyline 10 mg at bedtime.  If no side effects and no improvement he was instructed to call back in 1 month and can titrate the dose.  Follow-up with Larene Beach in 6 weeks for symptom reassessment.   Abbie Sons, [place] [address] [place], [place] [address] [phone]

## 2019-07-09 LAB — URINALYSIS, COMPLETE
Bilirubin, UA: NEGATIVE
Glucose, UA: NEGATIVE
Ketones, UA: NEGATIVE
Leukocytes,UA: NEGATIVE
Nitrite, UA: NEGATIVE
Protein,UA: NEGATIVE
Specific Gravity, UA: 1.02 (ref 1.005–1.030)
Urobilinogen, Ur: 0.2 mg/dL (ref 0.2–1.0)
pH, UA: 6 (ref 5.0–7.5)

## 2019-07-09 LAB — MICROSCOPIC EXAMINATION: Bacteria, UA: NONE SEEN

## 2019-08-12 ENCOUNTER — Telehealth: Payer: Self-pay | Admitting: Urology

## 2019-08-12 MED ORDER — AMITRIPTYLINE HCL 25 MG PO TABS: 25.0000 mg | ORAL_TABLET | Freq: Every day | ORAL | 0 refills | Status: DC

## 2019-08-12 NOTE — Telephone Encounter (Signed)
rx sent

## 2019-08-12 NOTE — Telephone Encounter (Signed)
Pt requests his dosage of Elavil be increased, he states that he still having painful urination.

## 2019-08-18 NOTE — Progress Notes (Addendum)
08/19/2019 9:53 AM   Joseph Houston June 10, 1936 [id number]  Referring provider: Tracie Harrier, MD [address] [facility] [place],  [place] [address]  Chief Complaint  Patient presents with  . Nephrolithiasis    HPI: [name] is an 84 year old male with a history of nephrolithiasis, BPH with LU TS, penile pain and dysuria who presents today for follow up  History of nephrolithiasis 04/2018 right URS for 5 mm right renal stone.  No stones seen on 02/2019 CT.  BPH WITH LUTS  (prostate and/or bladder) IPSS score: 22/4   PVR: 0 mL   Major complaint(s):  Frequency, dysuria and incontinence since June. Denies any dysuria, hematuria or suprapubic pain.   Currently taking: amitriptyline 25 mg daily.  He just started the medication yesterday.   Dr. Ginette Pitman gave him a prescription for cefuroxime for prostate infection.    Cysto with Dr.  Bernardo Heater 03/2019 Enlarged prostate with hypervascularity and friability.  Moderate elevation bladder neck.   Denies any recent fevers, chills, nausea or vomiting.  He states that he purchased a "sex drug" over the Internet and stated he starting having the pain with urination after taking those medications.     IPSS    Row Name 08/19/19 0900         International Prostate Symptom Score   How often have you had the sensation of not emptying your bladder?  About half the time     How often have you had to urinate less than every two hours?  About half the time     How often have you found you stopped and started again several times when you urinated?  More than half the time     How often have you found it difficult to postpone urination?  About half the time     How often have you had a weak urinary stream?  About half the time     How often have you had to strain to start urination?  Less than half the time     How many times did you typically get up at night to urinate?  4 Times     Total IPSS Score  22       Quality of Life due to urinary symptoms   If you were to spend the rest of your life with your urinary condition just the way it is now how would you feel about that?  Mostly Disatisfied        Score:  1-7 Mild 8-19 Moderate 20-35 Severe   PMH: Past Medical History:  Diagnosis Date  . Anemia    was treated in the past, not now  . Anxiety   . Arthritis    knees  . BPH (benign prostatic hypertrophy)   . Chronic kidney disease    stage 3 ckd  . COPD (chronic obstructive pulmonary disease) ([place])    has had most of his life. has not used an inhaler for years  . Coronary artery disease   . Difficult intubation    pt reports bad sore throat after surgery. pt unsure of this response  . Dyspnea   . Dysrhythmia 2016   atrial fibrillation, bradycardia  . GERD (gastroesophageal reflux disease)   . Headache   . History of hiatal hernia   . History of kidney stones 04/2018  . Hypertension   . Pre-diabetes    dr. Ginette Pitman following  . Presence of permanent cardiac pacemaker 2017  .  Psoriasis 2019   elbows  . Restless leg   . Vertigo 1992  . Yeast infection    in groin for 50 years    Surgical History: Past Surgical History:  Procedure Laterality Date  . CATARACT EXTRACTION W/PHACO Right 09/25/2016   Procedure: CATARACT EXTRACTION PHACO AND INTRAOCULAR LENS PLACEMENT (IOC);  Surgeon: Estill Cotta, MD;  Location: [facility];  Service: Ophthalmology;  Laterality: Right;  [place] 01:58 AP% 18.1 CDE 43.36 Fluid pack # [id number]  . CYSTOSCOPY/URETEROSCOPY/HOLMIUM LASER/STENT PLACEMENT Right 04/13/2018   Procedure: CYSTOSCOPY/URETEROSCOPY/HOLMIUM LASER/STENT PLACEMENT;  Surgeon: Hollice Espy, MD;  Location: [facility];  Service: Urology;  Laterality: Right;  . ELECTROPHYSIOLOGIC STUDY N/A 12/22/2014   Procedure: CARDIOVERSION;  Surgeon: Teodoro Spray, MD;  Location: [facility];  Service: Cardiovascular;  Laterality: N/A;  . FINGER SURGERY Right 1950   index finger cut off half way  .  HERNIA REPAIR Bilateral    inguinal repaired x 4  . INSERT / REPLACE / REMOVE PACEMAKER     12/17  . JOINT REPLACEMENT Right 2012   partial knee replacement  . PACEMAKER INSERTION Left 07/17/2016   Procedure: INSERTION PACEMAKER;  Surgeon: Isaias Cowman, MD;  Location: [facility];  Service: Cardiovascular;  Laterality: Left;  . TONSILLECTOMY  1950  . TOTAL KNEE ARTHROPLASTY Left 09/30/2017   Procedure: TOTAL KNEE ARTHROPLASTY;  Surgeon: Hessie Knows, MD;  Location: [facility];  Service: Orthopedics;  Laterality: Left;  . TRANSURETHRAL RESECTION OF PROSTATE      Home Medications:  Allergies as of 08/19/2019      Reactions   Mold Extract [trichophyton] Swelling, Other (See Comments)   Tightness in throat   Dog Epithelium Swelling, Other (See Comments)   Also, cats too He is near the animals and does okay but allergy testing indicates that he   Is allergic to animals   Other Swelling   Allergic to a variety of trees that surround his home. Allergy testing indicated that  this is an allergy. Took allergy shots for many years. Stopped 1 yr ago   Tree Extract Swelling   Allergic to a variety of trees that surround his home. Allergy testing indicated that  this is an allergy.  Took allergy shots for many years. Stopped 1 yr ago   Codeine Nausea And Vomiting   Sulfa Antibiotics Nausea And Vomiting      Medication List       Accurate as of August 19, 2019  9:53 AM. If you have any questions, ask your nurse or doctor.        acetaminophen 650 MG CR tablet Commonly known as: TYLENOL Take 1,300 mg by mouth every 8 (eight) hours as needed for pain.   amitriptyline 25 MG tablet Commonly known as: ELAVIL Take 1 tablet (25 mg total) by mouth at bedtime.   amLODipine 5 MG tablet Commonly known as: NORVASC Take 1 tablet (5 mg total) by mouth daily.   aspirin EC 81 MG tablet Take 81 mg by mouth daily.   cefUROXime 250 MG tablet Commonly known as: CEFTIN Take by mouth.     Cholecalciferol 50 MCG (2000 UT) Tabs Take by mouth.   cyanocobalamin 1000 MCG tablet Take by mouth.   diclofenac Sodium 1 % Gel Commonly known as: VOLTAREN Apply topically.   Eliquis 5 MG Tabs tablet Generic drug: apixaban SMARTSIG:1 Tablet(s) By Mouth Every 12 Hours   finasteride 5 MG tablet Commonly known as: PROSCAR Take 1 tablet (5 mg total) by mouth daily.  FISH OIL PO Take 1 capsule by mouth 2 (two) times daily.   flecainide 100 MG tablet Commonly known as: TAMBOCOR Take 100 mg by mouth 2 (two) times daily.   losartan 100 MG tablet Commonly known as: COZAAR Take 100 mg by mouth at bedtime.   memantine 10 MG tablet Commonly known as: NAMENDA Take by mouth.   metoprolol succinate 25 MG 24 hr tablet Commonly known as: TOPROL-XL   metoprolol tartrate 25 MG tablet Commonly known as: LOPRESSOR Take 25 mg by mouth 2 (two) times daily.   omeprazole 20 MG capsule Commonly known as: PRILOSEC 2 (two) times daily before a meal.   oxybutynin 5 MG tablet Commonly known as: DITROPAN Take 1 tablet (5 mg total) by mouth every 8 (eight) hours as needed for bladder spasms.   sildenafil 20 MG tablet Commonly known as: REVATIO Take 20 mg by mouth.   tadalafil 20 MG tablet Commonly known as: CIALIS Take by mouth.   tamsulosin 0.4 MG Caps capsule Commonly known as: Flomax Take 1 capsule (0.4 mg total) by mouth daily.       Allergies:  Allergies  Allergen Reactions  . Mold Extract [Trichophyton] Swelling and Other (See Comments)    Tightness in throat  . Dog Epithelium Swelling and Other (See Comments)    Also, cats too He is near the animals and does okay but allergy testing indicates that he   Is allergic to animals    . Other Swelling    Allergic to a variety of trees that surround his home. Allergy testing indicated that  this is an allergy. Took allergy shots for many years. Stopped 1 yr ago  . Tree Extract Swelling    Allergic to a variety of  trees that surround his home. Allergy testing indicated that  this is an allergy.  Took allergy shots for many years. Stopped 1 yr ago  . Codeine Nausea And Vomiting  . Sulfa Antibiotics Nausea And Vomiting    Family History: History reviewed. No pertinent family history.  Social History:  reports that he quit smoking about 53 years ago. His smoking use included cigarettes. He smoked 2.00 packs per day. He has never used smokeless tobacco. He reports that he does not drink alcohol or use drugs.  ROS: UROLOGY Frequent Urination?: Yes Hard to postpone urination?: No Burning/pain with urination?: Yes Get up at night to urinate?: No Leakage of urine?: Yes Urine stream starts and stops?: Yes Trouble starting stream?: No Do you have to strain to urinate?: No Blood in urine?: No Urinary tract infection?: No Sexually transmitted disease?: No Injury to kidneys or bladder?: No Painful intercourse?: No Weak stream?: No Erection problems?: Yes Penile pain?: No  Gastrointestinal Nausea?: No Vomiting?: No Indigestion/heartburn?: No Diarrhea?: No Constipation?: No  Constitutional Fever: No Night sweats?: No Weight loss?: No Fatigue?: No  Skin Skin rash/lesions?: No Itching?: No  Eyes Blurred vision?: No Double vision?: No  Ears/Nose/Throat Sore throat?: No Sinus problems?: No  Hematologic/Lymphatic Swollen glands?: No Easy bruising?: No  Cardiovascular Leg swelling?: No Chest pain?: No  Respiratory Cough?: No Shortness of breath?: No  Endocrine Excessive thirst?: No  Musculoskeletal Back pain?: No Joint pain?: No  Neurological Headaches?: No Dizziness?: No  Psychologic Depression?: No Anxiety?: No  Physical Exam: BP 101/64   Pulse 71   Ht 5\' 11"  (1.803 m)   Wt 246 lb 6.4 oz (111.8 kg)   BMI 34.37 kg/m   Constitutional:  Well nourished. Alert and oriented,  No acute distress. HEENT: Bright AT, mask in place.  Trachea midline, no  masses. Cardiovascular: No clubbing, cyanosis, or edema. Respiratory: Normal respiratory effort, no increased work of breathing. Neurologic: Grossly intact, no focal deficits, moving all 4 extremities. Psychiatric: Normal mood and affect.  Laboratory Data: Lab Results  Component Value Date   WBC 6.7 04/08/2018   HGB 14.6 04/08/2018   HCT 43.8 04/08/2018   MCV 91.6 04/08/2018   PLT 183 04/08/2018    Lab Results  Component Value Date   CREATININE 1.24 04/08/2018    No results found for: PSA  No results found for: TESTOSTERONE  No results found for: HGBA1C  No results found for: TSH  No results found for: CHOL, HDL, CHOLHDL, VLDL, LDLCALC  No results found for: AST No results found for: ALT No components found for: ALKALINEPHOPHATASE No components found for: BILIRUBINTOTAL  No results found for: ESTRADIOL  Urinalysis    Component Value Date/Time   COLORURINE YELLOW (A) 09/17/2017 1440   APPEARANCEUR Clear 07/08/2019 1306   LABSPEC 1.015 09/17/2017 1440   PHURINE 5.0 09/17/2017 1440   GLUCOSEU Negative 07/08/2019 1306   HGBUR SMALL (A) 09/17/2017 1440   BILIRUBINUR Negative 07/08/2019 1306   KETONESUR NEGATIVE 09/17/2017 1440   PROTEINUR Negative 07/08/2019 1306   PROTEINUR NEGATIVE 09/17/2017 1440   NITRITE Negative 07/08/2019 1306   NITRITE NEGATIVE 09/17/2017 1440   LEUKOCYTESUR Negative 07/08/2019 1306    I have reviewed the labs.   Pertinent Imaging: Results for KENDRICKS, BARATZ" (MRN [id number]) as of 08/19/2019 10:42  Ref. Range 08/19/2019 09:59  Scan Result Unknown 0     Assessment & Plan:    1. BPH with LUTS IPSS score is 22/4 Continue conservative management, avoiding bladder irritants and timed voiding's Most bothersome symptoms is/are burning when he urinates Continue Elavil 25 mg qhs RTC in one month for I PSS and PVR  2. Dysuria UA, CT, RUS and cystoscopy were NED He will bring in his pills he got over the Internet so that I  can research the ingredients to see if we can gleen some information as to why he developed his pain with urination   3. History of nephrolithiasis No stones seen on recent CT and RUS   Return in about 1 month (around 09/19/2019) for I PSS .  These notes generated with voice recognition software. I apologize for typographical errors.  [name], PA-C  [facility] [address]  [place], [place] [address] [phone]

## 2019-08-19 ENCOUNTER — Encounter: Payer: Self-pay | Admitting: Urology

## 2019-08-19 ENCOUNTER — Other Ambulatory Visit: Payer: Self-pay

## 2019-08-19 ENCOUNTER — Ambulatory Visit: Payer: Medicare PPO | Admitting: Urology

## 2019-08-19 VITALS — BP 101/64 | HR 71 | Ht 71.0 in | Wt 246.4 lb

## 2019-08-19 DIAGNOSIS — R300 Dysuria: Secondary | ICD-10-CM | POA: Diagnosis not present

## 2019-08-19 DIAGNOSIS — N138 Other obstructive and reflux uropathy: Secondary | ICD-10-CM | POA: Diagnosis not present

## 2019-08-19 DIAGNOSIS — N401 Enlarged prostate with lower urinary tract symptoms: Secondary | ICD-10-CM | POA: Diagnosis not present

## 2019-08-19 DIAGNOSIS — Z87442 Personal history of urinary calculi: Secondary | ICD-10-CM | POA: Diagnosis not present

## 2019-08-19 LAB — BLADDER SCAN AMB NON-IMAGING: Scan Result: 0

## 2019-09-06 ENCOUNTER — Other Ambulatory Visit: Payer: Self-pay

## 2019-09-06 ENCOUNTER — Ambulatory Visit
Admission: RE | Admit: 2019-09-06 | Discharge: 2019-09-06 | Disposition: A | Discharge status: Home / Self Care | Payer: Medicare PPO | Source: Ambulatory Visit | Attending: Physician Assistant | Admitting: Physician Assistant

## 2019-09-06 ENCOUNTER — Encounter: Payer: Self-pay | Admitting: Physician Assistant

## 2019-09-06 ENCOUNTER — Ambulatory Visit: Admit: 2019-09-06 | Payer: Medicare PPO | Admitting: Urology

## 2019-09-06 ENCOUNTER — Ambulatory Visit: Payer: Medicare PPO | Admitting: Physician Assistant

## 2019-09-06 VITALS — BP 114/69 | HR 71 | Ht 71.0 in | Wt 246.0 lb

## 2019-09-06 DIAGNOSIS — R310 Gross hematuria: Secondary | ICD-10-CM | POA: Diagnosis not present

## 2019-09-06 LAB — MICROSCOPIC EXAMINATION
Bacteria, UA: NONE SEEN
RBC: >30 /hpf — AB (ref 0–2)

## 2019-09-06 LAB — URINALYSIS, COMPLETE
Specific Gravity, UA: <1.005 — ABNORMAL LOW (ref 1.005–1.030)
pH, UA: 8.5 — ABNORMAL HIGH (ref 5.0–7.5)

## 2019-09-06 MED ORDER — IOHEXOL 300 MG/ML  SOLN
125.0000 mL | Freq: Once | INTRAMUSCULAR | Status: AC | PRN
  Administered 2019-09-06: 15:00:00 125 mL via INTRAVENOUS

## 2019-09-06 NOTE — H&P (View-Only) (Signed)
09/06/2019 1:08 PM   Joseph Houston [birth year] [id number]  CC: Gross hematuria, right flank pain, nausea, vomiting  HPI: Joseph Houston is a 84 y.o. male with PMH nephrolithiasis, BPH with LUTS, penile pain, and dysuria who presents today for evaluation of gross hematuria.  He was most recently seen by [name] on 08/19/2019 for routine follow-up of the above.  Today, he reports a sudden onset of severe right flank pain that lasted approximately 2 to 3 minutes yesterday and resolved on its own.  Afterward, he went to the restroom and noticed gross hematuria that has persisted.  He states the pain has not returned. He does report associated nausea, vomiting, diarrhea, and fatigue today.  CT stone study on 03/05/2019 revealed bilateral hypoattenuating lesions consistent with cysts as well as a 3 mm left upper pole cortical calcification with no other stones or hydroureteronephrosis.  Cystoscopy with Dr. Bernardo Heater on 04/02/2019 revealed enlarged prostate with hypervascularity and friability with no other abnormalities.  In-office UA today positive for 3+ blood and 3+ protein; urine microscopy with >30 RBCs/HPF.  PMH: Past Medical History:  Diagnosis Date  . Anemia    was treated in the past, not now  . Anxiety   . Arthritis    knees  . BPH (benign prostatic hypertrophy)   . Chronic kidney disease    stage 3 ckd  . COPD (chronic obstructive pulmonary disease) ([place])    has had most of his life. has not used an inhaler for years  . Coronary artery disease   . Difficult intubation    pt reports bad sore throat after surgery. pt unsure of this response  . Dyspnea   . Dysrhythmia 2016   atrial fibrillation, bradycardia  . GERD (gastroesophageal reflux disease)   . Headache   . History of hiatal hernia   . History of kidney stones 04/2018  . Hypertension   . Pre-diabetes    dr. Ginette Pitman following  . Presence of permanent cardiac pacemaker 2017  . Psoriasis 2019   elbows    . Restless leg   . Vertigo 1992  . Yeast infection    in groin for 50 years    Surgical History: Past Surgical History:  Procedure Laterality Date  . CATARACT EXTRACTION W/PHACO Right 09/25/2016   Procedure: CATARACT EXTRACTION PHACO AND INTRAOCULAR LENS PLACEMENT (IOC);  Surgeon: Estill Cotta, MD;  Location: [facility];  Service: Ophthalmology;  Laterality: Right;  [place] 01:58 AP% 18.1 CDE 43.36 Fluid pack # [id number]  . CYSTOSCOPY/URETEROSCOPY/HOLMIUM LASER/STENT PLACEMENT Right 04/13/2018   Procedure: CYSTOSCOPY/URETEROSCOPY/HOLMIUM LASER/STENT PLACEMENT;  Surgeon: Hollice Espy, MD;  Location: [facility];  Service: Urology;  Laterality: Right;  . ELECTROPHYSIOLOGIC STUDY N/A 12/22/2014   Procedure: CARDIOVERSION;  Surgeon: Teodoro Spray, MD;  Location: [facility];  Service: Cardiovascular;  Laterality: N/A;  . FINGER SURGERY Right 1950   index finger cut off half way  . HERNIA REPAIR Bilateral    inguinal repaired x 4  . INSERT / REPLACE / REMOVE PACEMAKER     12/17  . JOINT REPLACEMENT Right 2012   partial knee replacement  . PACEMAKER INSERTION Left 07/17/2016   Procedure: INSERTION PACEMAKER;  Surgeon: Isaias Cowman, MD;  Location: [facility];  Service: Cardiovascular;  Laterality: Left;  . TONSILLECTOMY  1950  . TOTAL KNEE ARTHROPLASTY Left 09/30/2017   Procedure: TOTAL KNEE ARTHROPLASTY;  Surgeon: Hessie Knows, MD;  Location: [facility];  Service: Orthopedics;  Laterality: Left;  . TRANSURETHRAL RESECTION OF PROSTATE  Home Medications:  Allergies as of 09/06/2019      Reactions   Mold Extract [trichophyton] Swelling, Other (See Comments)   Tightness in throat   Dog Epithelium Swelling, Other (See Comments)   Also, cats too He is near the animals and does okay but allergy testing indicates that he   Is allergic to animals   Other Swelling   Allergic to a variety of trees that surround his home. Allergy testing indicated that  this is an allergy. Took allergy  shots for many years. Stopped 1 yr ago   Tree Extract Swelling   Allergic to a variety of trees that surround his home. Allergy testing indicated that  this is an allergy.  Took allergy shots for many years. Stopped 1 yr ago   Codeine Nausea And Vomiting   Sulfa Antibiotics Nausea And Vomiting      Medication List       Accurate as of September 06, 2019  1:08 PM. If you have any questions, ask your nurse or doctor.        acetaminophen 650 MG CR tablet Commonly known as: TYLENOL Take 1,300 mg by mouth every 8 (eight) hours as needed for pain.   amitriptyline 25 MG tablet Commonly known as: ELAVIL Take 1 tablet (25 mg total) by mouth at bedtime.   amLODipine 5 MG tablet Commonly known as: NORVASC Take 1 tablet (5 mg total) by mouth daily.   aspirin EC 81 MG tablet Take 81 mg by mouth daily.   Cholecalciferol 50 MCG (2000 UT) Tabs Take by mouth.   cyanocobalamin 1000 MCG tablet Take by mouth.   diclofenac Sodium 1 % Gel Commonly known as: VOLTAREN Apply topically.   Eliquis 5 MG Tabs tablet Generic drug: apixaban SMARTSIG:1 Tablet(s) By Mouth Every 12 Hours   finasteride 5 MG tablet Commonly known as: PROSCAR Take 1 tablet (5 mg total) by mouth daily.   FISH OIL PO Take 1 capsule by mouth 2 (two) times daily.   flecainide 100 MG tablet Commonly known as: TAMBOCOR Take 100 mg by mouth 2 (two) times daily.   losartan 100 MG tablet Commonly known as: COZAAR Take 100 mg by mouth at bedtime.   memantine 10 MG tablet Commonly known as: NAMENDA Take by mouth.   metoprolol succinate 25 MG 24 hr tablet Commonly known as: TOPROL-XL   metoprolol tartrate 25 MG tablet Commonly known as: LOPRESSOR Take 25 mg by mouth 2 (two) times daily.   omeprazole 20 MG capsule Commonly known as: PRILOSEC 2 (two) times daily before a meal.   oxybutynin 5 MG tablet Commonly known as: DITROPAN Take 1 tablet (5 mg total) by mouth every 8 (eight) hours as needed for bladder  spasms.   sildenafil 20 MG tablet Commonly known as: REVATIO Take 20 mg by mouth.   tadalafil 20 MG tablet Commonly known as: CIALIS Take by mouth.   tamsulosin 0.4 MG Caps capsule Commonly known as: Flomax Take 1 capsule (0.4 mg total) by mouth daily.       Allergies:  Allergies  Allergen Reactions  . Mold Extract [Trichophyton] Swelling and Other (See Comments)    Tightness in throat  . Dog Epithelium Swelling and Other (See Comments)    Also, cats too He is near the animals and does okay but allergy testing indicates that he   Is allergic to animals    . Other Swelling    Allergic to a variety of trees that surround his home. Allergy  testing indicated that  this is an allergy. Took allergy shots for many years. Stopped 1 yr ago  . Tree Extract Swelling    Allergic to a variety of trees that surround his home. Allergy testing indicated that  this is an allergy.  Took allergy shots for many years. Stopped 1 yr ago  . Codeine Nausea And Vomiting  . Sulfa Antibiotics Nausea And Vomiting    Family History: No family history on file.  Social History:   reports that he quit smoking about 53 years ago. His smoking use included cigarettes. He smoked 2.00 packs per day. He has never used smokeless tobacco. He reports that he does not drink alcohol or use drugs.  ROS: UROLOGY Frequent Urination?: Yes Hard to postpone urination?: No Burning/pain with urination?: No Get up at night to urinate?: Yes Leakage of urine?: No Urine stream starts and stops?: No Trouble starting stream?: No Do you have to strain to urinate?: No Blood in urine?: Yes Urinary tract infection?: No Sexually transmitted disease?: No Injury to kidneys or bladder?: No Painful intercourse?: No Weak stream?: Yes Erection problems?: No Penile pain?: No  Gastrointestinal Nausea?: Yes Vomiting?: Yes Indigestion/heartburn?: No Diarrhea?: Yes Constipation?: No  Constitutional Fever: No Night  sweats?: No Weight loss?: No Fatigue?: No  Skin Skin rash/lesions?: Yes Itching?: Yes  Eyes Blurred vision?: No Double vision?: No  Ears/Nose/Throat Sore throat?: No Sinus problems?: No  Hematologic/Lymphatic Swollen glands?: No Easy bruising?: Yes  Cardiovascular Leg swelling?: No Chest pain?: No  Respiratory Cough?: No Shortness of breath?: No  Endocrine Excessive thirst?: No  Musculoskeletal Back pain?: No Joint pain?: No  Neurological Headaches?: No Dizziness?: Yes  Psychologic Depression?: No Anxiety?: No  Physical Exam: BP 114/69   Pulse 71   Ht 5\' 11"  (1.803 m)   Wt 246 lb (111.6 kg)   BMI 34.31 kg/m   Constitutional:  Alert and oriented, no acute distress, nontoxic appearing HEENT: [place], AT Cardiovascular: No clubbing, cyanosis, or edema Respiratory: Normal respiratory effort, no increased work of breathing Skin: No rashes, bruises or suspicious lesions Neurologic: Grossly intact, no focal deficits, moving all 4 extremities Psychiatric: Normal mood and affect  Laboratory Data: Results for orders placed or performed in visit on 09/06/19  Microscopic Examination   URINE  Result Value Ref Range   WBC, UA 0-5 0 - 5 /hpf   RBC >30 (A) 0 - 2 /hpf   Epithelial Cells (non renal) 0-10 0 - 10 /hpf   Bacteria, UA None seen None seen/Few  Urinalysis, Complete  Result Value Ref Range   Specific Gravity, UA <1.005 (L) 1.005 - 1.030   pH, UA 8.5 (H) 5.0 - 7.5   Color, UA Red (A) Yellow   Appearance Ur Cloudy (A) Clear   Protein,UA CANCELED    Glucose, UA CANCELED    Ketones, UA CANCELED    Microscopic Examination See below:   CBC  Result Value Ref Range   WBC 11.8 (H) 3.4 - 10.8 x10E3/uL   RBC 4.71 4.14 - 5.80 x10E6/uL   Hemoglobin 14.1 13.0 - 17.7 g/dL   Hematocrit 41.3 37.5 - 51.0 %   MCV 88 79 - 97 fL   MCH 29.9 26.6 - 33.0 pg   MCHC 34.1 31.5 - 35.7 g/dL   RDW 12.2 11.6 - 15.4 %   Platelets 182 150 - 450 x10E3/uL     Pertinent  Imaging: CT hematuria, 09/06/2019: CLINICAL DATA:  Gross hematuria and right flank pain  EXAM: CT  ABDOMEN AND PELVIS WITHOUT AND WITH CONTRAST  TECHNIQUE: Multidetector CT imaging of the abdomen and pelvis was performed following the standard protocol before and following the bolus administration of intravenous contrast.  CONTRAST:  128mL OMNIPAQUE IOHEXOL 300 MG/ML  SOLN  COMPARISON:  03/05/2019 CT abdomen  FINDINGS: Lower chest: Stable pleural calcifications along the hemidiaphragms. Pacer leads noted. Mild cardiomegaly. Right coronary artery and descending thoracic aortic atherosclerotic calcification.  Hepatobiliary: 5 mm hypodense lesion in segment 4a of the liver on image 11/7 is technically too small to characterize although statistically likely to be benign, and unchanged from the prior exam. Gallbladder unremarkable.  Pancreas: Unremarkable  Spleen: Unremarkable  Adrenals/Urinary Tract: Both adrenal glands appear normal. Bilateral simple appearing renal cysts are present. Some of the hypodense renal lesions are technically too small to characterize, although statistically likely to be cysts.  Marked bladder wall thickening anteriorly and along the right side of the urinary bladder extending from the upper margin of the urinary bladder down towards the inferior margin. This tissue is enhancing and suspicious for a sessile bladder mass given the asymmetry. Stranding in the adjacent space of [place].  No filling defect is identified along the remainder of the urothelium. No urinary tract calculi are identified.  Stomach/Bowel: There is some scattered air-fluid levels in nondilated loops of proximal small bowel. Multifocal low-grade mesenteric edema along small segments of small bowel, for example on image 69/10 and along a separately on image 83/10.  Vascular/Lymphatic: Aortoiliac atherosclerotic vascular disease. Fatty inguinal lymph nodes are once  again observed. No solid pathologic adenopathy identified.  Reproductive: Suspected prior TURP. Mild prostatomegaly.  Other: No supplemental non-categorized findings.  Musculoskeletal: Bilateral inguinal hernia repairs in the past. Rim sclerotic lucent lesion along the iliac side of the lower margin of the right SI joint, probably secondary to arthropathy. Lumbar spondylosis, degenerative disc disease, and congenitally short pedicles causing multilevel impingement in the lumbar spine. Grade 1 degenerative anterolisthesis at L4-5 with loss of disc height and disc desiccation at this level.  IMPRESSION: 1. 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 Cystoscopy recommended. 2. Multifocal low-grade mesenteric edema along segments of small bowel. This is nonspecific but could be due to low-grade enteritis. 3. Other imaging findings of potential clinical significance: Stable pleural calcifications along the hemidiaphragms. Mild cardiomegaly. Coronary atherosclerosis. Bilateral renal cysts. Multilevel lumbar impingement due to spondylosis, degenerative disc disease, and congenitally short pedicles. Rim sclerotic lucent lesion along the iliac side of the right SI joint, probably secondary to arthropathy. Bilateral inguinal hernia repairs in the past.   Electronically Signed   By: Van Clines M.D.   On: 09/06/2019 15:48  I personally reviewed the images referenced above and and note asymmetric, enhancing bladder wall thickening with intact bilateral renal cysts.  Assessment & Plan:   1. Gross hematuria 84 year old male with PMH nephrolithiasis, BPH with LUTS, dysuria, and penile pain here with a 1 day history of sudden onset right flank pain that has since resolved and has been associated with significant gross hematuria.  UA reassuring for infection.  Based on his  clinical picture, I suspected rupture of his known right renal cyst.  I ordered a CBC and stat CT urogram for further evaluation today.  CT findings significant for intact bilateral renal cysts as well as asymmetric and enhancing bladder wall thickening, suspicious for sessile bladder mass/urothelial tumor.  Radiology recommends cystoscopy for further evaluation.  I  contacted the patient via telephone to report the results of his CT scan.  I have scheduled him for cystoscopy with Dr. Bernardo Heater on 09/09/2019.  I counseled him to come to our office with a full bladder and to push fluids in the interim. I counseled the patient on signs and symptoms of gross hematuria requiring urgent evaluation, including thick, ketchup-like urinary output; passage of large clots; dark, maroon-colored urine; lower abdominal pain; lumbar pain; abdominal distention; and the inability to urinate.  I advised him to contact the office for assistance if he develops these symptoms during routine office hours, 8 AM to 5 PM Monday through Friday.  If outside those hours, I advised him to proceed to the emergency department. He expressed understanding.   - Urinalysis, Complete - CT HEMATURIA WORKUP - CBC   Return if symptoms worsen or fail to improve.  Debroah Loop, PA-C  [facility] [address], [place], [place] [address] [phone]

## 2019-09-06 NOTE — Progress Notes (Signed)
09/06/2019 1:08 PM   Sellers Beld Brasington 1935/10/13 [id number]  CC: Gross hematuria, right flank pain, nausea, vomiting  HPI: Joseph Houston is a 84 y.o. male with PMH nephrolithiasis, BPH with LUTS, penile pain, and dysuria who presents today for evaluation of gross hematuria.  He was most recently seen by [name] on 08/19/2019 for routine follow-up of the above.  Today, he reports a sudden onset of severe right flank pain that lasted approximately 2 to 3 minutes yesterday and resolved on its own.  Afterward, he went to the restroom and noticed gross hematuria that has persisted.  He states the pain has not returned. He does report associated nausea, vomiting, diarrhea, and fatigue today.  CT stone study on 03/05/2019 revealed bilateral hypoattenuating lesions consistent with cysts as well as a 3 mm left upper pole cortical calcification with no other stones or hydroureteronephrosis.  Cystoscopy with Dr. Bernardo Heater on 04/02/2019 revealed enlarged prostate with hypervascularity and friability with no other abnormalities.  In-office UA today positive for 3+ blood and 3+ protein; urine microscopy with >30 RBCs/HPF.  PMH: Past Medical History:  Diagnosis Date  . Anemia    was treated in the past, not now  . Anxiety   . Arthritis    knees  . BPH (benign prostatic hypertrophy)   . Chronic kidney disease    stage 3 ckd  . COPD (chronic obstructive pulmonary disease) ([place])    has had most of his life. has not used an inhaler for years  . Coronary artery disease   . Difficult intubation    pt reports bad sore throat after surgery. pt unsure of this response  . Dyspnea   . Dysrhythmia 2016   atrial fibrillation, bradycardia  . GERD (gastroesophageal reflux disease)   . Headache   . History of hiatal hernia   . History of kidney stones 04/2018  . Hypertension   . Pre-diabetes    dr. Ginette Pitman following  . Presence of permanent cardiac pacemaker 2017  . Psoriasis 2019   elbows    . Restless leg   . Vertigo 1992  . Yeast infection    in groin for 50 years    Surgical History: Past Surgical History:  Procedure Laterality Date  . CATARACT EXTRACTION W/PHACO Right 09/25/2016   Procedure: CATARACT EXTRACTION PHACO AND INTRAOCULAR LENS PLACEMENT (IOC);  Surgeon: Estill Cotta, MD;  Location: [facility];  Service: Ophthalmology;  Laterality: Right;  [place] 01:58 AP% 18.1 CDE 43.36 Fluid pack # [id number]  . CYSTOSCOPY/URETEROSCOPY/HOLMIUM LASER/STENT PLACEMENT Right 04/13/2018   Procedure: CYSTOSCOPY/URETEROSCOPY/HOLMIUM LASER/STENT PLACEMENT;  Surgeon: Hollice Espy, MD;  Location: [facility];  Service: Urology;  Laterality: Right;  . ELECTROPHYSIOLOGIC STUDY N/A 12/22/2014   Procedure: CARDIOVERSION;  Surgeon: Teodoro Spray, MD;  Location: [facility];  Service: Cardiovascular;  Laterality: N/A;  . FINGER SURGERY Right 1950   index finger cut off half way  . HERNIA REPAIR Bilateral    inguinal repaired x 4  . INSERT / REPLACE / REMOVE PACEMAKER     12/17  . JOINT REPLACEMENT Right 2012   partial knee replacement  . PACEMAKER INSERTION Left 07/17/2016   Procedure: INSERTION PACEMAKER;  Surgeon: Isaias Cowman, MD;  Location: [facility];  Service: Cardiovascular;  Laterality: Left;  . TONSILLECTOMY  1950  . TOTAL KNEE ARTHROPLASTY Left 09/30/2017   Procedure: TOTAL KNEE ARTHROPLASTY;  Surgeon: Hessie Knows, MD;  Location: [facility];  Service: Orthopedics;  Laterality: Left;  . TRANSURETHRAL RESECTION OF PROSTATE  Home Medications:  Allergies as of 09/06/2019      Reactions   Mold Extract [trichophyton] Swelling, Other (See Comments)   Tightness in throat   Dog Epithelium Swelling, Other (See Comments)   Also, cats too He is near the animals and does okay but allergy testing indicates that he   Is allergic to animals   Other Swelling   Allergic to a variety of trees that surround his home. Allergy testing indicated that  this is an allergy. Took allergy  shots for many years. Stopped 1 yr ago   Tree Extract Swelling   Allergic to a variety of trees that surround his home. Allergy testing indicated that  this is an allergy.  Took allergy shots for many years. Stopped 1 yr ago   Codeine Nausea And Vomiting   Sulfa Antibiotics Nausea And Vomiting      Medication List       Accurate as of September 06, 2019  1:08 PM. If you have any questions, ask your nurse or doctor.        acetaminophen 650 MG CR tablet Commonly known as: TYLENOL Take 1,300 mg by mouth every 8 (eight) hours as needed for pain.   amitriptyline 25 MG tablet Commonly known as: ELAVIL Take 1 tablet (25 mg total) by mouth at bedtime.   amLODipine 5 MG tablet Commonly known as: NORVASC Take 1 tablet (5 mg total) by mouth daily.   aspirin EC 81 MG tablet Take 81 mg by mouth daily.   Cholecalciferol 50 MCG (2000 UT) Tabs Take by mouth.   cyanocobalamin 1000 MCG tablet Take by mouth.   diclofenac Sodium 1 % Gel Commonly known as: VOLTAREN Apply topically.   Eliquis 5 MG Tabs tablet Generic drug: apixaban SMARTSIG:1 Tablet(s) By Mouth Every 12 Hours   finasteride 5 MG tablet Commonly known as: PROSCAR Take 1 tablet (5 mg total) by mouth daily.   FISH OIL PO Take 1 capsule by mouth 2 (two) times daily.   flecainide 100 MG tablet Commonly known as: TAMBOCOR Take 100 mg by mouth 2 (two) times daily.   losartan 100 MG tablet Commonly known as: COZAAR Take 100 mg by mouth at bedtime.   memantine 10 MG tablet Commonly known as: NAMENDA Take by mouth.   metoprolol succinate 25 MG 24 hr tablet Commonly known as: TOPROL-XL   metoprolol tartrate 25 MG tablet Commonly known as: LOPRESSOR Take 25 mg by mouth 2 (two) times daily.   omeprazole 20 MG capsule Commonly known as: PRILOSEC 2 (two) times daily before a meal.   oxybutynin 5 MG tablet Commonly known as: DITROPAN Take 1 tablet (5 mg total) by mouth every 8 (eight) hours as needed for bladder  spasms.   sildenafil 20 MG tablet Commonly known as: REVATIO Take 20 mg by mouth.   tadalafil 20 MG tablet Commonly known as: CIALIS Take by mouth.   tamsulosin 0.4 MG Caps capsule Commonly known as: Flomax Take 1 capsule (0.4 mg total) by mouth daily.       Allergies:  Allergies  Allergen Reactions  . Mold Extract [Trichophyton] Swelling and Other (See Comments)    Tightness in throat  . Dog Epithelium Swelling and Other (See Comments)    Also, cats too He is near the animals and does okay but allergy testing indicates that he   Is allergic to animals    . Other Swelling    Allergic to a variety of trees that surround his home. Allergy  testing indicated that  this is an allergy. Took allergy shots for many years. Stopped 1 yr ago  . Tree Extract Swelling    Allergic to a variety of trees that surround his home. Allergy testing indicated that  this is an allergy.  Took allergy shots for many years. Stopped 1 yr ago  . Codeine Nausea And Vomiting  . Sulfa Antibiotics Nausea And Vomiting    Family History: No family history on file.  Social History:   reports that he quit smoking about 53 years ago. His smoking use included cigarettes. He smoked 2.00 packs per day. He has never used smokeless tobacco. He reports that he does not drink alcohol or use drugs.  ROS: UROLOGY Frequent Urination?: Yes Hard to postpone urination?: No Burning/pain with urination?: No Get up at night to urinate?: Yes Leakage of urine?: No Urine stream starts and stops?: No Trouble starting stream?: No Do you have to strain to urinate?: No Blood in urine?: Yes Urinary tract infection?: No Sexually transmitted disease?: No Injury to kidneys or bladder?: No Painful intercourse?: No Weak stream?: Yes Erection problems?: No Penile pain?: No  Gastrointestinal Nausea?: Yes Vomiting?: Yes Indigestion/heartburn?: No Diarrhea?: Yes Constipation?: No  Constitutional Fever: No Night  sweats?: No Weight loss?: No Fatigue?: No  Skin Skin rash/lesions?: Yes Itching?: Yes  Eyes Blurred vision?: No Double vision?: No  Ears/Nose/Throat Sore throat?: No Sinus problems?: No  Hematologic/Lymphatic Swollen glands?: No Easy bruising?: Yes  Cardiovascular Leg swelling?: No Chest pain?: No  Respiratory Cough?: No Shortness of breath?: No  Endocrine Excessive thirst?: No  Musculoskeletal Back pain?: No Joint pain?: No  Neurological Headaches?: No Dizziness?: Yes  Psychologic Depression?: No Anxiety?: No  Physical Exam: BP 114/69   Pulse 71   Ht 5\' 11"  (1.803 m)   Wt 246 lb (111.6 kg)   BMI 34.31 kg/m   Constitutional:  Alert and oriented, no acute distress, nontoxic appearing HEENT: [place], AT Cardiovascular: No clubbing, cyanosis, or edema Respiratory: Normal respiratory effort, no increased work of breathing Skin: No rashes, bruises or suspicious lesions Neurologic: Grossly intact, no focal deficits, moving all 4 extremities Psychiatric: Normal mood and affect  Laboratory Data: Results for orders placed or performed in visit on 09/06/19  Microscopic Examination   URINE  Result Value Ref Range   WBC, UA 0-5 0 - 5 /hpf   RBC >30 (A) 0 - 2 /hpf   Epithelial Cells (non renal) 0-10 0 - 10 /hpf   Bacteria, UA None seen None seen/Few  Urinalysis, Complete  Result Value Ref Range   Specific Gravity, UA <1.005 (L) 1.005 - 1.030   pH, UA 8.5 (H) 5.0 - 7.5   Color, UA Red (A) Yellow   Appearance Ur Cloudy (A) Clear   Protein,UA CANCELED    Glucose, UA CANCELED    Ketones, UA CANCELED    Microscopic Examination See below:   CBC  Result Value Ref Range   WBC 11.8 (H) 3.4 - 10.8 x10E3/uL   RBC 4.71 4.14 - 5.80 x10E6/uL   Hemoglobin 14.1 13.0 - 17.7 g/dL   Hematocrit 41.3 37.5 - 51.0 %   MCV 88 79 - 97 fL   MCH 29.9 26.6 - 33.0 pg   MCHC 34.1 31.5 - 35.7 g/dL   RDW 12.2 11.6 - 15.4 %   Platelets 182 150 - 450 x10E3/uL     Pertinent  Imaging: CT hematuria, 09/06/2019: CLINICAL DATA:  Gross hematuria and right flank pain  EXAM: CT  ABDOMEN AND PELVIS WITHOUT AND WITH CONTRAST  TECHNIQUE: Multidetector CT imaging of the abdomen and pelvis was performed following the standard protocol before and following the bolus administration of intravenous contrast.  CONTRAST:  176mL OMNIPAQUE IOHEXOL 300 MG/ML  SOLN  COMPARISON:  03/05/2019 CT abdomen  FINDINGS: Lower chest: Stable pleural calcifications along the hemidiaphragms. Pacer leads noted. Mild cardiomegaly. Right coronary artery and descending thoracic aortic atherosclerotic calcification.  Hepatobiliary: 5 mm hypodense lesion in segment 4a of the liver on image 11/7 is technically too small to characterize although statistically likely to be benign, and unchanged from the prior exam. Gallbladder unremarkable.  Pancreas: Unremarkable  Spleen: Unremarkable  Adrenals/Urinary Tract: Both adrenal glands appear normal. Bilateral simple appearing renal cysts are present. Some of the hypodense renal lesions are technically too small to characterize, although statistically likely to be cysts.  Marked bladder wall thickening anteriorly and along the right side of the urinary bladder extending from the upper margin of the urinary bladder down towards the inferior margin. This tissue is enhancing and suspicious for a sessile bladder mass given the asymmetry. Stranding in the adjacent space of [place].  No filling defect is identified along the remainder of the urothelium. No urinary tract calculi are identified.  Stomach/Bowel: There is some scattered air-fluid levels in nondilated loops of proximal small bowel. Multifocal low-grade mesenteric edema along small segments of small bowel, for example on image 69/10 and along a separately on image 83/10.  Vascular/Lymphatic: Aortoiliac atherosclerotic vascular disease. Fatty inguinal lymph nodes are once  again observed. No solid pathologic adenopathy identified.  Reproductive: Suspected prior TURP. Mild prostatomegaly.  Other: No supplemental non-categorized findings.  Musculoskeletal: Bilateral inguinal hernia repairs in the past. Rim sclerotic lucent lesion along the iliac side of the lower margin of the right SI joint, probably secondary to arthropathy. Lumbar spondylosis, degenerative disc disease, and congenitally short pedicles causing multilevel impingement in the lumbar spine. Grade 1 degenerative anterolisthesis at L4-5 with loss of disc height and disc desiccation at this level.  IMPRESSION: 1. 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 Cystoscopy recommended. 2. Multifocal low-grade mesenteric edema along segments of small bowel. This is nonspecific but could be due to low-grade enteritis. 3. Other imaging findings of potential clinical significance: Stable pleural calcifications along the hemidiaphragms. Mild cardiomegaly. Coronary atherosclerosis. Bilateral renal cysts. Multilevel lumbar impingement due to spondylosis, degenerative disc disease, and congenitally short pedicles. Rim sclerotic lucent lesion along the iliac side of the right SI joint, probably secondary to arthropathy. Bilateral inguinal hernia repairs in the past.   Electronically Signed   By: Van Clines M.D.   On: 09/06/2019 15:48  I personally reviewed the images referenced above and and note asymmetric, enhancing bladder wall thickening with intact bilateral renal cysts.  Assessment & Plan:   1. Gross hematuria 84 year old male with PMH nephrolithiasis, BPH with LUTS, dysuria, and penile pain here with a 1 day history of sudden onset right flank pain that has since resolved and has been associated with significant gross hematuria.  UA reassuring for infection.  Based on his  clinical picture, I suspected rupture of his known right renal cyst.  I ordered a CBC and stat CT urogram for further evaluation today.  CT findings significant for intact bilateral renal cysts as well as asymmetric and enhancing bladder wall thickening, suspicious for sessile bladder mass/urothelial tumor.  Radiology recommends cystoscopy for further evaluation.  I  contacted the patient via telephone to report the results of his CT scan.  I have scheduled him for cystoscopy with Dr. Bernardo Heater on 09/09/2019.  I counseled him to come to our office with a full bladder and to push fluids in the interim. I counseled the patient on signs and symptoms of gross hematuria requiring urgent evaluation, including thick, ketchup-like urinary output; passage of large clots; dark, maroon-colored urine; lower abdominal pain; lumbar pain; abdominal distention; and the inability to urinate.  I advised him to contact the office for assistance if he develops these symptoms during routine office hours, 8 AM to 5 PM Monday through Friday.  If outside those hours, I advised him to proceed to the emergency department. He expressed understanding.   - Urinalysis, Complete - CT HEMATURIA WORKUP - CBC   Return if symptoms worsen or fail to improve.  Debroah Loop, PA-C  [facility] [address] [place], [place] [address] [phone]

## 2019-09-07 LAB — CBC
Hematocrit: 41.3 % (ref 37.5–51.0)
Hemoglobin: 14.1 g/dL (ref 13.0–17.7)
MCH: 29.9 pg (ref 26.6–33.0)
MCHC: 34.1 g/dL (ref 31.5–35.7)
MCV: 88 fL (ref 79–97)
Platelets: 182 10*3/uL (ref 150–450)
RBC: 4.71 x10E6/uL (ref 4.14–5.80)
RDW: 12.2 % (ref 11.6–15.4)
WBC: 11.8 10*3/uL — ABNORMAL HIGH (ref 3.4–10.8)

## 2019-09-09 ENCOUNTER — Encounter: Payer: Self-pay | Admitting: Urology

## 2019-09-09 ENCOUNTER — Ambulatory Visit (INDEPENDENT_AMBULATORY_CARE_PROVIDER_SITE_OTHER): Payer: Medicare PPO | Admitting: Urology

## 2019-09-09 ENCOUNTER — Other Ambulatory Visit: Payer: Self-pay

## 2019-09-09 VITALS — BP 138/74 | HR 90 | Ht 72.0 in | Wt 246.0 lb

## 2019-09-09 DIAGNOSIS — R310 Gross hematuria: Secondary | ICD-10-CM | POA: Diagnosis not present

## 2019-09-09 DIAGNOSIS — N3289 Other specified disorders of bladder: Secondary | ICD-10-CM

## 2019-09-09 NOTE — Progress Notes (Signed)
   09/09/19  CC:  Chief Complaint  Patient presents with  . Cysto    HPI: 84 y.o. male followed for chronic dysuria and penile pain.  Prior CT July 2020 was unremarkable and cystoscopy showed prostate enlargement with hypervascularity.  He was started on finasteride and had a follow-up visit in early December 2020 he had seen no change in his symptoms after 3 months of the medication.  He was placed on low-dose amitriptyline which was titrated to 25 mg.  He had a follow-up visit with Larene [facility] in mid January without significant change in symptoms and conservative management was continued.  He was seen in early February 21 with gross hematuria associated with a right flank pain.  CTU was obtained which showed bilateral simple cysts without solid masses, calculi or hydronephrosis.  He was noted to have marked bladder wall thickening anteriorly and the right bladder wall which was not present on prior CT of July.  He presents today for cystoscopy.  He had recurrent hematuria last night.  CT was personally reviewed.  Volume calculation based on CT measurements was 71 g.  Blood pressure 138/74, pulse 90, height 6' (1.829 m), weight 246 lb (111.6 kg). NED. A&Ox3.   No respiratory distress   Abd soft, NT, ND Normal phallus with bilateral descended testicles  Cystoscopy Procedure Note  Patient identification was confirmed, informed consent was obtained, and patient was prepped using Betadine solution.  Lidocaine jelly was administered per urethral meatus.     Pre-Procedure: - Inspection reveals a normal caliber urethral meatus.  Procedure: The flexible cystoscope was introduced without difficulty - No urethral strictures/lesions are present. - TUR changes with left lateral lobe crossing midline and occluding outlet.  Prominent hypervascularity/friability  - Moderate elevation bladder neck -Visualization of the bladder mucosa was suboptimal secondary to hematuria.  Urine was aspirated from the  scope however the mucosa could not be adequately assessed  Post-Procedure: - Patient tolerated the procedure well  Assessment/ Plan: -Gross hematuria with abnormal bladder findings on CT which could not be adequately assessed on today's cystoscopy secondary to suboptimal visualization  -Schedule cystoscopy under anesthesia with possible TURBT.  The procedure was discussed in detail: Potential risks of bleeding, infection, bladder injury/perforation as well as anesthetic risks.  He indicated all questions were answered and desires to proceed.  -Cardiology clearance for anesthesia and to hold Eliquis   Abbie Sons, MD

## 2019-09-10 ENCOUNTER — Encounter: Payer: Self-pay | Admitting: Urology

## 2019-09-10 LAB — URINALYSIS, COMPLETE
Bilirubin, UA: NEGATIVE
Nitrite, UA: NEGATIVE
Specific Gravity, UA: 1.015 (ref 1.005–1.030)
Urobilinogen, Ur: 4 mg/dL — ABNORMAL HIGH (ref 0.2–1.0)
pH, UA: 5 (ref 5.0–7.5)

## 2019-09-10 LAB — MICROSCOPIC EXAMINATION: RBC: >30 /hpf — AB (ref 0–2)

## 2019-09-13 ENCOUNTER — Telehealth: Payer: Self-pay | Admitting: Radiology

## 2019-09-13 NOTE — Telephone Encounter (Signed)
Patient would like to schedule surgery. Please advise

## 2019-09-16 ENCOUNTER — Other Ambulatory Visit: Payer: Self-pay | Admitting: Radiology

## 2019-09-16 DIAGNOSIS — N3289 Other specified disorders of bladder: Secondary | ICD-10-CM

## 2019-09-16 DIAGNOSIS — R310 Gross hematuria: Secondary | ICD-10-CM

## 2019-09-17 ENCOUNTER — Other Ambulatory Visit: Payer: Self-pay | Admitting: Family Medicine

## 2019-09-17 DIAGNOSIS — R310 Gross hematuria: Secondary | ICD-10-CM

## 2019-09-17 DIAGNOSIS — N3289 Other specified disorders of bladder: Secondary | ICD-10-CM

## 2019-09-20 ENCOUNTER — Other Ambulatory Visit: Payer: Medicare PPO

## 2019-09-20 ENCOUNTER — Other Ambulatory Visit: Payer: Self-pay

## 2019-09-20 DIAGNOSIS — R310 Gross hematuria: Secondary | ICD-10-CM

## 2019-09-20 DIAGNOSIS — N3289 Other specified disorders of bladder: Secondary | ICD-10-CM

## 2019-09-20 LAB — URINALYSIS, COMPLETE
Bilirubin, UA: NEGATIVE
Glucose, UA: NEGATIVE
Ketones, UA: NEGATIVE
Leukocytes,UA: NEGATIVE
Nitrite, UA: NEGATIVE
Specific Gravity, UA: 1.025 (ref 1.005–1.030)
Urobilinogen, Ur: 1 mg/dL (ref 0.2–1.0)
pH, UA: 6.5 (ref 5.0–7.5)

## 2019-09-20 LAB — MICROSCOPIC EXAMINATION
Bacteria, UA: NONE SEEN
RBC, Urine: >30 /hpf — AB (ref 0–2)

## 2019-09-22 ENCOUNTER — Encounter
Admission: RE | Admit: 2019-09-22 | Discharge: 2019-09-22 | Disposition: A | Discharge status: Home / Self Care | Payer: Medicare PPO | Source: Ambulatory Visit | Attending: Urology | Admitting: Urology

## 2019-09-22 ENCOUNTER — Ambulatory Visit: Payer: Self-pay | Admitting: Urology

## 2019-09-22 ENCOUNTER — Other Ambulatory Visit: Payer: Medicare PPO

## 2019-09-22 ENCOUNTER — Other Ambulatory Visit: Payer: Self-pay

## 2019-09-22 HISTORY — DX: Pneumonia, unspecified organism: J18.9

## 2019-09-22 LAB — CULTURE, URINE COMPREHENSIVE

## 2019-09-22 NOTE — Patient Instructions (Addendum)
Your procedure is scheduled on: Tuesday, February 23 Report to Day Surgery on the 2nd floor of the [facility]. To find out your arrival time, please call [phone] between 1PM - 3PM on: Monday, February 22  REMEMBER: Instructions that are not followed completely may result in serious medical risk, up to and including death; or upon the discretion of your surgeon and anesthesiologist your surgery may need to be rescheduled.  Do not eat food after midnight the night before surgery.  No gum chewing, lozengers or hard candies.  You may however, drink CLEAR liquids up to 2 hours before you are scheduled to arrive for your surgery. Do not drink anything within 2 hours of the start of your surgery.  Clear liquids include: - water  - apple juice without pulp - gatorade - black coffee or tea (Do NOT add milk or creamers to the coffee or tea) Do NOT drink anything that is not on this list.  No Alcohol for 24 hours before or after surgery.  No Smoking including e-cigarettes for 24 hours prior to surgery.  No chewable tobacco products for at least 6 hours prior to surgery.  No nicotine patches on the day of surgery.  On the morning of surgery brush your teeth with toothpaste and water, you may rinse your mouth with mouthwash if you wish. Do not swallow any toothpaste or mouthwash.  Notify your doctor if there is any change in your medical condition (cold, fever, infection).  Do not wear jewelry, make-up, hairpins, clips or nail polish.  Do not wear lotions, powders, or perfumes.   Do not shave 48 hours prior to surgery.   Contacts and dentures may not be worn into surgery.  Do not bring valuables to the hospital, including drivers license, insurance or credit cards.  [facility] is not responsible for any belongings or valuables.   TAKE THESE MEDICATIONS THE MORNING OF SURGERY:  1.  Amlodipine 2.  Flecainide 3.  Metoprolol 4.  Omeprazole  Follow recommendations from  Cardiologist, Pulmonologist or PCP regarding stopping Aspirin, Coumadin, Plavix, Eliquis, Pradaxa, or Pletal.                                            HOLD ASPIRIN FOR 7 DAYS AND HOLD ELIQUIS FOR 3 DAYS PRIOR TO SURGERY  Stop Anti-inflammatories (NSAIDS) such as Advil, Aleve, Ibuprofen, Motrin, Naproxen, Naprosyn and Aspirin based products such as Excedrin, Goodys Powder, BC Powder. (May take Tylenol or Acetaminophen if needed.)  Stop ANY OVER THE COUNTER supplements until after surgery. STOP FISH OIL (May continue Vitamin D, Vitamin B, and multivitamin.)  If you are being discharged the day of surgery, you will not be allowed to drive home. You will need a responsible adult to drive you home and stay with you that night.   Please call [phone] if you have any questions about these instructions.

## 2019-09-24 ENCOUNTER — Other Ambulatory Visit
Admission: RE | Admit: 2019-09-24 | Discharge: 2019-09-24 | Disposition: A | Discharge status: Home / Self Care | Payer: Medicare PPO | Source: Ambulatory Visit | Attending: Urology | Admitting: Urology

## 2019-09-24 DIAGNOSIS — Z01812 Encounter for preprocedural laboratory examination: Secondary | ICD-10-CM | POA: Diagnosis present

## 2019-09-24 DIAGNOSIS — Z20822 Contact with and (suspected) exposure to covid-19: Secondary | ICD-10-CM | POA: Diagnosis not present

## 2019-09-24 LAB — SARS CORONAVIRUS 2 (TAT 6-24 HRS): SARS Coronavirus 2: NEGATIVE

## 2019-09-27 MED ORDER — SODIUM CHLORIDE 0.9 % IV SOLN
1.0000 g | INTRAVENOUS | Status: AC
  Administered 2019-09-28: 1 g via INTRAVENOUS
  Filled 2019-09-27: qty 1

## 2019-09-28 ENCOUNTER — Other Ambulatory Visit: Payer: Self-pay

## 2019-09-28 ENCOUNTER — Ambulatory Visit: Payer: Medicare PPO

## 2019-09-28 ENCOUNTER — Encounter: Payer: Self-pay | Admitting: Urology

## 2019-09-28 ENCOUNTER — Encounter
Admission: RE | Disposition: A | Discharge status: Home / Self Care | Payer: Self-pay | Source: Home / Self Care | Attending: Urology

## 2019-09-28 ENCOUNTER — Ambulatory Visit
Admission: RE | Admit: 2019-09-28 | Discharge: 2019-09-28 | Disposition: A | Discharge status: Home / Self Care | Payer: Medicare PPO | Attending: Urology | Admitting: Urology

## 2019-09-28 DIAGNOSIS — Z7901 Long term (current) use of anticoagulants: Secondary | ICD-10-CM | POA: Diagnosis not present

## 2019-09-28 DIAGNOSIS — N183 Chronic kidney disease, stage 3 unspecified: Secondary | ICD-10-CM | POA: Insufficient documentation

## 2019-09-28 DIAGNOSIS — H9193 Unspecified hearing loss, bilateral: Secondary | ICD-10-CM | POA: Diagnosis not present

## 2019-09-28 DIAGNOSIS — Z885 Allergy status to narcotic agent status: Secondary | ICD-10-CM | POA: Diagnosis not present

## 2019-09-28 DIAGNOSIS — Z7982 Long term (current) use of aspirin: Secondary | ICD-10-CM | POA: Diagnosis not present

## 2019-09-28 DIAGNOSIS — Z881 Allergy status to other antibiotic agents status: Secondary | ICD-10-CM | POA: Diagnosis not present

## 2019-09-28 DIAGNOSIS — Z79899 Other long term (current) drug therapy: Secondary | ICD-10-CM | POA: Insufficient documentation

## 2019-09-28 DIAGNOSIS — Z87442 Personal history of urinary calculi: Secondary | ICD-10-CM | POA: Diagnosis not present

## 2019-09-28 DIAGNOSIS — I4891 Unspecified atrial fibrillation: Secondary | ICD-10-CM | POA: Insufficient documentation

## 2019-09-28 DIAGNOSIS — N401 Enlarged prostate with lower urinary tract symptoms: Secondary | ICD-10-CM | POA: Insufficient documentation

## 2019-09-28 DIAGNOSIS — K219 Gastro-esophageal reflux disease without esophagitis: Secondary | ICD-10-CM | POA: Diagnosis not present

## 2019-09-28 DIAGNOSIS — M170 Bilateral primary osteoarthritis of knee: Secondary | ICD-10-CM | POA: Diagnosis not present

## 2019-09-28 DIAGNOSIS — Z882 Allergy status to sulfonamides status: Secondary | ICD-10-CM | POA: Insufficient documentation

## 2019-09-28 DIAGNOSIS — Z87891 Personal history of nicotine dependence: Secondary | ICD-10-CM | POA: Diagnosis not present

## 2019-09-28 DIAGNOSIS — Z950 Presence of cardiac pacemaker: Secondary | ICD-10-CM | POA: Insufficient documentation

## 2019-09-28 DIAGNOSIS — R7303 Prediabetes: Secondary | ICD-10-CM | POA: Insufficient documentation

## 2019-09-28 DIAGNOSIS — J449 Chronic obstructive pulmonary disease, unspecified: Secondary | ICD-10-CM | POA: Insufficient documentation

## 2019-09-28 DIAGNOSIS — D494 Neoplasm of unspecified behavior of bladder: Secondary | ICD-10-CM | POA: Diagnosis not present

## 2019-09-28 DIAGNOSIS — C679 Malignant neoplasm of bladder, unspecified: Secondary | ICD-10-CM | POA: Diagnosis not present

## 2019-09-28 DIAGNOSIS — Z96653 Presence of artificial knee joint, bilateral: Secondary | ICD-10-CM | POA: Insufficient documentation

## 2019-09-28 DIAGNOSIS — N3289 Other specified disorders of bladder: Secondary | ICD-10-CM

## 2019-09-28 DIAGNOSIS — G2581 Restless legs syndrome: Secondary | ICD-10-CM | POA: Diagnosis not present

## 2019-09-28 DIAGNOSIS — I2510 Atherosclerotic heart disease of native coronary artery without angina pectoris: Secondary | ICD-10-CM | POA: Diagnosis not present

## 2019-09-28 DIAGNOSIS — I129 Hypertensive chronic kidney disease with stage 1 through stage 4 chronic kidney disease, or unspecified chronic kidney disease: Secondary | ICD-10-CM | POA: Insufficient documentation

## 2019-09-28 DIAGNOSIS — R310 Gross hematuria: Secondary | ICD-10-CM | POA: Insufficient documentation

## 2019-09-28 HISTORY — PX: TRANSURETHRAL RESECTION OF BLADDER TUMOR: SHX2575

## 2019-09-28 HISTORY — PX: CYSTOSCOPY: SHX5120

## 2019-09-28 SURGERY — TURBT (TRANSURETHRAL RESECTION OF BLADDER TUMOR)
Anesthesia: General | Site: Bladder

## 2019-09-28 MED ORDER — FENTANYL CITRATE (PF) 100 MCG/2ML IJ SOLN
INTRAMUSCULAR | Status: AC
  Filled 2019-09-28: qty 2

## 2019-09-28 MED ORDER — FENTANYL CITRATE (PF) 100 MCG/2ML IJ SOLN
25.0000 ug | INTRAMUSCULAR | Status: DC | PRN
  Administered 2019-09-28: 50 ug via INTRAVENOUS

## 2019-09-28 MED ORDER — FENTANYL CITRATE (PF) 100 MCG/2ML IJ SOLN
INTRAMUSCULAR | Status: DC | PRN
  Administered 2019-09-28 (×3): 25 ug via INTRAVENOUS

## 2019-09-28 MED ORDER — FENTANYL CITRATE (PF) 100 MCG/2ML IJ SOLN
INTRAMUSCULAR | Status: AC
  Administered 2019-09-28: 50 ug via INTRAVENOUS
  Filled 2019-09-28: qty 2

## 2019-09-28 MED ORDER — PROPOFOL 10 MG/ML IV BOLUS
INTRAVENOUS | Status: AC
  Filled 2019-09-28: qty 20

## 2019-09-28 MED ORDER — ACETAMINOPHEN 10 MG/ML IV SOLN
INTRAVENOUS | Status: DC | PRN
  Administered 2019-09-28: 1000 mg via INTRAVENOUS

## 2019-09-28 MED ORDER — ACETAMINOPHEN 10 MG/ML IV SOLN
INTRAVENOUS | Status: AC
  Filled 2019-09-28: qty 100

## 2019-09-28 MED ORDER — LIDOCAINE HCL (CARDIAC) PF 100 MG/5ML IV SOSY
PREFILLED_SYRINGE | INTRAVENOUS | Status: DC | PRN
  Administered 2019-09-28: 50 mg via INTRAVENOUS

## 2019-09-28 MED ORDER — PHENYLEPHRINE HCL (PRESSORS) 10 MG/ML IV SOLN
INTRAVENOUS | Status: DC | PRN
  Administered 2019-09-28: 200 ug via INTRAVENOUS
  Administered 2019-09-28 (×6): 100 ug via INTRAVENOUS

## 2019-09-28 MED ORDER — ONDANSETRON HCL 4 MG/2ML IJ SOLN
INTRAMUSCULAR | Status: DC | PRN
  Administered 2019-09-28: 4 mg via INTRAVENOUS

## 2019-09-28 MED ORDER — PROPOFOL 10 MG/ML IV BOLUS
INTRAVENOUS | Status: DC | PRN
  Administered 2019-09-28: 100 mg via INTRAVENOUS
  Administered 2019-09-28: 150 mg via INTRAVENOUS

## 2019-09-28 MED ORDER — LACTATED RINGERS IV SOLN
INTRAVENOUS | Status: DC
  Administered 2019-09-28: 75 mL/h via INTRAVENOUS

## 2019-09-28 MED ORDER — LIDOCAINE HCL (PF) 2 % IJ SOLN
INTRAMUSCULAR | Status: AC
  Filled 2019-09-28: qty 5

## 2019-09-28 MED ORDER — DEXAMETHASONE SODIUM PHOSPHATE 10 MG/ML IJ SOLN
INTRAMUSCULAR | Status: DC | PRN
  Administered 2019-09-28: 5 mg via INTRAVENOUS

## 2019-09-28 MED ORDER — ONDANSETRON HCL 4 MG/2ML IJ SOLN: 4.0000 mg | Freq: Once | INTRAMUSCULAR | Status: DC | PRN

## 2019-09-28 MED ORDER — ROCURONIUM BROMIDE 50 MG/5ML IV SOLN
INTRAVENOUS | Status: AC
  Filled 2019-09-28: qty 1

## 2019-09-28 MED ORDER — HYDROCODONE-ACETAMINOPHEN 5-325 MG PO TABS: 1.0000 | ORAL_TABLET | Freq: Four times a day (QID) | ORAL | 0 refills | Status: DC | PRN

## 2019-09-28 SURGICAL SUPPLY — 33 items
BAG DRAIN CYSTO-URO LG1000N (MISCELLANEOUS) ×3 IMPLANT
BAG URINE DRAIN 2000ML AR STRL (UROLOGICAL SUPPLIES) ×3 IMPLANT
BAG URO DRAIN 4000ML (MISCELLANEOUS) ×2 IMPLANT
CATH FOL 2WAY LX 20X30 (CATHETERS) ×2 IMPLANT
CATH FOLEY 2WAY 18X30 (CATHETERS) IMPLANT
CATH FOLEY 2WAY SIL 18X30 (CATHETERS)
COVER WAND RF STERILE (DRAPES) ×3 IMPLANT
DRAPE UTILITY 15X26 TOWEL STRL (DRAPES) ×3 IMPLANT
DRSG TELFA 4X3 1S NADH ST (GAUZE/BANDAGES/DRESSINGS) ×3 IMPLANT
ELECT BIVAP BIPO 22/24 DONUT (ELECTROSURGICAL) ×3
ELECT LOOP 22F BIPOLAR SML (ELECTROSURGICAL)
ELECT REM PT RETURN 9FT ADLT (ELECTROSURGICAL)
ELECTRD BIVAP BIPO 22/24 DONUT (ELECTROSURGICAL) IMPLANT
ELECTRODE LOOP 22F BIPOLAR SML (ELECTROSURGICAL) IMPLANT
ELECTRODE REM PT RTRN 9FT ADLT (ELECTROSURGICAL) IMPLANT
GLOVE BIO SURGEON STRL SZ8 (GLOVE) ×3 IMPLANT
GLOVE BIOGEL M 8.0 STRL (GLOVE) ×3 IMPLANT
GOWN STRL REUS W/ TWL LRG LVL3 (GOWN DISPOSABLE) ×1 IMPLANT
GOWN STRL REUS W/ TWL XL LVL3 (GOWN DISPOSABLE) ×1 IMPLANT
GOWN STRL REUS W/TWL LRG LVL3 (GOWN DISPOSABLE) ×2
GOWN STRL REUS W/TWL XL LVL3 (GOWN DISPOSABLE) ×2
GOWN STRL REUS W/TWL XL LVL4 (GOWN DISPOSABLE) ×3 IMPLANT
IV NS IRRIG 3000ML ARTHROMATIC (IV SOLUTION) ×12 IMPLANT
KIT TURNOVER CYSTO (KITS) ×3 IMPLANT
LOOP CUT BIPOLAR 24F LRG (ELECTROSURGICAL) ×2 IMPLANT
PACK CYSTO AR (MISCELLANEOUS) ×3 IMPLANT
SET CYSTO W/LG BORE CLAMP LF (SET/KITS/TRAYS/PACK) ×3 IMPLANT
SET IRRIG Y TYPE TUR BLADDER L (SET/KITS/TRAYS/PACK) ×3 IMPLANT
SURGILUBE 2OZ TUBE FLIPTOP (MISCELLANEOUS) ×3 IMPLANT
SYR 10ML LL (SYRINGE) ×3 IMPLANT
SYRINGE IRR TOOMEY STRL 70CC (SYRINGE) ×3 IMPLANT
WATER STERILE IRR 1000ML POUR (IV SOLUTION) ×3 IMPLANT
WATER STERILE IRR 3000ML UROMA (IV SOLUTION) ×3 IMPLANT

## 2019-09-28 NOTE — Interval H&P Note (Signed)
History and Physical Interval Note: Office cystoscopy was suboptimal.  He presents for cystoscopy under anesthesia and possible TURBT. Lungs: Clear CV: RRR  09/28/2019 11:18 AM  Joseph Houston  has presented today for surgery, with the diagnosis of hematuria, bladder mass.  The various methods of treatment have been discussed with the patient and family. After consideration of risks, benefits and other options for treatment, the patient has consented to  Procedure(s): TRANSURETHRAL RESECTION OF BLADDER TUMOR (TURBT) (N/A) CYSTOSCOPY (N/A) as a surgical intervention.  The patient's history has been reviewed, patient examined, no change in status, stable for surgery.  I have reviewed the patient's chart and labs.  Questions were answered to the patient's satisfaction.     [place]

## 2019-09-28 NOTE — Transfer of Care (Signed)
Immediate Anesthesia Transfer of Care Note  Patient: Billie Luebbers Balboni  Procedure(s) Performed: TRANSURETHRAL RESECTION OF BLADDER TUMOR (TURBT) (N/A Bladder) CYSTOSCOPY (N/A Bladder)  Patient Location: PACU  Anesthesia Type:General  Level of Consciousness: drowsy and patient cooperative  Airway & Oxygen Therapy: Patient Spontanous Breathing and Patient connected to face mask oxygen  Post-op Assessment: Report given to RN and Post -op Vital signs reviewed and stable  Post vital signs: Reviewed and stable  Last Vitals:  Vitals Value Taken Time  BP 109/69 09/28/19 1243  Temp 36.2 C 09/28/19 1243  Pulse 57 09/28/19 1245  Resp 15 09/28/19 1245  SpO2 83 % 09/28/19 1245  Vitals shown include unvalidated device data.  Last Pain:  Vitals:   09/28/19 1243  TempSrc:   PainSc: Asleep         Complications: No apparent anesthesia complications

## 2019-09-28 NOTE — Op Note (Signed)
Preoperative diagnosis: 1. Bladder tumor (>5 cm)  Postoperative diagnosis:  1. Bladder tumor (>5 cm)  Procedure:  1. Cystoscopy 2. Transurethral resection of bladder tumor (large)   Surgeon: Nicki Reaper C. Chinonso Linker, M.D.  Anesthesia: General  Complications: None  Intraoperative findings:  1. Urethra normal in caliber without stricture 2. TUR changes with regrowth left lateral lobe proximally 3. Abnormal appearing bladder mucosa encompassing the postero-lateral aspect of the bladder from the mid right lateral wall extending across the dome to the mid left lateral wall.  This was primarily edematous appearing bladder mucosa however at the dome was a nodular tumor.  Ureteral procedures/trigone normal in appearance  EBL: Minimal  Specimens: 1. Nodular bladder tumor 2. Base bladder tumor 3. Edematous bladder mucosa      Indication: Joseph Houston is a male with intermittent gross hematuria.  Office cystoscopy was suboptimal due to inadequate visualization.  After reviewing the management options for treatment, he elected to proceed with the above surgical procedure(s). We have discussed the potential benefits and risks of the procedure, side effects of the proposed treatment, the likelihood of the patient achieving the goals of the procedure, and any potential problems that might occur during the procedure or recuperation. Informed consent has been obtained.  Description of procedure:  The patient was taken to the operating room and general anesthesia was induced.  The patient was placed in the dorsal lithotomy position, prepped and draped in the usual sterile fashion, and preoperative antibiotics were administered. A preoperative time-out was performed.   A 21 French cystoscope was lubricated and passed per urethra and advanced into the bladder with findings as described above.   The cystoscope was removed and A 26 French continuous-flow resectoscope sheath with visual obturator was  lubricated and passed per urethra.  The visual obturator was exchanged for the [facility] resectoscope.  The bladder was then re-examined after the resectoscope was placed.   Using loop cautery resection, the tumor and abnormal appearing mucosa were resected and removed for permanent pathologic analysis with specimens as described above.   Hemostasis was then achieved with the button electrode and the bladder was emptied and reinspected with no further bleeding noted at the end of the procedure.    The resectoscope was removed and a 12 French Foley catheter was placed with return of rose' effluent upon irrigation.  Plan: He will be discharged with an indwelling Foley catheter with follow-up 09/30/2019 for catheter removal   Joseph Houston C. Bernardo Heater,  MD

## 2019-09-28 NOTE — Anesthesia Preprocedure Evaluation (Signed)
Anesthesia Evaluation  Patient identified by MRN, date of birth, ID band Patient awake    Reviewed: Allergy & Precautions, H&P , NPO status , reviewed documented beta blocker date and time   History of Anesthesia Complications (+) DIFFICULT AIRWAY and history of anesthetic complications  Airway Mallampati: III  TM Distance: >3 FB Neck ROM: full    Dental  (+) Upper Dentures, Lower Dentures   Pulmonary shortness of breath, neg sleep apnea, COPD, neg recent URI, former smoker,    Pulmonary exam normal        Cardiovascular hypertension, (-) angina+ CAD  (-) Past MI, (-) Cardiac Stents and (-) CABG Normal cardiovascular exam+ dysrhythmias + pacemaker      Neuro/Psych  Headaches, neg Seizures Anxiety    GI/Hepatic Neg liver ROS, hiatal hernia, GERD  Medicated and Controlled,  Endo/Other  diabetes (borderline)  Renal/GU Renal disease     Musculoskeletal  (+) Arthritis ,   Abdominal   Peds  Hematology  (+) Blood dyscrasia, anemia ,   Anesthesia Other Findings Past Medical History: No date: Anemia     Comment:  was treated in the past, not now No date: Anxiety No date: Arthritis     Comment:  knees No date: BPH (benign prostatic hypertrophy) No date: Chronic kidney disease     Comment:  stage 3 ckd No date: COPD (chronic obstructive pulmonary disease) (HCC)     Comment:  has had most of his life. has not used an inhaler for               years No date: Coronary artery disease No date: Difficult intubation     Comment:  pt reports bad sore throat after surgery. pt unsure of               this response No date: Dyspnea 2016: Dysrhythmia     Comment:  atrial fibrillation, bradycardia No date: GERD (gastroesophageal reflux disease) No date: Headache No date: History of hiatal hernia 04/2018: History of kidney stones No date: Hypertension No date: Pre-diabetes     Comment:  dr. Ginette Pitman following 2017: Presence of  permanent cardiac pacemaker 2019: Psoriasis     Comment:  elbows No date: Restless leg 1992: Vertigo No date: Yeast infection     Comment:  in groin for 50 years  Past Surgical History: 09/25/2016: CATARACT EXTRACTION W/PHACO; Right     Comment:  Procedure: CATARACT EXTRACTION PHACO AND INTRAOCULAR               LENS PLACEMENT ([place]);  Surgeon: Estill Cotta, MD;                Location: [facility] ORS;  Service: Ophthalmology;  Laterality:              Right;  [place] 01:58 AP% 18.1 CDE 43.36 Fluid pack #               [id number] 12/22/2014: ELECTROPHYSIOLOGIC STUDY; N/A     Comment:  Procedure: CARDIOVERSION;  Surgeon: Teodoro Spray, MD;               Location: [facility] ORS;  Service: Cardiovascular;                Laterality: N/A; 1950: FINGER SURGERY; Right     Comment:  index finger cut off half way No date: HERNIA REPAIR; Bilateral     Comment:  inguinal repaired x 4 No date: INSERT / REPLACE / REMOVE PACEMAKER  Comment:  12/17 2012: JOINT REPLACEMENT; Right     Comment:  partial knee replacement 07/17/2016: PACEMAKER INSERTION; Left     Comment:  Procedure: INSERTION PACEMAKER;  Surgeon: Isaias Cowman, MD;  Location: [facility] ORS;  Service:               Cardiovascular;  Laterality: Left; 1950: TONSILLECTOMY 09/30/2017: TOTAL KNEE ARTHROPLASTY; Left     Comment:  Procedure: TOTAL KNEE ARTHROPLASTY;  Surgeon: Hessie Knows, MD;  Location: [facility] ORS;  Service: Orthopedics;               Laterality: Left; No date: TRANSURETHRAL RESECTION OF PROSTATE     Reproductive/Obstetrics                             Anesthesia Physical  Anesthesia Plan  ASA: IV  Anesthesia Plan: General   Post-op Pain Management:    Induction: Intravenous  PONV Risk Score and Plan: 2 and Treatment may vary due to age or medical condition, Ondansetron and Dexamethasone  Airway Management Planned: LMA  Additional Equipment:   Intra-op Plan:    Post-operative Plan: Extubation in OR  Informed Consent: I have reviewed the patients History and Physical, chart, labs and discussed the procedure including the risks, benefits and alternatives for the proposed anesthesia with the patient or authorized representative who has indicated his/her understanding and acceptance.     Dental Advisory Given  Plan Discussed with: CRNA  Anesthesia Plan Comments:         Anesthesia Quick Evaluation

## 2019-09-28 NOTE — OR Nursing (Signed)
Pt. Had a sudden episode of dizziness while walking to bathroom. Pt. Assisted to wheelchair by nursing staff and returned to bed without incidnt. Dr. Rosey Bath called and EKG performed and given to Dr. Rosey Bath for viewing.

## 2019-09-28 NOTE — OR Nursing (Signed)
Dr. Darlys Gales at [facility] for Joseph Houston. He was made aware of pt.'s dizziness event and given EKG. He advised he was "fine if Dr Rosey Bath was OK with it"

## 2019-09-28 NOTE — Discharge Instructions (Addendum)
Transurethral Resection of Bladder Tumor (TURBT) or Bladder Biopsy   Definition:  Transurethral Resection of the Bladder Tumor is a surgical procedure used to diagnose and remove tumors within the bladder.  General instructions:     Your recent bladder surgery requires very little post hospital care but some definite precautions.  Despite the fact that no skin incisions were used, the area around the bladder incisions are raw and covered with scabs to promote healing and prevent bleeding. Certain precautions are needed to insure that the scabs are not disturbed over the next 2-4 weeks while the healing proceeds.  Because the raw surface inside your bladder and the irritating effects of urine you may expect frequency of urination and/or urgency (a stronger desire to urinate) and perhaps even getting up at night more often. This will usually resolve or improve slowly over the healing period. You may see some blood in your urine over the first 6 weeks. Do not be alarmed, even if the urine was clear for a while. Get off your feet and drink lots of fluids until clearing occurs. If you start to pass clots or don't improve call us.  Diet:  You may return to your normal diet immediately. Because of the raw surface of your bladder, alcohol, spicy foods, foods high in acid and drinks with caffeine may cause irritation or frequency and should be used in moderation. To keep your urine flowing freely and avoid constipation, drink plenty of fluids during the day (8-10 glasses). Tip: Avoid cranberry juice because it is very acidic.  Activity:  Your physical activity doesn't need to be restricted. However, if you are very active, you may see some blood in the urine. We suggest that you reduce your activity under the circumstances until the bleeding has stopped.  Bowels:  It is important to keep your bowels regular during the postoperative period. Straining with bowel movements can cause bleeding. A bowel  movement every other day is reasonable. Use a mild laxative if needed, such as milk of magnesia 2-3 tablespoons, or 2 Dulcolax tablets. Call if you continue to have problems. If you had been taking narcotics for pain, before, during or after your surgery, you may be constipated. Take a laxative if necessary.    Medication:  You should resume your pre-surgery medications unless told not to. In addition you may be given an antibiotic to prevent or treat infection. Antibiotics are not always necessary. All medication should be taken as prescribed until the bottles are finished unless you are having an unusual reaction to one of the drugs  -A prescription for pain medication was sent to your pharmacy -Hold Eliquis for 24-48 hours, until urine is clear to pink   [facility] [address], [place], [place] [address] [phone]  [place]   1) The drugs that you were given will stay in your system until tomorrow so for the next 24 hours you should not:  A) Drive an automobile B) Make any legal decisions C) Drink any alcoholic beverage   2) You may resume regular meals tomorrow.  Today it is better to start with liquids and gradually work up to solid foods.  You may eat anything you prefer, but it is better to start with liquids, then soup and crackers, and gradually work up to solid foods.   3) Please notify your doctor immediately if you have any unusual bleeding, trouble breathing, redness and pain at the surgery site, drainage, fever,  or pain not relieved by medication.    4) Additional Instructions:        Please contact your physician with any problems or Same Day Surgery at [phone], Monday through Friday 6 am to 4 pm, or [place] at [facility] number at [phone].

## 2019-09-28 NOTE — Anesthesia Procedure Notes (Signed)
Procedure Name: LMA Insertion Date/Time: 09/28/2019 11:30 AM Performed by: Jonna Clark, CRNA Pre-anesthesia Checklist: Patient identified, Patient being monitored, Timeout performed, Emergency Drugs available and Suction available Patient Re-evaluated:Patient Re-evaluated prior to induction Oxygen Delivery Method: Circle system utilized Preoxygenation: Pre-oxygenation with 100% oxygen Induction Type: IV induction Ventilation: Mask ventilation without difficulty LMA: LMA inserted LMA Size: 5.0 Tube type: Oral Number of attempts: 1 Placement Confirmation: positive ETCO2 and breath sounds checked- equal and bilateral Tube secured with: Tape Dental Injury: Teeth and Oropharynx as per pre-operative assessment

## 2019-09-29 ENCOUNTER — Ambulatory Visit: Payer: Medicare PPO | Admitting: Physician Assistant

## 2019-09-29 ENCOUNTER — Ambulatory Visit (INDEPENDENT_AMBULATORY_CARE_PROVIDER_SITE_OTHER): Payer: Medicare PPO | Admitting: Physician Assistant

## 2019-09-29 ENCOUNTER — Encounter: Payer: Self-pay | Admitting: Physician Assistant

## 2019-09-29 VITALS — BP 128/85 | HR 74 | Ht 71.0 in | Wt 247.0 lb

## 2019-09-29 DIAGNOSIS — N3289 Other specified disorders of bladder: Secondary | ICD-10-CM

## 2019-09-29 LAB — SURGICAL PATHOLOGY

## 2019-09-29 NOTE — Anesthesia Postprocedure Evaluation (Signed)
Anesthesia Post Note  Patient: Yiovanni Gartman Weare  Procedure(s) Performed: TRANSURETHRAL RESECTION OF BLADDER TUMOR (TURBT) (N/A Bladder) CYSTOSCOPY (N/A Bladder)  Patient location during evaluation: PACU Anesthesia Type: General Level of consciousness: awake and alert Pain management: pain level controlled Vital Signs Assessment: post-procedure vital signs reviewed and stable Respiratory status: spontaneous breathing, nonlabored ventilation, respiratory function stable and patient connected to nasal cannula oxygen Cardiovascular status: blood pressure returned to baseline and stable Postop Assessment: no apparent nausea or vomiting Anesthetic complications: no     Last Vitals:  Vitals:   09/28/19 1450 09/28/19 1511  BP: (!) 153/72 (!) 152/75  Pulse: 62 64  Resp: 16 16  Temp: (!) 36 C (!) 36.3 C  SpO2: 96% 97%    Last Pain:  Vitals:   09/28/19 1511  TempSrc: Temporal  PainSc: 3                  Martha Clan

## 2019-09-29 NOTE — Progress Notes (Signed)
Catheter Removal  Patient is present today for a catheter removal.  25ml of water was drained from the balloon. A 20FR foley cath was removed from the bladder no complications were noted . Patient tolerated well.  Performed by: Debroah Loop, PA-C   Follow up/ Additional notes: Resume Eliquis per Dr. Dene Gentry instructions. 1 month postop follow-up with Dr. Bernardo Heater as below. Future Appointments  Date Time Provider [place]  10/27/2019  2:15 PM Stoioff, Ronda Fairly, MD [facility] None

## 2019-10-03 ENCOUNTER — Encounter: Payer: Self-pay | Admitting: Emergency Medicine

## 2019-10-03 ENCOUNTER — Emergency Department
Admission: EM | Admit: 2019-10-03 | Discharge: 2019-10-03 | Disposition: A | Discharge status: Home / Self Care | Payer: Medicare PPO | Attending: Emergency Medicine | Admitting: Emergency Medicine

## 2019-10-03 ENCOUNTER — Other Ambulatory Visit: Payer: Self-pay

## 2019-10-03 DIAGNOSIS — R339 Retention of urine, unspecified: Secondary | ICD-10-CM | POA: Diagnosis not present

## 2019-10-03 DIAGNOSIS — Z5321 Procedure and treatment not carried out due to patient leaving prior to being seen by health care provider: Secondary | ICD-10-CM | POA: Diagnosis not present

## 2019-10-03 LAB — URINALYSIS, COMPLETE (UACMP) WITH MICROSCOPIC
Bacteria, UA: NONE SEEN
Bilirubin Urine: NEGATIVE
Glucose, UA: NEGATIVE mg/dL
Ketones, ur: 5 mg/dL — AB
Nitrite: NEGATIVE
Protein, ur: 100 mg/dL — AB
RBC / HPF: >50 RBC/hpf — ABNORMAL HIGH (ref 0–5)
Specific Gravity, Urine: 1.021 (ref 1.005–1.030)
Squamous Epithelial / HPF: NONE SEEN (ref 0–5)
WBC, UA: >50 WBC/hpf — ABNORMAL HIGH (ref 0–5)
pH: 5 (ref 5.0–8.0)

## 2019-10-03 NOTE — ED Notes (Signed)
Spoke with Dr. Corky Downs regarding patient, instructed to rescan bladder in approximately 1 hour if patient still in waiting room.

## 2019-10-03 NOTE — ED Notes (Signed)
Patient up to stat desk, states he is going to leave because his wife has to drive the school bus in the morning and "you are not doing anything for me".  Apologized to patient.  Patient refused to stay.

## 2019-10-03 NOTE — ED Triage Notes (Signed)
Pt arrives ambulatory to triage with c/o urinary retention since Wednesday. Pt reports having to get up 20+ times at night to strain and get little bit out. Pt had a procedure on Tuesday and had a catheter inserted which was removed Wednesday.

## 2019-10-03 NOTE — ED Notes (Signed)
Bladder scan = 83ml. This verified with [facility]. Will go to MD for further instructions.

## 2019-10-04 ENCOUNTER — Encounter: Payer: Self-pay | Admitting: Physician Assistant

## 2019-10-04 ENCOUNTER — Ambulatory Visit (INDEPENDENT_AMBULATORY_CARE_PROVIDER_SITE_OTHER): Payer: Medicare PPO | Admitting: Physician Assistant

## 2019-10-04 VITALS — BP 106/68 | HR 73 | Ht 70.0 in | Wt 211.0 lb

## 2019-10-04 DIAGNOSIS — R310 Gross hematuria: Secondary | ICD-10-CM

## 2019-10-04 DIAGNOSIS — R350 Frequency of micturition: Secondary | ICD-10-CM

## 2019-10-04 MED ORDER — CIPROFLOXACIN HCL 250 MG PO TABS: 250.0000 mg | ORAL_TABLET | Freq: Two times a day (BID) | ORAL | 0 refills | Status: DC

## 2019-10-04 NOTE — Progress Notes (Signed)
10/04/2019 10:58 AM   Joseph Houston 07-05-1936 [id number]  CC: Urinary frequency  HPI: Joseph Houston is a 84 y.o. male who presents today for evaluation of possible UTI. He is an established [facility] patient who last saw me on 09/29/2019 for catheter removal s/p cystoscopy and TURBT with Dr. Bernardo Heater on 09/28/2019.  Today, he reports dysuria, urgency, and frequency since he had his catheter removed 5 days ago.  He notes his gross hematuria resolved 2 days after catheter removal, however he restarted Eliquis yesterday and noticed a return of pink hematuria this morning that is thin and runny and without clots. He denies nausea, vomiting, fever, chills, and flank pain.  In-office UA today positive for 3+ blood, 3+ protein, and 1+ leukocyte esterase; urine microscopy with 11-30 WBCs/HPF and>30 RBCs/HPF. PVR 73mL.  PMH: Past Medical History:  Diagnosis Date  . Anemia    was treated in the past, not now  . Anxiety   . Arthritis    knees  . BPH (benign prostatic hypertrophy)   . Chronic kidney disease    stage 3 ckd  . COPD (chronic obstructive pulmonary disease) ([place])    has had most of his life. has not used an inhaler for years  . Coronary artery disease   . Difficult intubation    pt reports bad sore throat after surgery. pt unsure of this response  . Dyspnea   . Dysrhythmia 2016   atrial fibrillation, bradycardia  . GERD (gastroesophageal reflux disease)   . Headache   . History of hiatal hernia   . History of kidney stones 04/2018  . Hypertension   . Pneumonia   . Pre-diabetes    dr. Ginette Pitman following  . Presence of permanent cardiac pacemaker 2017  . Psoriasis 2019   elbows  . Restless leg   . Vertigo 1992  . Yeast infection    in groin for 50 years    Surgical History: Past Surgical History:  Procedure Laterality Date  . CATARACT EXTRACTION W/PHACO Right 09/25/2016   Procedure: CATARACT EXTRACTION PHACO AND INTRAOCULAR LENS PLACEMENT ([facility]);  Surgeon: Estill Cotta, MD;  Location: [facility] ORS;  Service: Ophthalmology;  Laterality: Right;  [place] 01:58 AP% 18.1 CDE 43.36 Fluid pack # [id number]  . CYSTOSCOPY N/A 09/28/2019   Procedure: CYSTOSCOPY;  Surgeon: Abbie Sons, MD;  Location: [facility] ORS;  Service: Urology;  Laterality: N/A;  . CYSTOSCOPY/URETEROSCOPY/HOLMIUM LASER/STENT PLACEMENT Right 04/13/2018   Procedure: CYSTOSCOPY/URETEROSCOPY/HOLMIUM LASER/STENT PLACEMENT;  Surgeon: Hollice Espy, MD;  Location: [facility] ORS;  Service: Urology;  Laterality: Right;  . ELECTROPHYSIOLOGIC STUDY N/A 12/22/2014   Procedure: CARDIOVERSION;  Surgeon: Teodoro Spray, MD;  Location: [facility] ORS;  Service: Cardiovascular;  Laterality: N/A;  . FINGER SURGERY Right 1950   index finger cut off half way  . HERNIA REPAIR Bilateral    inguinal repaired x 4  . INSERT / REPLACE / REMOVE PACEMAKER     12/17  . JOINT REPLACEMENT Right 2012   partial knee replacement  . PACEMAKER INSERTION Left 07/17/2016   Procedure: INSERTION PACEMAKER;  Surgeon: Isaias Cowman, MD;  Location: [facility] ORS;  Service: Cardiovascular;  Laterality: Left;  . PARTIAL KNEE ARTHROPLASTY Right 2012  . TONSILLECTOMY  1950  . TOTAL KNEE ARTHROPLASTY Left 09/30/2017   Procedure: TOTAL KNEE ARTHROPLASTY;  Surgeon: Hessie Knows, MD;  Location: [facility] ORS;  Service: Orthopedics;  Laterality: Left;  . TRANSURETHRAL RESECTION OF BLADDER TUMOR N/A 09/28/2019   Procedure: TRANSURETHRAL RESECTION OF BLADDER TUMOR (TURBT);  Surgeon: Abbie Sons, MD;  Location: [facility] ORS;  Service: Urology;  Laterality: N/A;  . TRANSURETHRAL RESECTION OF PROSTATE      Home Medications:  Allergies as of 10/04/2019      Reactions   Mold Extract [trichophyton] Swelling, Other (See Comments)   Tightness in throat   Dog Epithelium Swelling, Other (See Comments)   Also, cats too He is near the animals and does okay but allergy testing indicates that he   Is allergic to animals   Other Swelling   Allergic to a variety of  trees that surround his home. Allergy testing indicated that  this is an allergy. Took allergy shots for many years. Stopped 1 yr ago   Tree Extract Swelling   Allergic to a variety of trees that surround his home. Allergy testing indicated that  this is an allergy.  Took allergy shots for many years. Stopped 1 yr ago   Codeine Nausea And Vomiting   Sulfa Antibiotics Nausea And Vomiting      Medication List       Accurate as of October 04, 2019 10:58 AM. If you have any questions, ask your nurse or doctor.        acetaminophen 650 MG CR tablet Commonly known as: TYLENOL Take 1,300 mg by mouth every 8 (eight) hours as needed for pain.   amLODipine 5 MG tablet Commonly known as: NORVASC Take 1 tablet (5 mg total) by mouth daily.   aspirin EC 81 MG tablet Take 81 mg by mouth daily.   cyanocobalamin 1000 MCG tablet Take 1,000 mcg by mouth daily.   Eliquis 5 MG Tabs tablet Generic drug: apixaban TAKE 1 TABLET BY MOUTH EVERY 12 HOURS   FISH OIL PO Take 1 capsule by mouth 2 (two) times daily.   flecainide 100 MG tablet Commonly known as: TAMBOCOR Take 100 mg by mouth 2 (two) times daily.   HYDROcodone-acetaminophen 5-325 MG tablet Commonly known as: NORCO/VICODIN Take 1 tablet by mouth every 6 (six) hours as needed for moderate pain.   losartan 100 MG tablet Commonly known as: COZAAR Take 100 mg by mouth at bedtime.   memantine 10 MG tablet Commonly known as: NAMENDA Take by mouth 2 (two) times daily.   metoprolol tartrate 25 MG tablet Commonly known as: LOPRESSOR Take 25 mg by mouth 2 (two) times daily.   omeprazole 20 MG capsule Commonly known as: PRILOSEC 2 (two) times daily before a meal.   sildenafil 20 MG tablet Commonly known as: REVATIO Take 20 mg by mouth.       Allergies:  Allergies  Allergen Reactions  . Mold Extract [Trichophyton] Swelling and Other (See Comments)    Tightness in throat  . Dog Epithelium Swelling and Other (See Comments)     Also, cats too He is near the animals and does okay but allergy testing indicates that he   Is allergic to animals    . Other Swelling    Allergic to a variety of trees that surround his home. Allergy testing indicated that  this is an allergy. Took allergy shots for many years. Stopped 1 yr ago  . Tree Extract Swelling    Allergic to a variety of trees that surround his home. Allergy testing indicated that  this is an allergy.  Took allergy shots for many years. Stopped 1 yr ago  . Codeine Nausea And Vomiting  . Sulfa Antibiotics Nausea And Vomiting    Family History: No family history on file.  Social History:   reports that he quit smoking about 53 years ago. His smoking use included cigarettes. He smoked 2.00 packs per day. He has never used smokeless tobacco. He reports that he does not drink alcohol or use drugs.  Physical Exam: BP 106/68   Pulse 73   Ht 5\' 10"  (1.778 m)   Wt 211 lb (95.7 kg)   BMI 30.28 kg/m   Constitutional:  Alert and oriented, no acute distress, nontoxic appearing HEENT: [place], AT Cardiovascular: No clubbing, cyanosis, or edema Respiratory: Normal respiratory effort, no increased work of breathing Skin: No rashes, bruises or suspicious lesions Neurologic: Grossly intact, no focal deficits, moving all 4 extremities Psychiatric: Normal mood and affect  Laboratory Data: Results for orders placed or performed in visit on 10/04/19  Microscopic Examination   URINE  Result Value Ref Range   WBC, UA 11-30 (A) 0 - 5 /hpf   RBC >30 (A) 0 - 2 /hpf   Epithelial Cells (non renal) 0-10 0 - 10 /hpf   Casts Present (A) None seen /lpf   Cast Type Hyaline casts N/A   Bacteria, UA Few None seen/Few  Urinalysis, Complete  Result Value Ref Range   Specific Gravity, UA >1.030 (H) 1.005 - 1.030   pH, UA 5.5 5.0 - 7.5   Color, UA Orange Yellow   Appearance Ur Cloudy (A) Clear   Leukocytes,UA 1+ (A) Negative   Protein,UA 3+ (A) Negative/Trace   Glucose, UA  Negative Negative   Ketones, UA Negative Negative   RBC, UA 3+ (A) Negative   Bilirubin, UA Negative Negative   Urobilinogen, Ur 1.0 0.2 - 1.0 mg/dL   Nitrite, UA Negative Negative   Microscopic Examination See below:    Assessment & Plan:   1. Urinary frequency 84 year old male s/p TURBT presents with a 5-day history of dysuria, urgency, frequency, and intermittent gross hematuria in the setting of Eliquis.  PVR WNL, UA notable for pyuria and microscopic hematuria.  Will send for culture.  Based on symptoms, will start patient on ciprofloxacin today.  Counseled him to contact the office if his symptoms do not improve after 48 hours on these antibiotics.  He expressed understanding. - Bladder Scan (Post Void Residual) in office - Urinalysis, Complete - CULTURE, URINE COMPREHENSIVE - ciprofloxacin (CIPRO) 250 MG tablet; Take 1 tablet (250 mg total) by mouth 2 (two) times daily for 7 days.  Dispense: 14 tablet; Refill: 0   Return if symptoms worsen or fail to improve.  Debroah Loop, PA-C  [facility] [address], [place], [place] [address] [phone]

## 2019-10-05 ENCOUNTER — Telehealth: Payer: Self-pay | Admitting: Urology

## 2019-10-05 DIAGNOSIS — C679 Malignant neoplasm of bladder, unspecified: Secondary | ICD-10-CM

## 2019-10-05 LAB — URINALYSIS, COMPLETE
Bilirubin, UA: NEGATIVE
Glucose, UA: NEGATIVE
Ketones, UA: NEGATIVE
Nitrite, UA: NEGATIVE
Specific Gravity, UA: >1.03 — ABNORMAL HIGH (ref 1.005–1.030)
Urobilinogen, Ur: 1 mg/dL (ref 0.2–1.0)
pH, UA: 5.5 (ref 5.0–7.5)

## 2019-10-05 LAB — MICROSCOPIC EXAMINATION: RBC, Urine: >30 /hpf — AB (ref 0–2)

## 2019-10-05 NOTE — Telephone Encounter (Signed)
I contacted Joseph Houston regarding his pathology result.  He underwent TURBT last week for a solid nodule or bladder tumor and marked mucosal edema.  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  A separate specimen of the base this tumor was sent and there is muscle invasion.  The edematous bladder mucosa did show high-grade papillary urothelial carcinoma.  The path report was discussed with Joseph Houston and his wife.  They were informed the standard treatment of muscle invasive bladder cancer is radical cystectomy.  He was also informed that this procedure is not performed at [facility] and that would need to refer to [facility] or [facility].  He requested [facility] referral.  Referral for the multidisciplinary oncology clinic was entered.

## 2019-10-06 ENCOUNTER — Inpatient Hospital Stay
Admission: EM | Admit: 2019-10-06 | Discharge: 2019-10-09 | DRG: 247 | Disposition: A | Discharge status: Home / Self Care | Payer: Medicare PPO | Attending: Internal Medicine | Admitting: Internal Medicine

## 2019-10-06 ENCOUNTER — Encounter
Admission: EM | Disposition: A | Discharge status: Home / Self Care | Payer: Self-pay | Source: Home / Self Care | Attending: Internal Medicine

## 2019-10-06 ENCOUNTER — Encounter: Payer: Self-pay | Admitting: Emergency Medicine

## 2019-10-06 ENCOUNTER — Emergency Department: Payer: Medicare PPO

## 2019-10-06 ENCOUNTER — Other Ambulatory Visit: Payer: Self-pay

## 2019-10-06 DIAGNOSIS — I2584 Coronary atherosclerosis due to calcified coronary lesion: Secondary | ICD-10-CM | POA: Diagnosis present

## 2019-10-06 DIAGNOSIS — I495 Sick sinus syndrome: Secondary | ICD-10-CM | POA: Diagnosis present

## 2019-10-06 DIAGNOSIS — F419 Anxiety disorder, unspecified: Secondary | ICD-10-CM | POA: Diagnosis present

## 2019-10-06 DIAGNOSIS — Z950 Presence of cardiac pacemaker: Secondary | ICD-10-CM

## 2019-10-06 DIAGNOSIS — Z20822 Contact with and (suspected) exposure to covid-19: Secondary | ICD-10-CM | POA: Diagnosis present

## 2019-10-06 DIAGNOSIS — I213 ST elevation (STEMI) myocardial infarction of unspecified site: Secondary | ICD-10-CM | POA: Diagnosis present

## 2019-10-06 DIAGNOSIS — Z79899 Other long term (current) drug therapy: Secondary | ICD-10-CM

## 2019-10-06 DIAGNOSIS — K219 Gastro-esophageal reflux disease without esophagitis: Secondary | ICD-10-CM | POA: Diagnosis present

## 2019-10-06 DIAGNOSIS — I25119 Atherosclerotic heart disease of native coronary artery with unspecified angina pectoris: Secondary | ICD-10-CM | POA: Diagnosis present

## 2019-10-06 DIAGNOSIS — I2111 ST elevation (STEMI) myocardial infarction involving right coronary artery: Secondary | ICD-10-CM | POA: Diagnosis not present

## 2019-10-06 DIAGNOSIS — I129 Hypertensive chronic kidney disease with stage 1 through stage 4 chronic kidney disease, or unspecified chronic kidney disease: Secondary | ICD-10-CM | POA: Diagnosis present

## 2019-10-06 DIAGNOSIS — Z6832 Body mass index (BMI) 32.0-32.9, adult: Secondary | ICD-10-CM

## 2019-10-06 DIAGNOSIS — Z7901 Long term (current) use of anticoagulants: Secondary | ICD-10-CM

## 2019-10-06 DIAGNOSIS — E669 Obesity, unspecified: Secondary | ICD-10-CM | POA: Diagnosis present

## 2019-10-06 DIAGNOSIS — E785 Hyperlipidemia, unspecified: Secondary | ICD-10-CM | POA: Diagnosis present

## 2019-10-06 DIAGNOSIS — Z96652 Presence of left artificial knee joint: Secondary | ICD-10-CM | POA: Diagnosis present

## 2019-10-06 DIAGNOSIS — I4891 Unspecified atrial fibrillation: Secondary | ICD-10-CM | POA: Diagnosis present

## 2019-10-06 DIAGNOSIS — N1831 Chronic kidney disease, stage 3a: Secondary | ICD-10-CM | POA: Diagnosis present

## 2019-10-06 DIAGNOSIS — I482 Chronic atrial fibrillation, unspecified: Secondary | ICD-10-CM | POA: Diagnosis present

## 2019-10-06 DIAGNOSIS — J449 Chronic obstructive pulmonary disease, unspecified: Secondary | ICD-10-CM | POA: Diagnosis present

## 2019-10-06 DIAGNOSIS — Z87891 Personal history of nicotine dependence: Secondary | ICD-10-CM

## 2019-10-06 DIAGNOSIS — R079 Chest pain, unspecified: Secondary | ICD-10-CM

## 2019-10-06 DIAGNOSIS — R310 Gross hematuria: Secondary | ICD-10-CM | POA: Diagnosis not present

## 2019-10-06 DIAGNOSIS — N400 Enlarged prostate without lower urinary tract symptoms: Secondary | ICD-10-CM | POA: Diagnosis present

## 2019-10-06 DIAGNOSIS — Z7982 Long term (current) use of aspirin: Secondary | ICD-10-CM

## 2019-10-06 DIAGNOSIS — C679 Malignant neoplasm of bladder, unspecified: Secondary | ICD-10-CM

## 2019-10-06 DIAGNOSIS — I10 Essential (primary) hypertension: Secondary | ICD-10-CM | POA: Diagnosis present

## 2019-10-06 DIAGNOSIS — R7303 Prediabetes: Secondary | ICD-10-CM | POA: Diagnosis present

## 2019-10-06 DIAGNOSIS — M1711 Unilateral primary osteoarthritis, right knee: Secondary | ICD-10-CM | POA: Diagnosis present

## 2019-10-06 HISTORY — PX: CORONARY STENT INTERVENTION: CATH118234

## 2019-10-06 HISTORY — PX: CORONARY/GRAFT ACUTE MI REVASCULARIZATION: CATH118305

## 2019-10-06 HISTORY — PX: LEFT HEART CATH AND CORONARY ANGIOGRAPHY: CATH118249

## 2019-10-06 LAB — RESPIRATORY PANEL BY RT PCR (FLU A&B, COVID)
Influenza A by PCR: NEGATIVE
Influenza B by PCR: NEGATIVE
SARS Coronavirus 2 by RT PCR: NEGATIVE

## 2019-10-06 LAB — CBC
HCT: 42.9 % (ref 39.0–52.0)
Hemoglobin: 13.8 g/dL (ref 13.0–17.0)
MCH: 29.9 pg (ref 26.0–34.0)
MCHC: 32.2 g/dL (ref 30.0–36.0)
MCV: 92.9 fL (ref 80.0–100.0)
Platelets: 225 10*3/uL (ref 150–400)
RBC: 4.62 MIL/uL (ref 4.22–5.81)
RDW: 13.1 % (ref 11.5–15.5)
WBC: 10.4 10*3/uL (ref 4.0–10.5)
nRBC: 0 % (ref 0.0–0.2)

## 2019-10-06 LAB — PROTIME-INR
INR: 1.4 — ABNORMAL HIGH (ref 0.8–1.2)
Prothrombin Time: 17.4 seconds — ABNORMAL HIGH (ref 11.4–15.2)

## 2019-10-06 LAB — BASIC METABOLIC PANEL
Anion gap: 10 (ref 5–15)
BUN: 32 mg/dL — ABNORMAL HIGH (ref 8–23)
CO2: 23 mmol/L (ref 22–32)
Calcium: 9.2 mg/dL (ref 8.9–10.3)
Chloride: 104 mmol/L (ref 98–111)
Creatinine, Ser: 1.62 mg/dL — ABNORMAL HIGH (ref 0.61–1.24)
GFR calc Af Amer: 45 mL/min — ABNORMAL LOW (ref >60)
GFR calc non Af Amer: 39 mL/min — ABNORMAL LOW (ref >60)
Glucose, Bld: 145 mg/dL — ABNORMAL HIGH (ref 70–99)
Potassium: 3.7 mmol/L (ref 3.5–5.1)
Sodium: 137 mmol/L (ref 135–145)

## 2019-10-06 LAB — POCT ACTIVATED CLOTTING TIME: Activated Clotting Time: 367 seconds

## 2019-10-06 LAB — TROPONIN I (HIGH SENSITIVITY): Troponin I (High Sensitivity): 10 ng/L (ref <18)

## 2019-10-06 LAB — APTT: aPTT: 31 seconds (ref 24–36)

## 2019-10-06 SURGERY — CORONARY/GRAFT ACUTE MI REVASCULARIZATION
Anesthesia: Moderate Sedation

## 2019-10-06 MED ORDER — HEPARIN (PORCINE) IN NACL 1000-0.9 UT/500ML-% IV SOLN
INTRAVENOUS | Status: AC
  Filled 2019-10-06: qty 1000

## 2019-10-06 MED ORDER — MIDAZOLAM HCL 2 MG/2ML IJ SOLN
INTRAMUSCULAR | Status: DC | PRN
  Administered 2019-10-06: 0.5 mg via INTRAVENOUS

## 2019-10-06 MED ORDER — FENTANYL CITRATE (PF) 100 MCG/2ML IJ SOLN
50.0000 ug | Freq: Once | INTRAMUSCULAR | Status: AC
  Administered 2019-10-06: 50 ug via INTRAVENOUS

## 2019-10-06 MED ORDER — SODIUM CHLORIDE 0.9 % IV SOLN
INTRAVENOUS | Status: AC | PRN
  Administered 2019-10-06: 1.75 mg/kg/h via INTRAVENOUS

## 2019-10-06 MED ORDER — IOHEXOL 300 MG/ML  SOLN
INTRAMUSCULAR | Status: DC | PRN
  Administered 2019-10-06: 245 mL

## 2019-10-06 MED ORDER — BIVALIRUDIN BOLUS VIA INFUSION - CUPID
INTRAVENOUS | Status: DC | PRN
  Administered 2019-10-06: 81.975 mg via INTRAVENOUS

## 2019-10-06 MED ORDER — SODIUM CHLORIDE 0.9 % IV BOLUS
1000.0000 mL | Freq: Once | INTRAVENOUS | Status: AC
  Administered 2019-10-06: 1000 mL via INTRAVENOUS

## 2019-10-06 MED ORDER — NITROGLYCERIN 1 MG/10 ML FOR IR/CATH LAB
INTRA_ARTERIAL | Status: AC
  Filled 2019-10-06: qty 10

## 2019-10-06 MED ORDER — BIVALIRUDIN TRIFLUOROACETATE 250 MG IV SOLR
INTRAVENOUS | Status: AC
  Filled 2019-10-06: qty 250

## 2019-10-06 MED ORDER — ASPIRIN 81 MG PO CHEW
324.0000 mg | CHEWABLE_TABLET | Freq: Once | ORAL | Status: AC
  Administered 2019-10-06: 324 mg via ORAL

## 2019-10-06 MED ORDER — FENTANYL CITRATE (PF) 100 MCG/2ML IJ SOLN
50.0000 ug | Freq: Once | INTRAMUSCULAR | Status: AC
  Administered 2019-10-06: 22:00:00 50 ug via INTRAVENOUS
  Filled 2019-10-06: qty 2

## 2019-10-06 MED ORDER — TICAGRELOR 90 MG PO TABS
ORAL_TABLET | ORAL | Status: DC | PRN
  Administered 2019-10-06: 180 mg via ORAL

## 2019-10-06 MED ORDER — HEPARIN (PORCINE) 25000 UT/250ML-% IV SOLN: 1100.0000 [IU]/h | INTRAVENOUS | Status: DC

## 2019-10-06 MED ORDER — MIDAZOLAM HCL 2 MG/2ML IJ SOLN
INTRAMUSCULAR | Status: AC
  Filled 2019-10-06: qty 2

## 2019-10-06 MED ORDER — MORPHINE SULFATE (PF) 2 MG/ML IV SOLN
INTRAVENOUS | Status: AC
  Filled 2019-10-06: qty 1

## 2019-10-06 MED ORDER — HEPARIN SODIUM (PORCINE) 5000 UNIT/ML IJ SOLN
4000.0000 [IU] | Freq: Once | INTRAMUSCULAR | Status: AC
  Administered 2019-10-06: 4000 [IU] via INTRAVENOUS

## 2019-10-06 MED ORDER — FENTANYL CITRATE (PF) 100 MCG/2ML IJ SOLN
INTRAMUSCULAR | Status: AC
  Filled 2019-10-06: qty 2

## 2019-10-06 MED ORDER — FENTANYL CITRATE (PF) 100 MCG/2ML IJ SOLN
INTRAMUSCULAR | Status: DC | PRN
  Administered 2019-10-06: 25 ug via INTRAVENOUS

## 2019-10-06 MED ORDER — TICAGRELOR 90 MG PO TABS
ORAL_TABLET | ORAL | Status: AC
  Filled 2019-10-06: qty 2

## 2019-10-06 SURGICAL SUPPLY — 17 items
BALLN TREK RX 3.0X15 (BALLOONS) ×3
BALLOON TREK RX 3.0X15 (BALLOONS) ×1 IMPLANT
CATH INFINITI 5FR ANG PIGTAIL (CATHETERS) ×3 IMPLANT
CATH INFINITI 5FR JL4 (CATHETERS) ×3 IMPLANT
CATH INFINITI 5FR JL5 (CATHETERS) ×3 IMPLANT
CATH INFINITI JR4 5F (CATHETERS) ×3 IMPLANT
CATH VISTA GUIDE 6FR JR4 (CATHETERS) ×3 IMPLANT
DEVICE CLOSURE MYNXGRIP 6/7F (Vascular Products) ×3 IMPLANT
DEVICE INFLAT 30 PLUS (MISCELLANEOUS) ×3 IMPLANT
KIT MANI 3VAL PERCEP (MISCELLANEOUS) ×3 IMPLANT
NEEDLE PERC 18GX7CM (NEEDLE) ×3 IMPLANT
PACK CARDIAC CATH (CUSTOM PROCEDURE TRAY) ×3 IMPLANT
SHEATH AVANTI 6FR X 11CM (SHEATH) ×3 IMPLANT
STENT RESOLUTE ONYX 3.5X18 (Permanent Stent) ×3 IMPLANT
WIRE G HI TQ BMW 190 (WIRE) ×3 IMPLANT
WIRE GUIDERIGHT .035X150 (WIRE) ×3 IMPLANT
WIRE INTUITION PROPEL ST 180CM (WIRE) ×3 IMPLANT

## 2019-10-06 NOTE — ED Provider Notes (Signed)
[facility] Emergency Department Provider Note  ____________________________________________   I have reviewed the triage vital signs and the nursing notes.   HISTORY  Chief Complaint Chest Pain   History limited by: Not Limited   HPI Joseph Houston is a 84 y.o. male who presents to the emergency department today because of concern for chest pain. The patient stated that the pain started suddenly. The patient has been under a lot of stress recently with recent cancer diagnosis. The pain does radiate down his left arm. The patient denies having similar pain in the past. Does have a pacemaker.   Records reviewed. Per medical record review patient has a history of COPD, pacemaker.   Past Medical History:  Diagnosis Date  . Anemia    was treated in the past, not now  . Anxiety   . Arthritis    knees  . BPH (benign prostatic hypertrophy)   . Chronic kidney disease    stage 3 ckd  . COPD (chronic obstructive pulmonary disease) ([place])    has had most of his life. has not used an inhaler for years  . Coronary artery disease   . Difficult intubation    pt reports bad sore throat after surgery. pt unsure of this response  . Dyspnea   . Dysrhythmia 2016   atrial fibrillation, bradycardia  . GERD (gastroesophageal reflux disease)   . Headache   . History of hiatal hernia   . History of kidney stones 04/2018  . Hypertension   . Pneumonia   . Pre-diabetes    dr. Ginette Pitman following  . Presence of permanent cardiac pacemaker 2017  . Psoriasis 2019   elbows  . Restless leg   . Vertigo 1992  . Yeast infection    in groin for 50 years    Patient Active Problem List   Diagnosis Date Noted  . Left knee pain 10/06/2017  . Primary localized osteoarthritis of left knee 09/30/2017  . Sick sinus syndrome ([place]) 07/17/2016  . Carpal tunnel syndrome of right wrist 04/09/2016  . Trigger little finger of right hand 04/09/2016  . Bradycardia 01/02/2016  .  Elevated blood sugar 09/25/2015  . Bilateral hearing loss 04/20/2015  . A-fib ([place]) 12/22/2014  . 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 Acid reflux 12/22/2014  . BP (high blood pressure) 12/22/2014  . Chronic anemia 11/08/2014  . Testis pain 10/29/2013    Past Surgical History:  Procedure Laterality Date  . CATARACT EXTRACTION W/PHACO Right 09/25/2016   Procedure: CATARACT EXTRACTION PHACO AND INTRAOCULAR LENS PLACEMENT (IOC);  Surgeon: Estill Cotta, MD;  Location: [facility] ORS;  Service: Ophthalmology;  Laterality: Right;  [place] 01:58 AP% 18.1 CDE 43.36 Fluid pack # [id number]  . CYSTOSCOPY N/A 09/28/2019   Procedure: CYSTOSCOPY;  Surgeon: Abbie Sons, MD;  Location: [facility] ORS;  Service: Urology;  Laterality: N/A;  . CYSTOSCOPY/URETEROSCOPY/HOLMIUM LASER/STENT PLACEMENT Right 04/13/2018   Procedure: CYSTOSCOPY/URETEROSCOPY/HOLMIUM LASER/STENT PLACEMENT;  Surgeon: Hollice Espy, MD;  Location: [facility] ORS;  Service: Urology;  Laterality: Right;  . ELECTROPHYSIOLOGIC STUDY N/A 12/22/2014   Procedure: CARDIOVERSION;  Surgeon: Teodoro Spray, MD;  Location: [facility] ORS;  Service: Cardiovascular;  Laterality: N/A;  . FINGER SURGERY Right 1950   index finger cut off half way  . HERNIA REPAIR Bilateral    inguinal repaired x 4  . INSERT / REPLACE / REMOVE PACEMAKER     12/17  . JOINT REPLACEMENT Right 2012   partial knee replacement  . PACEMAKER INSERTION  Left 07/17/2016   Procedure: INSERTION PACEMAKER;  Surgeon: Isaias Cowman, MD;  Location: [facility] ORS;  Service: Cardiovascular;  Laterality: Left;  . PARTIAL KNEE ARTHROPLASTY Right 2012  . TONSILLECTOMY  1950  . TOTAL KNEE ARTHROPLASTY Left 09/30/2017   Procedure: TOTAL KNEE ARTHROPLASTY;  Surgeon: Hessie Knows, MD;  Location: [facility] ORS;  Service: Orthopedics;  Laterality: Left;  . TRANSURETHRAL RESECTION OF BLADDER TUMOR N/A 09/28/2019   Procedure: TRANSURETHRAL RESECTION OF BLADDER TUMOR (TURBT);  Surgeon: Abbie Sons, MD;   Location: [facility] ORS;  Service: Urology;  Laterality: N/A;  . TRANSURETHRAL RESECTION OF PROSTATE      Prior to Admission medications   Medication Sig Start Date End Date Taking? Authorizing Provider  acetaminophen (TYLENOL) 650 MG CR tablet Take 1,300 mg by mouth every 8 (eight) hours as needed for pain.    [provider]  amLODipine (NORVASC) 5 MG tablet Take 1 tablet (5 mg total) by mouth daily. 07/18/16   Clabe Seal, PA-C  apixaban (ELIQUIS) 5 MG TABS tablet TAKE 1 TABLET BY MOUTH EVERY 12 HOURS 09/20/19   [provider]  aspirin EC 81 MG tablet Take 81 mg by mouth daily.    [provider]  ciprofloxacin (CIPRO) 250 MG tablet Take 1 tablet (250 mg total) by mouth 2 (two) times daily for 7 days. 10/04/19 10/11/19  Debroah Loop, PA-C  cyanocobalamin 1000 MCG tablet Take 1,000 mcg by mouth daily.     [provider]  flecainide (TAMBOCOR) 100 MG tablet Take 100 mg by mouth 2 (two) times daily.    [provider]  HYDROcodone-acetaminophen (NORCO/VICODIN) 5-325 MG tablet Take 1 tablet by mouth every 6 (six) hours as needed for moderate pain. 09/28/19   Stoioff, Ronda Fairly, MD  losartan (COZAAR) 100 MG tablet Take 100 mg by mouth at bedtime.     [provider]  memantine (NAMENDA) 10 MG tablet Take by mouth 2 (two) times daily.  10/20/18 10/20/19  [provider]  metoprolol tartrate (LOPRESSOR) 25 MG tablet Take 25 mg by mouth 2 (two) times daily.    [provider]  mirtazapine (REMERON) 15 MG tablet Take 15 mg by mouth at bedtime. 10/06/19   [provider]  Omega-3 Fatty Acids (FISH OIL PO) Take 1 capsule by mouth 2 (two) times daily.    [provider]  omeprazole (PRILOSEC) 20 MG capsule 2 (two) times daily before a meal.  12/15/17   [provider]  sildenafil (REVATIO) 20 MG tablet Take 20 mg by mouth.  10/09/18   [provider]    Allergies Mold extract [trichophyton], Dog  epithelium, Other, Tree extract, Codeine, and Sulfa antibiotics  History reviewed. No pertinent family history.  Social History Social History   Tobacco Use  . Smoking status: Former Smoker    Packs/day: 2.00    Types: Cigarettes    Quit date: 12/21/1965    Years since quitting: 53.8  . Smokeless tobacco: Never Used  Substance Use Topics  . Alcohol use: No    Comment: last drink 1964  . Drug use: No    Review of Systems Constitutional: No fever/chills Eyes: No visual changes. ENT: No sore throat. Cardiovascular: Positive for chest pain. Respiratory: Denies shortness of breath. Gastrointestinal: No abdominal pain.  No nausea, no vomiting.  No diarrhea.   Genitourinary: Negative for dysuria. Musculoskeletal: Positive for left arm pain. Skin: Negative for rash. Neurological: Negative for headaches, focal weakness or numbness.  ____________________________________________   PHYSICAL  EXAM:  VITAL SIGNS: ED Triage Vitals  Enc Vitals Group     BP 10/06/19 2151 97/72     Pulse Rate 10/06/19 2151 76     Resp 10/06/19 2151 15     Temp 10/06/19 2151 98.8 F (37.1 C)     Temp Source 10/06/19 2151 Rectal     SpO2 10/06/19 2151 100 %     Weight 10/06/19 2203 240 lb 15.4 oz (109.3 kg)     Height --      Head Circumference --      Peak Flow --      Pain Score 10/06/19 2203 8   Constitutional: Alert and oriented. Appears uncomfortable.  Eyes: Conjunctivae are normal.  ENT      Head: Normocephalic and atraumatic.      Nose: No congestion/rhinnorhea.      Mouth/Throat: Mucous membranes are moist.      Neck: No stridor. Hematological/Lymphatic/Immunilogical: No cervical lymphadenopathy. Cardiovascular: Normal rate, regular rhythm.  No murmurs, rubs, or gallops.  Respiratory: Normal respiratory effort without tachypnea nor retractions. Breath sounds are clear and equal bilaterally. No wheezes/rales/rhonchi. Gastrointestinal: Soft and non tender. No rebound. No guarding.   Genitourinary: Deferred Musculoskeletal: Normal range of motion in all extremities. No lower extremity edema. Neurologic:  Normal speech and language. No gross focal neurologic deficits are appreciated.  Skin:  Skin is warm, dry and intact. No rash noted. Psychiatric: Mood and affect are normal. Speech and behavior are normal. Patient exhibits appropriate insight and judgment.  ____________________________________________    LABS (pertinent positives/negatives)  Trop hs 10 CBC wbc 10.4, hgb 13.8, plt 225 BMP na 137, k 3.7, glu 145, cr 1.62  ____________________________________________   EKG  I, Nance Pear, attending physician, personally viewed and interpreted this EKG  EKG Time: 2151 Rate: 86 Rhythm: sinus rhytm vs paced rhythm Axis: left axis deviation Intervals: qtc 554 QRS: wide ST changes: st elevation II, III, aVF Impression: abnormal ekg   ____________________________________________    RADIOLOGY  CXR No active disease ____________________________________________   PROCEDURES  Procedures  CRITICAL CARE Performed by: Nance Pear   Total critical care time: 30 minutes  Critical care time was exclusive of separately billable procedures and treating other patients.  Critical care was necessary to treat or prevent imminent or life-threatening deterioration.  Critical care was time spent personally by me on the following activities: development of treatment plan with patient and/or surrogate as well as nursing, discussions with consultants, evaluation of patient's response to treatment, examination of patient, obtaining history from patient or surrogate, ordering and performing treatments and interventions, ordering and review of laboratory studies, ordering and review of radiographic studies, pulse oximetry and re-evaluation of patient's condition.  ____________________________________________   INITIAL IMPRESSION / ASSESSMENT AND PLAN / ED  COURSE  Pertinent labs & imaging results that were available during my care of the patient were reviewed by me and considered in my medical decision making (see chart for details).   Patient presented to the emergency department because of concern for chest pain. On exam patient appeared uncomfortable. Initial EKG was concerning for ST elevation in II, III aVF. Compared to prior EKGs he has been wide due to pacing but level of elevation was never this pronounced. Patient also found to be hypotensive upon arrival so fluids were started. Patient was given aspirin and pain medication as blood pressure allowed. Given concern for abnormal ekg code STEMI was called. Discussed with Dr. Clayborn Bigness with cardiology who evaluated the patient and  took patient for emergent catheterization.   ____________________________________________   FINAL CLINICAL IMPRESSION(S) / ED DIAGNOSES  Final diagnoses:  Chest pain, unspecified type     Note: This dictation was prepared with Dragon dictation. Any transcriptional errors that result from this process are unintentional     Nance Pear, MD 10/07/19 0004

## 2019-10-06 NOTE — ED Notes (Signed)
EDP on phone with [employer] representative now.

## 2019-10-06 NOTE — ED Notes (Signed)
Dr. Callwood at bedside at this time.  

## 2019-10-06 NOTE — ED Triage Notes (Signed)
Pt to ED from home c/o left chest pain sharp radiating to back and left arm, denies SOB, was sitting in chair when it started while eating dinner.  Pt presents A&Ox4, pale, diaphoretic, cool to the touch.  Dr. Archie Balboa at bedside upon arrival, patient placed onto zoll pads, clothes removed and placed into belongings bag, and cardiologist being contacted by EDP.

## 2019-10-06 NOTE — Progress Notes (Signed)
   10/06/19 2300  Clinical Encounter Type  Visited With Patient and family together  Visit Type Initial  Referral From Nurse  Consult/Referral To Chaplain  Spiritual Encounters  Spiritual Needs Emotional;Prayer  Stress Factors  Patient Stress Factors Major life changes  Family Stress Factors Major life changes;Loss of control  This was a Code stemi:  Patient came in complaining about chest pains and was immediately taken to cath lab. Chaplain stayed by with family member and provided comfort and said a silent prayer for the patient while he was undergoing his procedure.

## 2019-10-07 ENCOUNTER — Encounter: Payer: Self-pay | Admitting: Internal Medicine

## 2019-10-07 DIAGNOSIS — E785 Hyperlipidemia, unspecified: Secondary | ICD-10-CM | POA: Diagnosis present

## 2019-10-07 DIAGNOSIS — N1831 Chronic kidney disease, stage 3a: Secondary | ICD-10-CM | POA: Diagnosis present

## 2019-10-07 DIAGNOSIS — Z6832 Body mass index (BMI) 32.0-32.9, adult: Secondary | ICD-10-CM | POA: Diagnosis not present

## 2019-10-07 DIAGNOSIS — C679 Malignant neoplasm of bladder, unspecified: Secondary | ICD-10-CM | POA: Diagnosis present

## 2019-10-07 DIAGNOSIS — I129 Hypertensive chronic kidney disease with stage 1 through stage 4 chronic kidney disease, or unspecified chronic kidney disease: Secondary | ICD-10-CM | POA: Diagnosis present

## 2019-10-07 DIAGNOSIS — Z87891 Personal history of nicotine dependence: Secondary | ICD-10-CM | POA: Diagnosis not present

## 2019-10-07 DIAGNOSIS — I213 ST elevation (STEMI) myocardial infarction of unspecified site: Secondary | ICD-10-CM | POA: Diagnosis present

## 2019-10-07 DIAGNOSIS — F419 Anxiety disorder, unspecified: Secondary | ICD-10-CM | POA: Diagnosis present

## 2019-10-07 DIAGNOSIS — I10 Essential (primary) hypertension: Secondary | ICD-10-CM | POA: Diagnosis not present

## 2019-10-07 DIAGNOSIS — Z20822 Contact with and (suspected) exposure to covid-19: Secondary | ICD-10-CM | POA: Diagnosis present

## 2019-10-07 DIAGNOSIS — J449 Chronic obstructive pulmonary disease, unspecified: Secondary | ICD-10-CM | POA: Diagnosis present

## 2019-10-07 DIAGNOSIS — Z950 Presence of cardiac pacemaker: Secondary | ICD-10-CM | POA: Diagnosis not present

## 2019-10-07 DIAGNOSIS — I4891 Unspecified atrial fibrillation: Secondary | ICD-10-CM | POA: Diagnosis not present

## 2019-10-07 DIAGNOSIS — Z79899 Other long term (current) drug therapy: Secondary | ICD-10-CM | POA: Diagnosis not present

## 2019-10-07 DIAGNOSIS — Z7982 Long term (current) use of aspirin: Secondary | ICD-10-CM | POA: Diagnosis not present

## 2019-10-07 DIAGNOSIS — I2111 ST elevation (STEMI) myocardial infarction involving right coronary artery: Secondary | ICD-10-CM | POA: Diagnosis present

## 2019-10-07 DIAGNOSIS — M1711 Unilateral primary osteoarthritis, right knee: Secondary | ICD-10-CM | POA: Diagnosis present

## 2019-10-07 DIAGNOSIS — E669 Obesity, unspecified: Secondary | ICD-10-CM | POA: Diagnosis present

## 2019-10-07 DIAGNOSIS — R310 Gross hematuria: Secondary | ICD-10-CM | POA: Diagnosis not present

## 2019-10-07 DIAGNOSIS — I482 Chronic atrial fibrillation, unspecified: Secondary | ICD-10-CM | POA: Diagnosis present

## 2019-10-07 DIAGNOSIS — I25119 Atherosclerotic heart disease of native coronary artery with unspecified angina pectoris: Secondary | ICD-10-CM | POA: Diagnosis present

## 2019-10-07 DIAGNOSIS — I2584 Coronary atherosclerosis due to calcified coronary lesion: Secondary | ICD-10-CM | POA: Diagnosis present

## 2019-10-07 DIAGNOSIS — Z7901 Long term (current) use of anticoagulants: Secondary | ICD-10-CM | POA: Diagnosis not present

## 2019-10-07 DIAGNOSIS — R7303 Prediabetes: Secondary | ICD-10-CM | POA: Diagnosis present

## 2019-10-07 DIAGNOSIS — Z96652 Presence of left artificial knee joint: Secondary | ICD-10-CM | POA: Diagnosis present

## 2019-10-07 DIAGNOSIS — K219 Gastro-esophageal reflux disease without esophagitis: Secondary | ICD-10-CM | POA: Diagnosis present

## 2019-10-07 DIAGNOSIS — N400 Enlarged prostate without lower urinary tract symptoms: Secondary | ICD-10-CM | POA: Diagnosis present

## 2019-10-07 HISTORY — DX: ST elevation (STEMI) myocardial infarction involving right coronary artery: I21.11

## 2019-10-07 LAB — CBC
HCT: 40.2 % (ref 39.0–52.0)
Hemoglobin: 12.7 g/dL — ABNORMAL LOW (ref 13.0–17.0)
MCH: 29.7 pg (ref 26.0–34.0)
MCHC: 31.6 g/dL (ref 30.0–36.0)
MCV: 94.1 fL (ref 80.0–100.0)
Platelets: 166 10*3/uL (ref 150–400)
RBC: 4.27 MIL/uL (ref 4.22–5.81)
RDW: 13 % (ref 11.5–15.5)
WBC: 9.5 10*3/uL (ref 4.0–10.5)
nRBC: 0 % (ref 0.0–0.2)

## 2019-10-07 LAB — TROPONIN I (HIGH SENSITIVITY)
Troponin I (High Sensitivity): 16503 ng/L (ref <18)
Troponin I (High Sensitivity): 26013 ng/L (ref <18)
Troponin I (High Sensitivity): >27000 ng/L (ref <18)

## 2019-10-07 LAB — GLUCOSE, CAPILLARY: Glucose-Capillary: 131 mg/dL — ABNORMAL HIGH (ref 70–99)

## 2019-10-07 LAB — CULTURE, URINE COMPREHENSIVE

## 2019-10-07 LAB — BASIC METABOLIC PANEL
Anion gap: 6 (ref 5–15)
BUN: 31 mg/dL — ABNORMAL HIGH (ref 8–23)
CO2: 26 mmol/L (ref 22–32)
Calcium: 8.5 mg/dL — ABNORMAL LOW (ref 8.9–10.3)
Chloride: 104 mmol/L (ref 98–111)
Creatinine, Ser: 1.36 mg/dL — ABNORMAL HIGH (ref 0.61–1.24)
GFR calc Af Amer: 56 mL/min — ABNORMAL LOW (ref >60)
GFR calc non Af Amer: 48 mL/min — ABNORMAL LOW (ref >60)
Glucose, Bld: 224 mg/dL — ABNORMAL HIGH (ref 70–99)
Potassium: 4.5 mmol/L (ref 3.5–5.1)
Sodium: 136 mmol/L (ref 135–145)

## 2019-10-07 LAB — MRSA PCR SCREENING: MRSA by PCR: POSITIVE — AB

## 2019-10-07 MED ORDER — DOXEPIN HCL 10 MG PO CAPS
20.0000 mg | ORAL_CAPSULE | Freq: Every evening | ORAL | Status: DC | PRN
  Administered 2019-10-07 – 2019-10-09 (×2): 20 mg via ORAL
  Filled 2019-10-07 (×3): qty 2

## 2019-10-07 MED ORDER — ONDANSETRON HCL 4 MG/2ML IJ SOLN: 4.0000 mg | Freq: Four times a day (QID) | INTRAMUSCULAR | Status: DC | PRN

## 2019-10-07 MED ORDER — FLECAINIDE ACETATE 100 MG PO TABS
100.0000 mg | ORAL_TABLET | Freq: Two times a day (BID) | ORAL | Status: DC
  Administered 2019-10-07 – 2019-10-08 (×2): 100 mg via ORAL
  Filled 2019-10-07 (×3): qty 1

## 2019-10-07 MED ORDER — HYDROCODONE-ACETAMINOPHEN 5-325 MG PO TABS: 1.0000 | ORAL_TABLET | Freq: Four times a day (QID) | ORAL | Status: DC | PRN

## 2019-10-07 MED ORDER — SODIUM CHLORIDE 0.9 % IV SOLN
0.2500 mg/kg/h | INTRAVENOUS | Status: AC
  Filled 2019-10-07: qty 250

## 2019-10-07 MED ORDER — MEMANTINE HCL 10 MG PO TABS
10.0000 mg | ORAL_TABLET | Freq: Two times a day (BID) | ORAL | Status: DC
  Administered 2019-10-07 – 2019-10-09 (×4): 10 mg via ORAL
  Filled 2019-10-07 (×5): qty 1

## 2019-10-07 MED ORDER — DIAZEPAM 5 MG PO TABS: 5.0000 mg | ORAL_TABLET | Freq: Four times a day (QID) | ORAL | Status: DC | PRN

## 2019-10-07 MED ORDER — TICAGRELOR 90 MG PO TABS
90.0000 mg | ORAL_TABLET | Freq: Two times a day (BID) | ORAL | Status: DC
  Administered 2019-10-07 – 2019-10-08 (×4): 90 mg via ORAL
  Filled 2019-10-07 (×4): qty 1

## 2019-10-07 MED ORDER — ACETAMINOPHEN 325 MG PO TABS: 650.0000 mg | ORAL_TABLET | Freq: Four times a day (QID) | ORAL | Status: DC | PRN

## 2019-10-07 MED ORDER — ALPRAZOLAM 0.5 MG PO TABS: 1.0000 mg | ORAL_TABLET | Freq: Two times a day (BID) | ORAL | Status: DC | PRN

## 2019-10-07 MED ORDER — MIRTAZAPINE 15 MG PO TABS
15.0000 mg | ORAL_TABLET | Freq: Every day | ORAL | Status: DC
  Administered 2019-10-07 – 2019-10-08 (×2): 15 mg via ORAL
  Filled 2019-10-07 (×2): qty 1

## 2019-10-07 MED ORDER — CHLORHEXIDINE GLUCONATE CLOTH 2 % EX PADS
6.0000 | MEDICATED_PAD | Freq: Every day | CUTANEOUS | Status: DC
  Administered 2019-10-07 – 2019-10-09 (×3): 6 via TOPICAL

## 2019-10-07 MED ORDER — HYDRALAZINE HCL 20 MG/ML IJ SOLN: 10.0000 mg | INTRAMUSCULAR | Status: AC | PRN

## 2019-10-07 MED ORDER — ONDANSETRON HCL 4 MG PO TABS: 4.0000 mg | ORAL_TABLET | Freq: Four times a day (QID) | ORAL | Status: DC | PRN

## 2019-10-07 MED ORDER — LABETALOL HCL 5 MG/ML IV SOLN: 10.0000 mg | INTRAVENOUS | Status: AC | PRN

## 2019-10-07 MED ORDER — ATORVASTATIN CALCIUM 80 MG PO TABS
80.0000 mg | ORAL_TABLET | Freq: Every day | ORAL | Status: DC
  Administered 2019-10-07 – 2019-10-08 (×2): 80 mg via ORAL
  Filled 2019-10-07 (×2): qty 1

## 2019-10-07 MED ORDER — VITAMIN B-12 1000 MCG PO TABS
1000.0000 ug | ORAL_TABLET | Freq: Every day | ORAL | Status: DC
  Administered 2019-10-07 – 2019-10-09 (×3): 1000 ug via ORAL
  Filled 2019-10-07 (×3): qty 1

## 2019-10-07 MED ORDER — LOSARTAN POTASSIUM 50 MG PO TABS
100.0000 mg | ORAL_TABLET | Freq: Every day | ORAL | Status: DC
  Administered 2019-10-07 – 2019-10-09 (×3): 100 mg via ORAL
  Filled 2019-10-07 (×3): qty 2

## 2019-10-07 MED ORDER — ACETAMINOPHEN 325 MG PO TABS: 650.0000 mg | ORAL_TABLET | ORAL | Status: DC | PRN

## 2019-10-07 MED ORDER — SODIUM CHLORIDE 0.9 % IV SOLN: 250.0000 mL | INTRAVENOUS | Status: DC | PRN

## 2019-10-07 MED ORDER — SODIUM CHLORIDE 0.9% FLUSH: 3.0000 mL | INTRAVENOUS | Status: DC | PRN

## 2019-10-07 MED ORDER — AMLODIPINE BESYLATE 5 MG PO TABS
5.0000 mg | ORAL_TABLET | Freq: Every day | ORAL | Status: DC
  Administered 2019-10-07 – 2019-10-08 (×2): 5 mg via ORAL
  Filled 2019-10-07 (×2): qty 1

## 2019-10-07 MED ORDER — ASPIRIN 81 MG PO CHEW
81.0000 mg | CHEWABLE_TABLET | Freq: Every day | ORAL | Status: DC
  Administered 2019-10-07 – 2019-10-09 (×3): 81 mg via ORAL
  Filled 2019-10-07 (×3): qty 1

## 2019-10-07 MED ORDER — METOPROLOL TARTRATE 25 MG PO TABS
25.0000 mg | ORAL_TABLET | Freq: Two times a day (BID) | ORAL | Status: DC
  Administered 2019-10-07 – 2019-10-08 (×3): 25 mg via ORAL
  Filled 2019-10-07 (×3): qty 1

## 2019-10-07 MED ORDER — SODIUM CHLORIDE 0.9 % WEIGHT BASED INFUSION
1.0000 mL/kg/h | INTRAVENOUS | Status: AC
  Administered 2019-10-07: 1 mL/kg/h via INTRAVENOUS

## 2019-10-07 MED ORDER — HEPARIN (PORCINE) IN NACL 1000-0.9 UT/500ML-% IV SOLN
INTRAVENOUS | Status: DC | PRN
  Administered 2019-10-06: 500 mL

## 2019-10-07 MED ORDER — DOXEPIN HCL 25 MG PO CAPS
25.0000 mg | ORAL_CAPSULE | Freq: Every evening | ORAL | Status: DC | PRN
  Filled 2019-10-07 (×2): qty 1

## 2019-10-07 MED ORDER — SENNOSIDES-DOCUSATE SODIUM 8.6-50 MG PO TABS: 1.0000 | ORAL_TABLET | Freq: Every evening | ORAL | Status: DC | PRN

## 2019-10-07 MED ORDER — ZOLPIDEM TARTRATE 5 MG PO TABS: 5.0000 mg | ORAL_TABLET | Freq: Every evening | ORAL | Status: DC | PRN

## 2019-10-07 MED ORDER — SODIUM CHLORIDE 0.9% FLUSH
3.0000 mL | Freq: Two times a day (BID) | INTRAVENOUS | Status: DC
  Administered 2019-10-07 – 2019-10-09 (×4): 3 mL via INTRAVENOUS

## 2019-10-07 MED ORDER — PANTOPRAZOLE SODIUM 40 MG PO TBEC
40.0000 mg | DELAYED_RELEASE_TABLET | Freq: Every day | ORAL | Status: DC
  Administered 2019-10-07 – 2019-10-09 (×3): 40 mg via ORAL
  Filled 2019-10-07 (×3): qty 1

## 2019-10-07 MED ORDER — ACETAMINOPHEN 650 MG RE SUPP: 650.0000 mg | Freq: Four times a day (QID) | RECTAL | Status: DC | PRN

## 2019-10-07 NOTE — Progress Notes (Signed)
CRITICAL VALUE ALERT  Critical Value: Trop level  Date & Time Notied:  10/07/2019/ 04:53  Provider Notified: Dr Damita Dunnings  Orders Received/Actions taken: Monitor

## 2019-10-07 NOTE — Progress Notes (Signed)
Patient transferred from ICU to floor. VSS. No chest pain. Ambulated well. Groin site soft, no oozing, no hematoma. Bedside report given to RN.

## 2019-10-07 NOTE — Progress Notes (Signed)
Pt is having bloody urine. Dr Clayborn Bigness observed the urine during his rounds this morning.

## 2019-10-07 NOTE — TOC Initial Note (Signed)
Transition of Care [facility]) - Initial/Assessment Note    Patient Details  Name: Joseph Houston MRN: [id number] Date of Birth: [birth year]  [name]) CM/SW Contact:    Magnus Ivan, LCSW Phone Number: 10/07/2019, 3:24 PM  Clinical Narrative:                CSW met with patient and patient's wife at bedside. Explained CSW role. Patient lives with his wife who provides support to him. PCP is Dr. Ginette Pitman. CSW will submit request for follow up PCP appointment to be scheduled for within 5-7 days of discharge. Patient uses [facility] [employer] in [place]. Patient reported he has a walker and bedside commode at home. Patient's wife is currently driving him to appointments. Patient reported he was driving, but recently his doctor advised him not to drive at this time. Patient stated he received home health services in the past, but could not remember with which agency. He was at [facility] for rehabilitation in 2019. Patient reported he and his wife would be agreeable to home health, SNF, or other recommendations depending on what is recommended prior to discharge. They denied needs at this time. CSW will continue to follow up.   Expected Discharge Plan: Home/Self Care Barriers to Discharge: Continued Medical Work up   Patient Goals and CMS Choice        Expected Discharge Plan and Services Expected Discharge Plan: Home/Self Care       Living arrangements for the past 2 months: Single Family Home                                      Prior Living Arrangements/Services Living arrangements for the past 2 months: Single Family Home Lives with:: Spouse Patient language and need for interpreter reviewed:: Yes Do you feel safe going back to the place where you live?: Yes      Need for Family Participation in Patient Care: Yes (Comment) Care giver support system in place?: Yes (comment) Current home services: DME Criminal Activity/Legal Involvement  Pertinent to Current Situation/Hospitalization: No - Comment as needed  Activities of Daily Living Home Assistive Devices/Equipment: None ADL Screening (condition at time of admission) Patient's cognitive ability adequate to safely complete daily activities?: Yes Is the patient deaf or have difficulty hearing?: No Does the patient have difficulty seeing, even when wearing glasses/contacts?: No Does the patient have difficulty concentrating, remembering, or making decisions?: No Patient able to express need for assistance with ADLs?: Yes Does the patient have difficulty dressing or bathing?: No Independently performs ADLs?: Yes (appropriate for developmental age) Does the patient have difficulty walking or climbing stairs?: Yes Weakness of Legs: Both Weakness of Arms/Hands: None  Permission Sought/Granted                  Emotional Assessment Appearance:: Appears stated age Attitude/Demeanor/Rapport: Engaged, Gracious Affect (typically observed): Appropriate, Calm Orientation: : Oriented to Self, Oriented to Place, Oriented to  Time, Oriented to Situation Alcohol / Substance Use: Not Applicable Psych Involvement: No (comment)  Admission diagnosis:  STEMI involving right coronary artery ([place]) [I21.11] STEMI (ST elevation myocardial infarction) [facility]) [I21.3] Patient Active Problem List   Diagnosis Date Noted  . STEMI involving right coronary artery ([place]) 10/07/2019  . Chronic anticoagulation 10/07/2019  . Bladder carcinoma ([place]) 10/07/2019  . STEMI (ST elevation myocardial infarction) ([place]) 10/07/2019  . Left knee pain  10/06/2017  . Primary localized osteoarthritis of left knee 09/30/2017  . Sick sinus syndrome ([place]) 07/17/2016  . Carpal tunnel syndrome of right wrist 04/09/2016  . Trigger little finger of right hand 04/09/2016  . Bradycardia 01/02/2016  . Elevated blood sugar 09/25/2015  . Bilateral hearing loss 04/20/2015  . A-fib ([place]) 12/22/2014  . Benign fibroma of  prostate 12/22/2014  . Acid reflux 12/22/2014  . BP (high blood pressure) 12/22/2014  . Chronic anemia 11/08/2014  . Testis pain 10/29/2013   PCP:  Tracie Harrier, MD Pharmacy:   [facility] [address], [place] - [place] [address] [place] [address] Phone: [phone] Fax: [phone]     Social Determinants of Health (SDOH) Interventions    Readmission Risk Interventions Readmission Risk Prevention Plan 10/07/2019  Transportation Screening Complete  PCP or Specialist Appt within 3-5 Days Complete  HRI or Home Care Consult Complete  Medication Review (RN Care Manager) Complete  Some recent data might be hidden

## 2019-10-07 NOTE — Progress Notes (Signed)
CRITICAL VALUE ALERT  Critical Value:  MRSA postive  Date & Time Notied:  10/07/19, 0453  Provider Notified: Dr Judd Gaudier  Orders Received/Actions taken: follow protocol.

## 2019-10-07 NOTE — Progress Notes (Signed)
[facility] Cardiology    SUBJECTIVE: Postop day 1 from STEMI presentation involving proximal RCA status post PCI and stent with DES patient is pain-free now EKG troponins are pending he has had elevation in his last troponin will await to see if the trend is coming down.  Unfortunately patient has developed significant gross hematuria.  Our hope would be to continue aspirin and Brilinta for at least 4 weeks but if that is not possible we may have to discontinue aspirin first and then make a determination clinically if and when told to stop Brilinta.  Dose anticoagulation issues may be made in consultation with urology and their need for further invasive procedures as well as gross hematuria   Vitals:   10/07/19 0500 10/07/19 0600 10/07/19 0700 10/07/19 0800  BP: 117/76 112/73 122/82 (!) 153/88  Pulse: 60 60 60 63  Resp: '13 17 12 15  ' Temp:   97.8 F (36.6 C)   TempSrc:   Oral   SpO2: 97% 98% 98% 100%  Weight:      Height:         Intake/Output Summary (Last 24 hours) at 10/07/2019 0936 Last data filed at 10/07/2019 0017 Gross per 24 hour  Intake 773.44 ml  Output 750 ml  Net 23.44 ml      PHYSICAL EXAM  General: Well developed, well nourished, in no acute distress HEENT:  Normocephalic and atramatic Neck:  No JVD.  Lungs: Clear bilaterally to auscultation and percussion. Heart: HRRR . Normal S1 and S2 without gallops or murmurs.  Abdomen: Bowel sounds are positive, abdomen soft and non-tender  Msk:  Back normal, normal gait. Normal strength and tone for age. Extremities: No clubbing, cyanosis or edema.   Neuro: Alert and oriented X 3. Psych:  Good affect, responds appropriately   LABS: Basic Metabolic Panel: Recent Labs    10/06/19 2153 10/07/19 0250  NA 137 136  K 3.7 4.5  CL 104 104  CO2 23 26  GLUCOSE 145* 224*  BUN 32* 31*  CREATININE 1.62* 1.36*  CALCIUM 9.2 8.5*   Liver Function Tests: No results for input(s): AST, ALT, ALKPHOS, BILITOT, PROT, ALBUMIN in the last  72 hours. No results for input(s): LIPASE, AMYLASE in the last 72 hours. CBC: Recent Labs    10/06/19 2153 10/07/19 0250  WBC 10.4 9.5  HGB 13.8 12.7*  HCT 42.9 40.2  MCV 92.9 94.1  PLT 225 166   Cardiac Enzymes: No results for input(s): CKTOTAL, CKMB, CKMBINDEX, TROPONINI in the last 72 hours. BNP: Invalid input(s): POCBNP D-Dimer: No results for input(s): DDIMER in the last 72 hours. Hemoglobin A1C: No results for input(s): HGBA1C in the last 72 hours. Fasting Lipid Panel: No results for input(s): CHOL, HDL, LDLCALC, TRIG, CHOLHDL, LDLDIRECT in the last 72 hours. Thyroid Function Tests: No results for input(s): TSH, T4TOTAL, T3FREE, THYROIDAB in the last 72 hours.  Invalid input(s): FREET3 Anemia Panel: No results for input(s): VITAMINB12, FOLATE, FERRITIN, TIBC, IRON, RETICCTPCT in the last 72 hours.  CARDIAC CATHETERIZATION  Result Date: 10/07/2019  Prox RCA lesion is 100% stenosed.  Prox LAD to Mid LAD lesion is 50% stenosed.  A drug-eluting stent was successfully placed using a STENT RESOLUTE ONYX 3.5X18.  Post intervention, there is a 0% residual stenosis.  Left ventricular function was mildly diminished at around 45 to 50% with inferior apical hypokinesis  Conclusion Mild reduced left ventricular function with inferior apical hypokinesis ejection fraction of 45 to 50% Cardiac cath with no significant epicardial  disease in the left main LAD and circumflex systems 100% occlusion of proximal RCA infarct-related artery STEMI Successful PCI and stent of STEMI RCA reducing the lesion from 100 done to 0 restoring TIMI-3 flow from 0 deployment of DES 3.5 x 18 mm resolute Onyx to 13 atm Minx was deployed to the right femoral artery   DG Chest Portable 1 View  Result Date: 10/06/2019 CLINICAL DATA:  Chest pain EXAM: PORTABLE CHEST 1 VIEW COMPARISON:  07/17/2016 FINDINGS: Cardiac shadow is again enlarged. Pacing device is again seen and stable. The lungs are well aerated  bilaterally. No focal infiltrate or sizable effusion is seen. No bony abnormality is seen. IMPRESSION: No active disease. Electronically Signed   By: Inez Catalina M.D.   On: 10/06/2019 22:46     Echo pending  TELEMETRY: Paced rhythm nonspecific findings wide QRS  ASSESSMENT AND PLAN:  Principal Problem:   STEMI involving right coronary artery (HCC) Active Problems:   A-fib (HCC)   BP (high blood pressure)   Chronic anticoagulation   Bladder carcinoma (HCC)   STEMI (ST elevation myocardial infarction) (HCC) Postop day 1 STEMI inferior wall Obesity Hyperlipidemia Coronary artery disease Gross hematuria related to bladder cancer Sick sinus syndrome permanent pacemaker implant . Plan Agree with ICU level care Follow-up troponins and EKGs Wean anticoagulation except for aspirin and Brilinta We will try to maintain aspirin and Brilinta for at least 2 weeks At that point we will definitely discontinue aspirin and consider holding Brilinta if the hematuria becomes worse We will discuss the case with urology in anticipation of urological procedure to treat bladder cancer Normally we would wait 6 months for an elective procedure but I am not sure with his bladder cancer that we can wait that long and we may have to compromise and reduce his anticoagulation because of hematuria as well as the need to expedite therapy for his bladder cancer If absolutely necessary for elective procedure we can usually tolerate 4 weeks of aspirin and Brilinta prior to consider holding them temporarily for procedure If persistent symptomatic hematuria continues then it may be an urgent matter we may have to make a tough decision about reducing or discontinuing anticoagulation and antiplatelet therapy Patient can be transferred to telemetry Recommend beta-blocker low-dose ACE inhibitor aspirin Brilinta Lipitor     Yolonda Kida, MD, 10/07/2019 9:36 AM

## 2019-10-07 NOTE — Consult Note (Signed)
CARDIOLOGY CONSULT NOTE               Patient ID: Joseph Houston MRN: [id number] DOB/AGE: 1936-07-30 84 y.o.  Admit date: 10/06/2019 Referring Physician Judd Gaudier, MD hospitalist Primary Physician Dr. Ginette Pitman primary Primary Cardiologist Dr. Ubaldo Glassing Reason for Consultation STEMI inferior wall RCA  HPI: 84 year old white male history of hypertension recently diagnosed with bladder cancer with hematuria patient has history of chronic atrial fibrillation on anticoagulation with Eliquis rhythm control with flecainide patient had abnormal EKG wide-complex with paced rhythm EKG patient developed acute onset of chest pain symptoms worrisome for angina but EKG was relatively nondiagnostic because of baseline abnormalities with his persistent symptoms he was taken to the cardiac Cath Lab for further evaluation with intention to treat for possible STEMI.  Has remained hemodynamically stable with significant 10 out of 10 chest pain by time I saw the patient was comfortable without significant pain blood pressure was 110/70 heart rate 65 sats were normal on 2 L of O2.  Patient had received heparin aspirin in the emergency room and code STEMI was initiated  Review of systems complete and found to be negative unless listed above     Past Medical History:  Diagnosis Date  . Anemia    was treated in the past, not now  . Anxiety   . Arthritis    knees  . BPH (benign prostatic hypertrophy)   . Chronic kidney disease    stage 3 ckd  . COPD (chronic obstructive pulmonary disease) ([place])    has had most of his life. has not used an inhaler for years  . Coronary artery disease   . Difficult intubation    pt reports bad sore throat after surgery. pt unsure of this response  . Dyspnea   . Dysrhythmia 2016   atrial fibrillation, bradycardia  . GERD (gastroesophageal reflux disease)   . Headache   . History of hiatal hernia   . History of kidney stones 04/2018  . Hypertension   . Pneumonia   .  Pre-diabetes    dr. Ginette Pitman following  . Presence of permanent cardiac pacemaker 2017  . Psoriasis 2019   elbows  . Restless leg   . STEMI involving right coronary artery ([place]) 10/07/2019  . Vertigo 1992  . Yeast infection    in groin for 50 years    Past Surgical History:  Procedure Laterality Date  . CATARACT EXTRACTION W/PHACO Right 09/25/2016   Procedure: CATARACT EXTRACTION PHACO AND INTRAOCULAR LENS PLACEMENT (IOC);  Surgeon: Estill Cotta, MD;  Location: [facility];  Service: Ophthalmology;  Laterality: Right;  [place] 01:58 AP% 18.1 CDE 43.36 Fluid pack # [id number]  . CORONARY STENT INTERVENTION N/A 10/06/2019   Procedure: CORONARY STENT INTERVENTION;  Surgeon: Yolonda Kida, MD;  Location: [place] [facility];  Service: Cardiovascular;  Laterality: N/A;  MID RCA  . CORONARY/GRAFT ACUTE MI REVASCULARIZATION N/A 10/06/2019   Procedure: Coronary/Graft Acute MI Revascularization;  Surgeon: Yolonda Kida, MD;  Location: [facility] [facility];  Service: Cardiovascular;  Laterality: N/A;  . CYSTOSCOPY N/A 09/28/2019   Procedure: CYSTOSCOPY;  Surgeon: Abbie Sons, MD;  Location: [facility];  Service: Urology;  Laterality: N/A;  . CYSTOSCOPY/URETEROSCOPY/HOLMIUM LASER/STENT PLACEMENT Right 04/13/2018   Procedure: CYSTOSCOPY/URETEROSCOPY/HOLMIUM LASER/STENT PLACEMENT;  Surgeon: Hollice Espy, MD;  Location: [facility];  Service: Urology;  Laterality: Right;  . ELECTROPHYSIOLOGIC STUDY N/A 12/22/2014   Procedure: CARDIOVERSION;  Surgeon: Teodoro Spray, MD;  Location: [facility];  Service:  Cardiovascular;  Laterality: N/A;  . FINGER SURGERY Right 1950   index finger cut off half way  . HERNIA REPAIR Bilateral    inguinal repaired x 4  . INSERT / REPLACE / REMOVE PACEMAKER     12/17  . JOINT REPLACEMENT Right 2012   partial knee replacement  . LEFT HEART CATH AND CORONARY ANGIOGRAPHY N/A 10/06/2019   Procedure: LEFT HEART CATH AND CORONARY ANGIOGRAPHY;  Surgeon: Yolonda Kida, MD;   Location: [facility];  Service: Cardiovascular;  Laterality: N/A;  . PACEMAKER INSERTION Left 07/17/2016   Procedure: INSERTION PACEMAKER;  Surgeon: Isaias Cowman, MD;  Location: [facility];  Service: Cardiovascular;  Laterality: Left;  . PARTIAL KNEE ARTHROPLASTY Right 2012  . TONSILLECTOMY  1950  . TOTAL KNEE ARTHROPLASTY Left 09/30/2017   Procedure: TOTAL KNEE ARTHROPLASTY;  Surgeon: Hessie Knows, MD;  Location: [facility];  Service: Orthopedics;  Laterality: Left;  . TRANSURETHRAL RESECTION OF BLADDER TUMOR N/A 09/28/2019   Procedure: TRANSURETHRAL RESECTION OF BLADDER TUMOR (TURBT);  Surgeon: Abbie Sons, MD;  Location: [facility];  Service: Urology;  Laterality: N/A;  . TRANSURETHRAL RESECTION OF PROSTATE      Medications Prior to Admission  Medication Sig Dispense Refill Last Dose  . acetaminophen (TYLENOL) 650 MG CR tablet Take 1,300 mg by mouth every 8 (eight) hours as needed for pain.     [name] amLODipine (NORVASC) 5 MG tablet Take 1 tablet (5 mg total) by mouth daily. 30 tablet 5   . apixaban (ELIQUIS) 5 MG TABS tablet TAKE 1 TABLET BY MOUTH EVERY 12 HOURS     . aspirin EC 81 MG tablet Take 81 mg by mouth daily.     . ciprofloxacin (CIPRO) 250 MG tablet Take 1 tablet (250 mg total) by mouth 2 (two) times daily for 7 days. 14 tablet 0   . cyanocobalamin 1000 MCG tablet Take 1,000 mcg by mouth daily.      . flecainide (TAMBOCOR) 100 MG tablet Take 100 mg by mouth 2 (two) times daily.     [name] HYDROcodone-acetaminophen (NORCO/VICODIN) 5-325 MG tablet Take 1 tablet by mouth every 6 (six) hours as needed for moderate pain. 10 tablet 0   . losartan (COZAAR) 100 MG tablet Take 100 mg by mouth at bedtime.      . memantine (NAMENDA) 10 MG tablet Take by mouth 2 (two) times daily.      . metoprolol tartrate (LOPRESSOR) 25 MG tablet Take 25 mg by mouth 2 (two) times daily.     . mirtazapine (REMERON) 15 MG tablet Take 15 mg by mouth at bedtime.     . Omega-3 Fatty Acids (FISH OIL PO)  Take 1 capsule by mouth 2 (two) times daily.     [name] omeprazole (PRILOSEC) 20 MG capsule 2 (two) times daily before a meal.      . sildenafil (REVATIO) 20 MG tablet Take 20 mg by mouth.       Social History   Socioeconomic History  . Marital status: Married    Spouse name: [place]  . Number of children: 4  . Years of education: Not on file  . Highest education level: Not on file  Occupational History  . 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 Smoking status: Former Smoker    Packs/day: 2.00    Types: Cigarettes    Quit date: 12/21/1965    Years since quitting: 53.8  . Smokeless tobacco:  Never Used  Substance and Sexual Activity  . Alcohol use: No    Comment: last drink 1964  . Drug use: No  . Sexual activity: Not Currently  Other Topics Concern  . Not on file  Social History Narrative  . Not on file   Social Determinants of Health   Financial Resource Strain:   . Difficulty of Paying Living Expenses: Not on file  Food Insecurity:   . Worried About Charity fundraiser in the Last Year: Not on file  . Ran Out of Food in the Last Year: Not on file  Transportation Needs:   . Lack of Transportation (Medical): Not on file  . Lack of Transportation (Non-Medical): Not on file  Physical Activity:   . Days of Exercise per Week: Not on file  . Minutes of Exercise per Session: Not on file  Stress:   . Feeling of Stress : Not on file  Social Connections:   . Frequency of Communication with Friends and Family: Not on file  . Frequency of Social Gatherings with Friends and Family: Not on file  . Attends Religious Services: Not on file  . Active Member of Clubs or Organizations: Not on file  . Attends Archivist Meetings: Not on file  . Marital Status: Not on file  Intimate Partner Violence:   . Fear of Current or Ex-Partner: Not on file  . Emotionally Abused: Not on file  . Physically Abused: Not on file  . Sexually Abused: Not on  file    History reviewed. No pertinent family history.    Review of systems complete and found to be negative unless listed above      PHYSICAL EXAM  General: Well developed, well nourished, in no acute distress HEENT:  Normocephalic and atramatic Neck:  No JVD.  Lungs: Clear bilaterally to auscultation and percussion. Heart: HRRR . Normal S1 and S2 without gallops or murmurs.  Abdomen: Bowel sounds are positive, abdomen soft and non-tender  Msk:  Back normal, normal gait. Normal strength and tone for age. Extremities: No clubbing, cyanosis or edema.   Neuro: Alert and oriented X 3. Psych:  Good affect, responds appropriately  Labs:   Lab Results  Component Value Date   WBC 9.5 10/07/2019   HGB 12.7 (L) 10/07/2019   HCT 40.2 10/07/2019   MCV 94.1 10/07/2019   PLT 166 10/07/2019    Recent Labs  Lab 10/07/19 0250  NA 136  K 4.5  CL 104  CO2 26  BUN 31*  CREATININE 1.36*  CALCIUM 8.5*  GLUCOSE 224*   Lab Results  Component Value Date   TROPONINI <0.03 12/01/2015   No results found for: CHOL No results found for: HDL No results found for: LDLCALC No results found for: TRIG No results found for: CHOLHDL No results found for: LDLDIRECT    Radiology: CARDIAC CATHETERIZATION  Result Date: 10/07/2019  Prox RCA lesion is 100% stenosed.  Prox LAD to Mid LAD lesion is 50% stenosed.  A drug-eluting stent was successfully placed using a STENT RESOLUTE ONYX 3.5X18.  Post intervention, there is a 0% residual stenosis.  Left ventricular function was mildly diminished at around 45 to 50% with inferior apical hypokinesis  Conclusion Mild reduced left ventricular function with inferior apical hypokinesis ejection fraction of 45 to 50% Cardiac cath with no significant epicardial disease in the left main LAD and circumflex systems 100% occlusion of proximal RCA infarct-related artery STEMI Successful PCI and stent of STEMI RCA  reducing the lesion from 100 done to 0 restoring  TIMI-3 flow from 0 deployment of DES 3.5 x 18 mm resolute Onyx to 13 atm Minx was deployed to the right femoral artery   DG Chest Portable 1 View  Result Date: 10/06/2019 CLINICAL DATA:  Chest pain EXAM: PORTABLE CHEST 1 VIEW COMPARISON:  07/17/2016 FINDINGS: Cardiac shadow is again enlarged. Pacing device is again seen and stable. The lungs are well aerated bilaterally. No focal infiltrate or sizable effusion is seen. No bony abnormality is seen. IMPRESSION: No active disease. Electronically Signed   By: Inez Catalina M.D.   On: 10/06/2019 22:46    EKG: Wide-complex rhythm possibly paced bundle branch block appearance  ASSESSMENT AND PLAN:  STEMI presentation Abnormal EKG Permanent pacemaker in place Acute coronary syndrome Obesity Hypertension Hyperlipidemia Sick sinus syndrome Recently diagnosed bladder cancer Chronic renal sufficiency stage III COPD GERD Reported underlying atrial fibrillation Bladder cancer with hematuria  Plan Status post PCI and stent STEMI presentation continue medical therapy Follow-up EKGs abnormal from STEMI presentation Acute coronary syndrome with acute myocardial infarction STEMI now improved status post DES PCI and stent Obesity recommend weight loss exercise portion control Hypertension recommend beta-blocker ACE or ARB Lipitor 80 mg once a day for statin therapy Follow-up EKGs consider pacemaker interrogation Inhalers for COPD type symptoms Recommend treatment for GERD omeprazole Protonix as needed Refer the patient to cardiac rehab upon discharge Discontinue flecainide for atrial fibrillation since patient now has developed documented coronary disease consider switching to amiodarone or sotalol Continue long-term anticoagulation with Eliquis switch from Brilinta to Plavix discontinue aspirin after 2 to 4 weeks Refer the patient back to urology for bladder cancer hematuria  Signed: Yolonda Kida MD 10/07/2019, 9:49 AM

## 2019-10-07 NOTE — H&P (Signed)
History and Physical    Joseph Houston OAC:[id number] DOB: 10-Sep-1935 DOA: 10/06/2019  PCP: Tracie Harrier, MD   Patient coming from: home I have personally briefly reviewed patient's old medical records in [place]  Chief Complaint: s/p cardiac cath for STEMI HPI: Joseph Houston is a 84 y.o. male with medical history significant for hypertension, recently diagnosed bladder cancer earlier this month, chronic A. fib on flecainide and apixaban, who presented to the emergency room early in the day with chest pain, diagnosed with a STEMI in the emergency room going immediately to the cardiac cath where he received a stent of the RCA by Dr. Clayborn Bigness.  The hospitalist service was patient.  At the time of admission to the hospitalist service, patient was chest pain-free, feeling much better since his procedure.  He denies shortness of breath, pain or swelling in the legs.  He denied cough, fever or chills.  At the time of evaluation he was afebrile with a blood pressure of 104/69, HR 60, O2 sat 99% on 2 L and he appeared comfortable  Review of Systems: As per HPI otherwise 10 point review of systems negative.    Past Medical History:  Diagnosis Date  . Anemia    was treated in the past, not now  . Anxiety   . Arthritis    knees  . BPH (benign prostatic hypertrophy)   . Chronic kidney disease    stage 3 ckd  . COPD (chronic obstructive pulmonary disease) ([place])    has had most of his life. has not used an inhaler for years  . Coronary artery disease   . Difficult intubation    pt reports bad sore throat after surgery. pt unsure of this response  . Dyspnea   . Dysrhythmia 2016   atrial fibrillation, bradycardia  . GERD (gastroesophageal reflux disease)   . Headache   . History of hiatal hernia   . History of kidney stones 04/2018  . Hypertension   . Pneumonia   . Pre-diabetes    dr. Ginette Pitman following  . Presence of permanent cardiac pacemaker 2017  . Psoriasis 2019   elbows  . Restless leg   . STEMI involving right coronary artery ([place]) 10/07/2019  . Vertigo 1992  . Yeast infection    in groin for 50 years    Past Surgical History:  Procedure Laterality Date  . CATARACT EXTRACTION W/PHACO Right 09/25/2016   Procedure: CATARACT EXTRACTION PHACO AND INTRAOCULAR LENS PLACEMENT (IOC);  Surgeon: Estill Cotta, MD;  Location: [facility];  Service: Ophthalmology;  Laterality: Right;  [place] 01:58 AP% 18.1 CDE 43.36 Fluid pack # [id number]  . CYSTOSCOPY N/A 09/28/2019   Procedure: CYSTOSCOPY;  Surgeon: Abbie Sons, MD;  Location: [facility];  Service: Urology;  Laterality: N/A;  . CYSTOSCOPY/URETEROSCOPY/HOLMIUM LASER/STENT PLACEMENT Right 04/13/2018   Procedure: CYSTOSCOPY/URETEROSCOPY/HOLMIUM LASER/STENT PLACEMENT;  Surgeon: Hollice Espy, MD;  Location: [facility];  Service: Urology;  Laterality: Right;  . ELECTROPHYSIOLOGIC STUDY N/A 12/22/2014   Procedure: CARDIOVERSION;  Surgeon: Teodoro Spray, MD;  Location: [facility];  Service: Cardiovascular;  Laterality: N/A;  . FINGER SURGERY Right 1950   index finger cut off half way  . HERNIA REPAIR Bilateral    inguinal repaired x 4  . INSERT / REPLACE / REMOVE PACEMAKER     12/17  . JOINT REPLACEMENT Right 2012   partial knee replacement  . PACEMAKER INSERTION Left 07/17/2016   Procedure: INSERTION PACEMAKER;  Surgeon: Isaias Cowman, MD;  Location:  [facility];  Service: Cardiovascular;  Laterality: Left;  . PARTIAL KNEE ARTHROPLASTY Right 2012  . TONSILLECTOMY  1950  . TOTAL KNEE ARTHROPLASTY Left 09/30/2017   Procedure: TOTAL KNEE ARTHROPLASTY;  Surgeon: Hessie Knows, MD;  Location: [facility];  Service: Orthopedics;  Laterality: Left;  . TRANSURETHRAL RESECTION OF BLADDER TUMOR N/A 09/28/2019   Procedure: TRANSURETHRAL RESECTION OF BLADDER TUMOR (TURBT);  Surgeon: Abbie Sons, MD;  Location: [facility];  Service: Urology;  Laterality: N/A;  . TRANSURETHRAL RESECTION OF PROSTATE       reports that he  quit smoking about 53 years ago. His smoking use included cigarettes. He smoked 2.00 packs per day. He has never used smokeless tobacco. He reports that he does not drink alcohol or use drugs.  Allergies  Allergen Reactions  . Mold Extract [Trichophyton] Swelling and Other (See Comments)    Tightness in throat  . Dog Epithelium Swelling and Other (See Comments)    Also, cats too He is near the animals and does okay but allergy testing indicates that he   Is allergic to animals    . Other Swelling    Allergic to a variety of trees that surround his home. Allergy testing indicated that  this is an allergy. Took allergy shots for many years. Stopped 1 yr ago  . Tree Extract Swelling    Allergic to a variety of trees that surround his home. Allergy testing indicated that  this is an allergy.  Took allergy shots for many years. Stopped 1 yr ago  . Codeine Nausea And Vomiting  . Sulfa Antibiotics Nausea And Vomiting    History reviewed. No pertinent family history.   Prior to Admission medications   Medication Sig Start Date End Date Taking? Authorizing Provider  acetaminophen (TYLENOL) 650 MG CR tablet Take 1,300 mg by mouth every 8 (eight) hours as needed for pain.    [provider]  amLODipine (NORVASC) 5 MG tablet Take 1 tablet (5 mg total) by mouth daily. 07/18/16   Clabe Seal, PA-C  apixaban (ELIQUIS) 5 MG TABS tablet TAKE 1 TABLET BY MOUTH EVERY 12 HOURS 09/20/19   [provider]  aspirin EC 81 MG tablet Take 81 mg by mouth daily.    [provider]  ciprofloxacin (CIPRO) 250 MG tablet Take 1 tablet (250 mg total) by mouth 2 (two) times daily for 7 days. 10/04/19 10/11/19  Debroah Loop, PA-C  cyanocobalamin 1000 MCG tablet Take 1,000 mcg by mouth daily.     [provider]  flecainide (TAMBOCOR) 100 MG tablet Take 100 mg by mouth 2 (two) times daily.    [provider]  HYDROcodone-acetaminophen (NORCO/VICODIN) 5-325 MG tablet  Take 1 tablet by mouth every 6 (six) hours as needed for moderate pain. 09/28/19   Stoioff, Ronda Fairly, MD  losartan (COZAAR) 100 MG tablet Take 100 mg by mouth at bedtime.     [provider]  memantine (NAMENDA) 10 MG tablet Take by mouth 2 (two) times daily.  10/20/18 10/20/19  [provider]  metoprolol tartrate (LOPRESSOR) 25 MG tablet Take 25 mg by mouth 2 (two) times daily.    [provider]  mirtazapine (REMERON) 15 MG tablet Take 15 mg by mouth at bedtime. 10/06/19   [provider]  Omega-3 Fatty Acids (FISH OIL PO) Take 1 capsule by mouth 2 (two) times daily.    [provider]  omeprazole (PRILOSEC) 20 MG capsule 2 (two) times daily before a meal.  12/15/17   [provider]  sildenafil (REVATIO) 20 MG tablet Take 20 mg by mouth.  10/09/18   [provider]    Physical Exam: Vitals:   10/06/19 2235 10/07/19 0016 10/07/19 0100 10/07/19 0200  BP: 118/68 135/71 100/60 104/69  Pulse: 60 78 61 60  Resp: _0 Temp:  99.8 F (37.7 C)    TempSrc:  Oral    SpO2: 100% 96% 100% 99%  Weight:         Vitals:   10/06/19 2235 10/07/19 0016 10/07/19 0100 10/07/19 0200  BP: 118/68 135/71 100/60 104/69  Pulse: 60 78 61 60  Resp: _1 Temp:  99.8 F (37.7 C)    TempSrc:  Oral    SpO2: 100% 96% 100% 99%  Weight:        Constitutional: Appearance,  Alert and awake, oriented x3, not in any acute distress. Eyes: PERLA, EOMI, irises appear normal, anicteric sclera,  ENMT: external ears and nose appear normal, normal hearing             Lips appears normal, oropharynx mucosa, tongue, posterior pharynx appear normal  Neck: neck appears normal, no masses, normal ROM, no thyromegaly, no JVD  CVS: S1-S2 clear, no murmur rubs or gallops, no LE edema, normal pedal pulses , no carotid bruits Respiratory:  clear to auscultation bilaterally, no wheezing, rales or rhonchi. Respiratory effort normal. No accessory muscle use.    Abdomen: soft nontender, nondistended, normal bowel sounds, no hepatosplenomegaly, no hernias Musculoskeletal: : no cyanosis, clubbing , no contractures or atroph Neuro: Cranial nerves II-XII intact, strength, sensation, reflexes Psych: judgement and insight appear normal, stable mood and affect,  Skin: no rashes or lesions or ulcers, no induration or nodules   Labs on Admission: I have personally reviewed following labs and imaging studies  CBC: Recent Labs  Lab 10/06/19 2153  WBC 10.4  HGB 13.8  HCT 42.9  MCV 92.9  PLT 366   Basic Metabolic Panel: Recent Labs  Lab 10/06/19 2153  NA 137  K 3.7  CL 104  CO2 23  GLUCOSE 145*  BUN 32*  CREATININE 1.62*  CALCIUM 9.2   GFR: Estimated Creatinine Clearance: 44.3 mL/min (A) (by C-G formula based on SCr of 1.62 mg/dL (H)). Liver Function Tests: No results for input(s): AST, ALT, ALKPHOS, BILITOT, PROT, ALBUMIN in the last 168 hours. No results for input(s): LIPASE, AMYLASE in the last 168 hours. No results for input(s): AMMONIA in the last 168 hours. Coagulation Profile: Recent Labs  Lab 10/06/19 2153  INR 1.4*   Cardiac Enzymes: No results for input(s): CKTOTAL, CKMB, CKMBINDEX, TROPONINI in the last 168 hours. BNP (last 3 results) No results for input(s): PROBNP in the last 8760 hours. HbA1C: No results for input(s): HGBA1C in the last 72 hours. CBG: Recent Labs  Lab 10/07/19 0016  GLUCAP 131*   Lipid Profile: No results for input(s): CHOL, HDL, LDLCALC, TRIG, CHOLHDL, LDLDIRECT in the last 72 hours. Thyroid Function Tests: No results for input(s): TSH, T4TOTAL, FREET4, T3FREE, THYROIDAB in the last 72 hours. Anemia Panel: No results for input(s): VITAMINB12, FOLATE, FERRITIN, TIBC, IRON, RETICCTPCT in the last 72 hours. Urine analysis:    Component Value Date/Time   COLORURINE YELLOW (A) 10/03/2019 2040   APPEARANCEUR Cloudy (A) 10/04/2019 1045   LABSPEC 1.021 10/03/2019 2040   PHURINE 5.0 10/03/2019  2040   GLUCOSEU Negative 10/04/2019 1045   HGBUR LARGE (A) 10/03/2019 2040  BILIRUBINUR Negative 10/04/2019 1045   KETONESUR 5 (A) 10/03/2019 2040   PROTEINUR 3+ (A) 10/04/2019 1045   PROTEINUR 100 (A) 10/03/2019 2040   NITRITE Negative 10/04/2019 1045   NITRITE NEGATIVE 10/03/2019 2040   LEUKOCYTESUR 1+ (A) 10/04/2019 1045   LEUKOCYTESUR MODERATE (A) 10/03/2019 2040    Radiological Exams on Admission: CARDIAC CATHETERIZATION  Result Date: 10/07/2019  Prox RCA lesion is 100% stenosed.  Prox LAD to Mid LAD lesion is 50% stenosed.  A drug-eluting stent was successfully placed using a STENT RESOLUTE ONYX 3.5X18.  Post intervention, there is a 0% residual stenosis.  Left ventricular function was mildly diminished at around 45 to 50% with inferior apical hypokinesis  Conclusion Mild reduced left ventricular function with inferior apical hypokinesis ejection fraction of 45 to 50% Cardiac cath with no significant epicardial disease in the left main LAD and circumflex systems 100% occlusion of proximal RCA infarct-related artery STEMI Successful PCI and stent of STEMI RCA reducing the lesion from 100 done to 0 restoring TIMI-3 flow from 0 deployment of DES 3.5 x 18 mm resolute Onyx to 13 atm Minx was deployed to the right femoral artery   DG Chest Portable 1 View  Result Date: 10/06/2019 CLINICAL DATA:  Chest pain EXAM: PORTABLE CHEST 1 VIEW COMPARISON:  07/17/2016 FINDINGS: Cardiac shadow is again enlarged. Pacing device is again seen and stable. The lungs are well aerated bilaterally. No focal infiltrate or sizable effusion is seen. No bony abnormality is seen. IMPRESSION: No active disease. Electronically Signed   By: Inez Catalina M.D.   On: 10/06/2019 22:46    Assessment/Plan Principal Problem:   STEMI involving right coronary artery [facility]) status post cardiac cath -Patient doing well post cath, chest pain-free -Continue current orders per cardiology -Cardiology consult placed for  continued post STEMI management    A-fib [facility]) Chronic anticoagulation -We will hold Eliquis at this time, which was recently on hold for several days for recent cystoscopy -Continue flecainide once med rec completed  BP (high blood pressure) -Continue metoprolol and losartan    Bladder carcinoma ([place]) -Diagnosed earlier this month. -Eliquis had been held due to gross hematuria prior to cystoscopy and was restarted the day prior to STEMI      DVT prophylaxis:scd Code Status: full code  Family Communication:  none  Disposition Plan: Back to previous home environment Consults called: cardiology  Status:inp    Athena Masse MD [facility]     10/07/2019, 2:31 AM

## 2019-10-07 NOTE — Progress Notes (Signed)
Patient complains of bloody urine (likely from bladder cancer).  This is nothing new and he has had it before.  No chest pain, shortness of breath, palpitations or dizziness.  Vital signs are stable.  Continue current treatment.  He is deemed stable for transfer from stepdown unit to telemetry.  Plan of care was discussed with patient and his wife at the bedside.

## 2019-10-08 DIAGNOSIS — I10 Essential (primary) hypertension: Secondary | ICD-10-CM

## 2019-10-08 DIAGNOSIS — Z7901 Long term (current) use of anticoagulants: Secondary | ICD-10-CM

## 2019-10-08 MED ORDER — METOPROLOL TARTRATE 50 MG PO TABS
50.0000 mg | ORAL_TABLET | Freq: Two times a day (BID) | ORAL | Status: DC
  Administered 2019-10-08 – 2019-10-09 (×2): 50 mg via ORAL
  Filled 2019-10-08 (×2): qty 1

## 2019-10-08 MED ORDER — APIXABAN 5 MG PO TABS
5.0000 mg | ORAL_TABLET | Freq: Two times a day (BID) | ORAL | Status: DC
  Administered 2019-10-08 – 2019-10-09 (×2): 5 mg via ORAL
  Filled 2019-10-08 (×2): qty 1

## 2019-10-08 MED ORDER — CLOPIDOGREL BISULFATE 75 MG PO TABS
75.0000 mg | ORAL_TABLET | Freq: Every day | ORAL | Status: DC
  Administered 2019-10-09: 75 mg via ORAL
  Filled 2019-10-08: qty 1

## 2019-10-08 MED ORDER — MUPIROCIN 2 % EX OINT
1.0000 "application " | TOPICAL_OINTMENT | Freq: Two times a day (BID) | CUTANEOUS | Status: DC
  Filled 2019-10-08: qty 22

## 2019-10-08 MED ORDER — AMIODARONE HCL 200 MG PO TABS
200.0000 mg | ORAL_TABLET | Freq: Every day | ORAL | Status: DC
  Administered 2019-10-08 – 2019-10-09 (×2): 200 mg via ORAL
  Filled 2019-10-08 (×2): qty 1

## 2019-10-08 MED ORDER — CLOPIDOGREL BISULFATE 75 MG PO TABS
300.0000 mg | ORAL_TABLET | Freq: Once | ORAL | Status: AC
  Administered 2019-10-08: 300 mg via ORAL
  Filled 2019-10-08: qty 4

## 2019-10-08 NOTE — Evaluation (Signed)
Occupational Therapy Evaluation Patient Details Name: Joseph Houston MRN: [id number] DOB: 12-12-1935 Today's Date: 10/08/2019    History of Present Illness Pt is an 84 yo male post op STEMI of RCA with PCI And stent with DES. PMH of bladder carcinoma, afib, HOH, pacemaker   Clinical Impression   Patient seen for OT evaluation this date following STEMI.  Patient sitting up in bed with his wife upon arrival and agreeable to session.  Alert and oriented x 4.  Patient demonstrates good self pacing skills and safety awareness.  Completed functional transfers at MOD I.  MOD I performing standing ADLs at sink.  Currently recommending SPV for bathing tasks in order to monitor heart rate.  No strength deficits noted.  Patient able to perform LB dressing at MOD I.  Provided education to patient and family on use of energy conservation techniques, compensatory techniques and adaptive equipment to improve safety and independence.  Based on today's performance, evaluation only and no follow up OT recommended.        Follow Up Recommendations  No OT follow up    Equipment Recommendations  None recommended by OT    Recommendations for Other Services       Precautions / Restrictions Precautions Precautions: Fall;ICD/Pacemaker Precaution Comments: low fall risk, watch HR with activity Restrictions Weight Bearing Restrictions: No      Mobility Bed Mobility Overal bed mobility: Modified Independent                Transfers Overall transfer level: Modified independent Equipment used: Rolling walker (2 wheeled)             General transfer comment: Able to perform sit to stand without RW at MOD I    Balance Overall balance assessment: Needs assistance Sitting-balance support: Feet supported Sitting balance-Leahy Scale: Good     Standing balance support: No upper extremity supported;During functional activity Standing balance-Leahy Scale: Good Standing balance comment: Able to  perform self care tasks standing at sink safely                           ADL either performed or assessed with clinical judgement   ADL Overall ADL's : Needs assistance/impaired         Upper Body Bathing: Supervision/ safety;Standing   Lower Body Bathing: Supervison/ safety;Sit to/from stand       Lower Body Dressing: Sit to/from stand;Modified independent   Toilet Transfer: Modified Independent   Toileting- Clothing Manipulation and Hygiene: Modified independent       Functional mobility during ADLs: Modified independent General ADL Comments: Demonstrates good safety awareness and self pacing. Recommending SBA/SPV for bathing at this time to monitor HR.     Vision Patient Visual Report: No change from baseline       Perception     Praxis      Pertinent Vitals/Pain Pain Assessment: No/denies pain(Some discomfort in abdomen.  Wife stated he typically  has this specific pain due to his cancer.)     Hand Dominance Right   Extremity/Trunk Assessment Upper Extremity Assessment Upper Extremity Assessment: Overall WFL for tasks assessed   Lower Extremity Assessment Lower Extremity Assessment: Defer to PT evaluation   Cervical / Trunk Assessment Cervical / Trunk Assessment: Normal   Communication Communication Communication: HOH   Cognition Arousal/Alertness: Awake/alert Behavior During Therapy: WFL for tasks assessed/performed Overall Cognitive Status: Within Functional Limits for tasks assessed  General Comments: A&Ox 4 pleasant and cooperative   General Comments       Exercises Other Exercises Other Exercises: Educated patient/wife on goals of OT within acute care Other Exercises: Educated patient on use of compensatory techniques for LB dressing and bathing Other Exercises: Educated patient on use of AE to perform LB dressing and bathing   Shoulder Instructions      Home Living  Family/patient expects to be discharged to:: Private residence Living Arrangements: Spouse/significant other Available Help at Discharge: Family Type of Home: House Home Access: Stairs to enter Technical brewer of Steps: 4 Entrance Stairs-Rails: Left;Right;Can reach both [place]: One level     Bathroom Shower/Tub: Teacher, early years/pre: Standard Bathroom Accessibility: No   Home Equipment: Environmental consultant - 2 wheels;Cane - single point          Prior Functioning/Environment Level of Independence: Independent        Comments: Wife states she would occasionally help with dressing or bathing, but more often than not, he performed all ADLs I. Pt reports performing functional mobility without DME.        OT Problem List:        OT Treatment/Interventions:      OT Goals(Current goals can be found in the care plan section) Acute Rehab OT Goals Patient Stated Goal: "Get back home" OT Goal Formulation: With patient/family Time For Goal Achievement: 10/22/19 Potential to Achieve Goals: Good  OT Frequency:     Barriers to D/C:            Co-evaluation              AM-PAC OT "6 Clicks" Daily Activity     Outcome Measure Help from another person eating meals?: None Help from another person taking care of personal grooming?: None Help from another person toileting, which includes using toliet, bedpan, or urinal?: None Help from another person bathing (including washing, rinsing, drying)?: A Little Help from another person to put on and taking off regular upper body clothing?: None Help from another person to put on and taking off regular lower body clothing?: None 6 Click Score: 23   End of Session Equipment Utilized During Treatment: Rolling walker  Activity Tolerance: Patient tolerated treatment well;No increased pain Patient left: in bed;with call bell/phone within reach;with bed alarm set;with family/visitor present  OT Visit Diagnosis: Other  (comment)(STEMI, cardiopulmonary)                Time: [id number] OT Time Calculation (min): 16 min Charges:  OT General Charges $OT Visit: 1 Visit OT Evaluation $OT Eval Low Complexity: 1 Low OT Treatments $Self Care/Home Management : 8-22 mins $Therapeutic Activity: 8-22 mins  Baldomero Lamy, MS, OTR/L 10/08/19, 5:12 PM

## 2019-10-08 NOTE — Progress Notes (Signed)
[facility] Cardiology    SUBJECTIVE:  Patient states to be doing much better no chest pain no shortness of breath no bleeding.  Hematuria is resolved feels reasonably well is had been transferred to telemetry and now ambulating in the halls is no groin issues should be ready to be discharged tomorrow   Vitals:   10/07/19 2052 10/08/19 0340 10/08/19 0818 10/08/19 1157  BP: (!) 147/83 115/70 123/68 102/65  Pulse: 63 61 64 62  Resp: _0 Temp: (!) 97.5 F (36.4 C) 98.3 F (36.8 C) 97.7 F (36.5 C) 97.9 F (36.6 C)  TempSrc: Oral Oral Oral Oral  SpO2: 99% 100% 100% 98%  Weight:      Height:         Intake/Output Summary (Last 24 hours) at 10/08/2019 1501 Last data filed at 10/08/2019 1330 Gross per 24 hour  Intake 1320 ml  Output 1950 ml  Net -630 ml      PHYSICAL EXAM  General: Well developed, well nourished, in no acute distress HEENT:  Normocephalic and atramatic Neck:  No JVD.  Lungs: Clear bilaterally to auscultation and percussion. Heart: HRRR . Normal S1 and S2 without gallops or murmurs.  Abdomen: Bowel sounds are positive, abdomen soft and non-tender  Msk:  Back normal, normal gait. Normal strength and tone for age. Extremities: No clubbing, cyanosis or edema.  Right groin area stable no bleeding no hematoma. Normal pulses  Neuro: Alert and oriented X 3. Psych:  Good affect, responds appropriately   LABS: Basic Metabolic Panel: Recent Labs    10/06/19 2153 10/07/19 0250  NA 137 136  K 3.7 4.5  CL 104 104  CO2 23 26  GLUCOSE 145* 224*  BUN 32* 31*  CREATININE 1.62* 1.36*  CALCIUM 9.2 8.5*   Liver Function Tests: No results for input(s): AST, ALT, ALKPHOS, BILITOT, PROT, ALBUMIN in the last 72 hours. No results for input(s): LIPASE, AMYLASE in the last 72 hours. CBC: Recent Labs    10/06/19 2153 10/07/19 0250  WBC 10.4 9.5  HGB 13.8 12.7*  HCT 42.9 40.2  MCV 92.9 94.1  PLT 225 166   Cardiac Enzymes: No results for input(s): CKTOTAL, CKMB,  CKMBINDEX, TROPONINI in the last 72 hours. BNP: Invalid input(s): POCBNP D-Dimer: No results for input(s): DDIMER in the last 72 hours. Hemoglobin A1C: No results for input(s): HGBA1C in the last 72 hours. Fasting Lipid Panel: No results for input(s): CHOL, HDL, LDLCALC, TRIG, CHOLHDL, LDLDIRECT in the last 72 hours. Thyroid Function Tests: No results for input(s): TSH, T4TOTAL, T3FREE, THYROIDAB in the last 72 hours.  Invalid input(s): FREET3 Anemia Panel: No results for input(s): VITAMINB12, FOLATE, FERRITIN, TIBC, IRON, RETICCTPCT in the last 72 hours.  CARDIAC CATHETERIZATION  Result Date: 10/07/2019  Prox RCA lesion is 100% stenosed.  Prox LAD to Mid LAD lesion is 50% stenosed.  A drug-eluting stent was successfully placed using a STENT RESOLUTE ONYX 3.5X18.  Post intervention, there is a 0% residual stenosis.  Left ventricular function was mildly diminished at around 45 to 50% with inferior apical hypokinesis  Conclusion Mild reduced left ventricular function with inferior apical hypokinesis ejection fraction of 45 to 50% Cardiac cath with no significant epicardial disease in the left main LAD and circumflex systems 100% occlusion of proximal RCA infarct-related artery STEMI Successful PCI and stent of STEMI RCA reducing the lesion from 100 done to 0 restoring TIMI-3 flow from 0 deployment of DES 3.5 x 18 mm resolute Onyx to  13 atm Minx was deployed to the right femoral artery   DG Chest Portable 1 View  Result Date: 10/06/2019 CLINICAL DATA:  Chest pain EXAM: PORTABLE CHEST 1 VIEW COMPARISON:  07/17/2016 FINDINGS: Cardiac shadow is again enlarged. Pacing device is again seen and stable. The lungs are well aerated bilaterally. No focal infiltrate or sizable effusion is seen. No bony abnormality is seen. IMPRESSION: No active disease. Electronically Signed   By: Inez Catalina M.D.   On: 10/06/2019 22:46     Echo   pending  TELEMETRY:   paced rhythm nonspecific ST T-wave changes:  possible atrial fibrillation  ASSESSMENT AND PLAN:  Principal Problem:   STEMI involving right coronary artery (HCC) Active Problems:   A-fib (HCC)   BP (high blood pressure)   Chronic anticoagulation   Bladder carcinoma (HCC)   STEMI (ST elevation myocardial infarction) ([place])    1.  Postop day 2 after STEMI  status post PCI and stent 100% occluded proximal RCA with DES stent  coronary artery disease  atrial fibrillation  bladder cancer with hematuria   permanent pacemaker in place  hypertension  obesity  hyperlipidemia  possible obstructive sleep apnea  AFib .  plan  agree with current therapy postop  continue aspirin Brilinta post DES stent hopefully for at least 12 months  bladder cancer with hematuria will have to consider interrupting antiplatelet therapy if bladder procedures indicated  continue hypertension management ACE-inhibitor beta-blocker primarily  discontinue flecainide because he has developed coronary disease will consider switching to amiodarone to treat atrial fibrillation rhythm management  will consider resuming Eliquis but will probably have to discontinue aspirin  will probably switch Brilinta to Plavix 75 mg once a day  I recommend discontinuing amlodipine in favor of increase in Lopressor  consider discharge in the morning  follow-up cardiology 1-2 weeks     Yolonda Kida, MD, 10/08/2019 3:01 PM

## 2019-10-08 NOTE — Evaluation (Signed)
Physical Therapy Evaluation Patient Details Name: Joseph Houston MRN: [id number] DOB: 1936/01/04 Today's Date: 10/08/2019   History of Present Illness  Pt is an 84 yo male post op STEMI of RCA with PCI And stent with DES. PMH of bladder carcinoma, afib, HOH, pacemaker    Clinical Impression  Pt alert, oriented, up in bathroom at start of session. Pt stated prior to admission, independent at baseline, lives with wife.   Pt ambulated >177ft with RW and modI, exhibited decreased gait velocity and 1-2 standing rest breaks noted, but overall steady and safe. Pt returned to bed mod I. Overall the patient demonstrated mild deficits in activity tolerance/endurance and functional mobility (previously did not use an AD at baseline) and would benefit from further conditioning. Recommendation is cardiac rehab, but if unable to obtain a cardiac rehab consult, pt appropriate for outpatient PT.       Follow Up Recommendations Other (comment)(Pt appropriate for cardiac rehab. If unable to go to cardiac rehab, recommend outpatient PT.)    Equipment Recommendations  None recommended by PT    Recommendations for Other Services       Precautions / Restrictions Precautions Precautions: Fall;ICD/Pacemaker Precaution Comments: low fall risk, watch HR Restrictions Weight Bearing Restrictions: No      Mobility  Bed Mobility Overal bed mobility: Modified Independent                Transfers Overall transfer level: Modified independent Equipment used: Rolling walker (2 wheeled)                Ambulation/Gait   Gait Distance (Feet): 190 Feet Assistive device: Rolling walker (2 wheeled)       General Gait Details: WFLs, decreased gait velocity noted, but overall safe, steady  Stairs            Wheelchair Mobility    Modified Rankin (Stroke Patients Only)       Balance Overall balance assessment: Needs assistance Sitting-balance support: Feet supported Sitting  balance-Leahy Scale: Good       Standing balance-Leahy Scale: Good                               Pertinent Vitals/Pain Pain Assessment: No/denies pain    Home Living Family/patient expects to be discharged to:: Private residence Living Arrangements: Spouse/significant other Available Help at Discharge: Family Type of Home: House Home Access: Stairs to enter Entrance Stairs-Rails: Left;Right;Can reach both Technical brewer of Steps: 4 Home Layout: One level Home Equipment: Environmental consultant - 2 wheels;Cane - single point      Prior Function Level of Independence: Independent               Hand Dominance        Extremity/Trunk Assessment   Upper Extremity Assessment Upper Extremity Assessment: Overall WFL for tasks assessed    Lower Extremity Assessment Lower Extremity Assessment: Overall WFL for tasks assessed    Cervical / Trunk Assessment Cervical / Trunk Assessment: Normal  Communication   Communication: HOH  Cognition Arousal/Alertness: Awake/alert Behavior During Therapy: WFL for tasks assessed/performed Overall Cognitive Status: Within Functional Limits for tasks assessed                                        General Comments      Exercises Other Exercises Other Exercises: Pt up  in bathroom at start of session with RW.   Assessment/Plan    PT Assessment Patient needs continued PT services  PT Problem List Decreased activity tolerance;Decreased balance;Decreased mobility       PT Treatment Interventions DME instruction;Balance training;Gait training;Neuromuscular re-education;Stair training;Functional mobility training;Patient/family education;Therapeutic activities;Therapeutic exercise    PT Goals (Current goals can be found in the Care Plan section)  Acute Rehab PT Goals Patient Stated Goal: to get back to PLOF PT Goal Formulation: With patient Time For Goal Achievement: 10/22/19 Potential to Achieve Goals:  Good    Frequency Min 2X/week   Barriers to discharge        Co-evaluation               AM-PAC PT "6 Clicks" Mobility  Outcome Measure Help needed turning from your back to your side while in a flat bed without using bedrails?: None Help needed moving from lying on your back to sitting on the side of a flat bed without using bedrails?: None Help needed moving to and from a bed to a chair (including a wheelchair)?: None Help needed standing up from a chair using your arms (e.g., wheelchair or bedside chair)?: None Help needed to walk in hospital room?: None Help needed climbing 3-5 steps with a railing? : None 6 Click Score: 24    End of Session   Activity Tolerance: Patient tolerated treatment well Patient left: in bed;with call bell/phone within reach;with family/visitor present Nurse Communication: Mobility status PT Visit Diagnosis: Other abnormalities of gait and mobility (R26.89)    Time: [id number] PT Time Calculation (min) (ACUTE ONLY): 11 min   Charges:   PT Evaluation $PT Eval Low Complexity: 1 Low          Lieutenant Diego PT, DPT 3:07 PM,10/08/19

## 2019-10-08 NOTE — Progress Notes (Signed)
Progress Note    Joseph Houston  VWP:[id number] DOB: [birth year]  DOA: 10/06/2019 PCP: Tracie Harrier, MD      Brief Narrative:    Medical records reviewed and are as summarized below:  Joseph Houston is an 84 y.o. male  with medical history significant for hypertension, CKD stage IIIa, recently diagnosed bladder cancer earlier this month, chronic A. fib on flecainide and apixaban, who presented to the emergency room with chest pain.  His troponins were elevated (increased to greater than 27,000).  He was diagnosed with STEMI in the emergency room and he had emergent cardiac cath where he received a stent of the RCA by Dr. Clayborn Bigness.  The hospitalist team was consulted to continue with medical management.      Assessment/Plan:   Principal Problem:   STEMI involving right coronary artery (HCC) Active Problems:   A-fib (HCC)   BP (high blood pressure)   Chronic anticoagulation   Bladder carcinoma (HCC)   STEMI (ST elevation myocardial infarction) ([place])   Acute STEMI: S/p left heart cath with stent to the RCA on 10/07/2019.  Patient is on aspirin, Plavix, losartan, metoprolol and Lipitor.  PT and OT.  Follow-up with cardiologist.  Chronic atrial fibrillation: Cardiologist has restarted Eliquis for stroke prophylaxis and change flecainide to amiodarone.  Recently diagnosed bladder cancer with hematuria: Outpatient follow-up with urologist.  Hypertension: Continue antihypertensives.  Amlodipine has been discontinued and metoprolol dose has been increased.  CKD stage IIIb: Creatinine is stable   Body mass index is 33.61 kg/m.  (Obesity)   Family Communication/Anticipated D/C date and plan/Code Status   DVT prophylaxis: Eliquis Code Status: Full code Family Communication: Plan discussed with patient Disposition Plan: Patient is from home.  Plan to discharge home tomorrow when cleared by cardiologist.      Subjective:   No chest pain or shortness of  breath.  No hematuria today.  He had bloody urine yesterday but none today.  Objective:    Vitals:   10/08/19 0340 10/08/19 0818 10/08/19 1157 10/08/19 1526  BP: 115/70 123/68 102/65 108/69  Pulse: 61 64 62 61  Resp: '16 19 18 19  ' Temp: 98.3 F (36.8 C) 97.7 F (36.5 C) 97.9 F (36.6 C) 98.1 F (36.7 C)  TempSrc: Oral Oral Oral Oral  SpO2: 100% 100% 98% 99%  Weight:      Height:        Intake/Output Summary (Last 24 hours) at 10/08/2019 1600 Last data filed at 10/08/2019 1330 Gross per 24 hour  Intake 1080 ml  Output 1850 ml  Net -770 ml   Filed Weights   10/06/19 2203 10/07/19 0434 10/07/19 1629  Weight: 109.3 kg 106.5 kg 109.3 kg    Exam:  GEN: NAD SKIN: No rash EYES: EOMI ENT: MMM CV: RRR PULM: CTA B ABD: soft, ND, NT, +BS CNS: AAO x 3, non focal EXT: No edema or tenderness   Data Reviewed:   I have personally reviewed following labs and imaging studies:  Labs: Labs show the following:   Basic Metabolic Panel: Recent Labs  Lab 10/06/19 2153 10/07/19 0250  NA 137 136  K 3.7 4.5  CL 104 104  CO2 23 26  GLUCOSE 145* 224*  BUN 32* 31*  CREATININE 1.62* 1.36*  CALCIUM 9.2 8.5*   GFR Estimated Creatinine Clearance: 53.6 mL/min (A) (by C-G formula based on SCr of 1.36 mg/dL (H)). Liver Function Tests: No results for input(s): AST, ALT, ALKPHOS, BILITOT, PROT, ALBUMIN  in the last 168 hours. No results for input(s): LIPASE, AMYLASE in the last 168 hours. No results for input(s): AMMONIA in the last 168 hours. Coagulation profile Recent Labs  Lab 10/06/19 2153  INR 1.4*    CBC: Recent Labs  Lab 10/06/19 2153 10/07/19 0250  WBC 10.4 9.5  HGB 13.8 12.7*  HCT 42.9 40.2  MCV 92.9 94.1  PLT 225 166   Cardiac Enzymes: No results for input(s): CKTOTAL, CKMB, CKMBINDEX, TROPONINI in the last 168 hours. BNP (last 3 results) No results for input(s): PROBNP in the last 8760 hours. CBG: Recent Labs  Lab 10/07/19 0016  GLUCAP 131*   D-Dimer:  No results for input(s): DDIMER in the last 72 hours. Hgb A1c: No results for input(s): HGBA1C in the last 72 hours. Lipid Profile: No results for input(s): CHOL, HDL, LDLCALC, TRIG, CHOLHDL, LDLDIRECT in the last 72 hours. Thyroid function studies: No results for input(s): TSH, T4TOTAL, T3FREE, THYROIDAB in the last 72 hours.  Invalid input(s): FREET3 Anemia work up: No results for input(s): VITAMINB12, FOLATE, FERRITIN, TIBC, IRON, RETICCTPCT in the last 72 hours. Sepsis Labs: Recent Labs  Lab 10/06/19 2153 10/07/19 0250  WBC 10.4 9.5    Microbiology Recent Results (from the past 240 hour(s))  Microscopic Examination     Status: Abnormal   Collection Time: 10/04/19 10:45 AM   URINE  Result Value Ref Range Status   WBC, UA 11-30 (A) 0 - 5 /hpf Final   RBC >30 (A) 0 - 2 /hpf Final   Epithelial Cells (non renal) 0-10 0 - 10 /hpf Final   Casts Present (A) None seen /lpf Final   Cast Type Hyaline casts N/A Final   Bacteria, UA Few None seen/Few Final  CULTURE, URINE COMPREHENSIVE     Status: None   Collection Time: 10/04/19 11:05 AM   Specimen: Urine   UR  Result Value Ref Range Status   Urine Culture, Comprehensive Final report  Final   Organism ID, Bacteria Comment  Final    Comment: No growth in 36 - 48 hours.  Respiratory Panel by RT PCR (Flu A&B, Covid) - Nasopharyngeal Swab     Status: None   Collection Time: 10/06/19  9:53 PM   Specimen: Nasopharyngeal Swab  Result Value Ref Range Status   SARS Coronavirus 2 by RT PCR NEGATIVE NEGATIVE Final    Comment: (NOTE) SARS-CoV-2 target nucleic acids are NOT DETECTED. The SARS-CoV-2 RNA is generally detectable in upper respiratoy specimens during the acute phase of infection. The lowest concentration of SARS-CoV-2 viral copies this assay can detect is 131 copies/mL. A negative result does not preclude SARS-Cov-2 infection and should not be used as the sole basis for treatment or other patient management decisions. A  negative result may occur with  improper specimen collection/handling, submission of specimen other than nasopharyngeal swab, presence of viral mutation(s) within the areas targeted by this assay, and inadequate number of viral copies (<131 copies/mL). A negative result must be combined with clinical observations, patient history, and epidemiological information. The expected result is Negative. Fact Sheet for Patients:  PinkCheek.be Fact Sheet for Healthcare Providers:  GravelBags.it This test is not yet ap proved or cleared by the [place] FDA and  has been authorized for detection and/or diagnosis of SARS-CoV-2 by FDA under an Emergency Use Authorization (EUA). This EUA will remain  in effect (meaning this test can be used) for the duration of the COVID-19 declaration under Section 564(b)(1) of the Act, 21  U.S.C. section 360bbb-3(b)(1), unless the authorization is terminated or revoked sooner.    Influenza A by PCR NEGATIVE NEGATIVE Final   Influenza B by PCR NEGATIVE NEGATIVE Final    Comment: (NOTE) The Xpert Xpress SARS-CoV-2/FLU/RSV assay is intended as an aid in  the diagnosis of influenza from Nasopharyngeal swab specimens and  should not be used as a sole basis for treatment. Nasal washings and  aspirates are unacceptable for Xpert Xpress SARS-CoV-2/FLU/RSV  testing. Fact Sheet for Patients: PinkCheek.be Fact Sheet for Healthcare Providers: GravelBags.it This test is not yet approved or cleared by the [place] FDA and  has been authorized for detection and/or diagnosis of SARS-CoV-2 by  FDA under an Emergency Use Authorization (EUA). This EUA will remain  in effect (meaning this test can be used) for the duration of the  Covid-19 declaration under Section 564(b)(1) of the Act, 21  U.S.C. section 360bbb-3(b)(1), unless the authorization is   terminated or revoked. Performed at [facility], [address] [place], [place] [address]   MRSA PCR Screening     Status: Abnormal   Collection Time: 10/07/19  1:22 AM   Specimen: Nasopharyngeal  Result Value Ref Range Status   MRSA by PCR POSITIVE (A) NEGATIVE Final    Comment:        The GeneXpert MRSA Assay (FDA approved for NASAL specimens only), is one component of a comprehensive MRSA colonization surveillance program. It is not intended to diagnose MRSA infection nor to guide or monitor treatment for MRSA infections. RESULT CALLED TO, READ BACK BY AND VERIFIED WITH: Aron Baba RN [phone] 10/07/19 HNM Performed at [facility], [address] [place], [place] [address]     Procedures and diagnostic studies:  CARDIAC CATHETERIZATION  Result Date: 10/07/2019  Prox RCA lesion is 100% stenosed.  Prox LAD to Mid LAD lesion is 50% stenosed.  A drug-eluting stent was successfully placed using a STENT RESOLUTE ONYX 3.5X18.  Post intervention, there is a 0% residual stenosis.  Left ventricular function was mildly diminished at around 45 to 50% with inferior apical hypokinesis  Conclusion Mild reduced left ventricular function with inferior apical hypokinesis ejection fraction of 45 to 50% Cardiac cath with no significant epicardial disease in the left main LAD and circumflex systems 100% occlusion of proximal RCA infarct-related artery STEMI Successful PCI and stent of STEMI RCA reducing the lesion from 100 done to 0 restoring TIMI-3 flow from 0 deployment of DES 3.5 x 18 mm resolute Onyx to 13 atm Minx was deployed to the right femoral artery   DG Chest Portable 1 View  Result Date: 10/06/2019 CLINICAL DATA:  Chest pain EXAM: PORTABLE CHEST 1 VIEW COMPARISON:  07/17/2016 FINDINGS: Cardiac shadow is again enlarged. Pacing device is again seen and stable. The lungs are well aerated bilaterally. No focal infiltrate or sizable effusion is seen. No bony  abnormality is seen. IMPRESSION: No active disease. Electronically Signed   By: Inez Catalina M.D.   On: 10/06/2019 22:46    Medications:   . amiodarone  200 mg Oral Daily  . apixaban  5 mg Oral BID  . aspirin  81 mg Oral Daily  . atorvastatin  80 mg Oral q1800  . Chlorhexidine Gluconate Cloth  6 each Topical Daily  . clopidogrel  300 mg Oral Once  . [START ON 10/09/2019] clopidogrel  75 mg Oral Daily  . losartan  100 mg Oral Daily  . memantine  10 mg Oral BID  . metoprolol  tartrate  50 mg Oral BID  . mirtazapine  15 mg Oral QHS  . mupirocin ointment  1 application Nasal BID  . pantoprazole  40 mg Oral Daily  . sodium chloride flush  3 mL Intravenous Q12H  . cyanocobalamin  1,000 mcg Oral Daily   Continuous Infusions: . sodium chloride       LOS: 1 day   Hendrix Yurkovich  [facility]     10/08/2019, 4:00 PM

## 2019-10-09 MED ORDER — METOPROLOL TARTRATE 50 MG PO TABS: 50.0000 mg | ORAL_TABLET | Freq: Two times a day (BID) | ORAL | 0 refills | Status: AC

## 2019-10-09 MED ORDER — CLOPIDOGREL BISULFATE 75 MG PO TABS: 75.0000 mg | ORAL_TABLET | Freq: Every day | ORAL | 0 refills | Status: AC

## 2019-10-09 MED ORDER — ZOLPIDEM TARTRATE 5 MG PO TABS: 5.0000 mg | ORAL_TABLET | Freq: Every evening | ORAL | Status: DC | PRN

## 2019-10-09 MED ORDER — AMIODARONE HCL 200 MG PO TABS: 200.0000 mg | ORAL_TABLET | Freq: Every day | ORAL | 0 refills | Status: AC

## 2019-10-09 MED ORDER — ATORVASTATIN CALCIUM 80 MG PO TABS: 80.0000 mg | ORAL_TABLET | Freq: Every day | ORAL | 0 refills | Status: AC

## 2019-10-09 NOTE — Discharge Summary (Addendum)
Physician Discharge Summary  Joseph Houston JYN:[id number] DOB: 17-Jun-1936 DOA: 10/06/2019  PCP: Tracie Harrier, MD  Admit date: 10/06/2019 Discharge date: 10/09/2019  Discharge disposition: Home   Recommendations for Outpatient Follow-Up:   Outpatient follow-up with cardiologist, neurologist and PCP Outpatient follow-up for cardiac rehab   Discharge Diagnosis:   Principal Problem:   STEMI involving right coronary artery ([place]) Active Problems:   A-fib ([place])   BP (high blood pressure)   Chronic anticoagulation   Bladder carcinoma ([place])   STEMI (ST elevation myocardial infarction) ([place])    Discharge Condition: Stable.  Diet recommendation: Heart healthy diet  Code status: Full code.    Hospital Course:   Joseph Houston is an 84 y.o. male with medical history significant forhypertension, CKD stage IIIa, recently diagnosed bladder cancer earlier this month, chronic A. fib on flecainide and apixaban, who presented to the emergency room with chest pain.  His troponins were elevated (increased to greater than 27,000).  He was diagnosed with STEMI in the emergency room and he had emergent cardiac cath where he received a stent of the RCA by Dr. Clayborn Bigness.  The hospitalist team was consulted to continue with medical management.  He had hematuria in the hospital but this has resolved.  All his symptoms have resolved.  He has been able to ambulate with PT.  Cardiologist recommended triple antithrombotic therapy with aspirin, Plavix and Eliquis.  He also switched flecainide to amiodarone and increased metoprolol to 50 mg twice daily.  Amlodipine has been discontinued.  Discharge plan was discussed with the patient.  Discharge plan was also discussed with cardiologist, Dr. Ubaldo Glassing.   Discharge Exam:   Vitals:   10/09/19 0449 10/09/19 0746  BP: 120/75 117/82  Pulse: 63 (!) 59  Resp: 17 17  Temp: 97.8 F (36.6 C) (!) 97.5 F (36.4 C)  SpO2: 96% 98%   Vitals:   10/08/19  1526 10/08/19 2044 10/09/19 0449 10/09/19 0746  BP: 108/69 121/75 120/75 117/82  Pulse: 61 60 63 (!) 59  Resp: '19 16 17 17  ' Temp: 98.1 F (36.7 C) 98.1 F (36.7 C) 97.8 F (36.6 C) (!) 97.5 F (36.4 C)  TempSrc: Oral Oral Oral Oral  SpO2: 99% 98% 96% 98%  Weight:   105 kg   Height:         GEN: NAD SKIN: No rash EYES: EOMI ENT: MMM CV: RRR PULM: CTA B ABD: soft, ND, NT, +BS CNS: AAO x 3, non focal EXT: No edema or tenderness   The results of significant diagnostics from this hospitalization (including imaging, microbiology, ancillary and laboratory) are listed below for reference.     Procedures and Diagnostic Studies:   CARDIAC CATHETERIZATION  Result Date: 10/07/2019  Prox RCA lesion is 100% stenosed.  Prox LAD to Mid LAD lesion is 50% stenosed.  A drug-eluting stent was successfully placed using a STENT RESOLUTE ONYX 3.5X18.  Post intervention, there is a 0% residual stenosis.  Left ventricular function was mildly diminished at around 45 to 50% with inferior apical hypokinesis  Conclusion Mild reduced left ventricular function with inferior apical hypokinesis ejection fraction of 45 to 50% Cardiac cath with no significant epicardial disease in the left main LAD and circumflex systems 100% occlusion of proximal RCA infarct-related artery STEMI Successful PCI and stent of STEMI RCA reducing the lesion from 100 done to 0 restoring TIMI-3 flow from 0 deployment of DES 3.5 x 18 mm resolute Onyx to 13 atm Minx was deployed to  the right femoral artery   DG Chest Portable 1 View  Result Date: 10/06/2019 CLINICAL DATA:  Chest pain EXAM: PORTABLE CHEST 1 VIEW COMPARISON:  07/17/2016 FINDINGS: Cardiac shadow is again enlarged. Pacing device is again seen and stable. The lungs are well aerated bilaterally. No focal infiltrate or sizable effusion is seen. No bony abnormality is seen. IMPRESSION: No active disease. Electronically Signed   By: Inez Catalina M.D.   On: 10/06/2019 22:46      Labs:   Basic Metabolic Panel: Recent Labs  Lab 10/06/19 2153 10/07/19 0250  NA 137 136  K 3.7 4.5  CL 104 104  CO2 23 26  GLUCOSE 145* 224*  BUN 32* 31*  CREATININE 1.62* 1.36*  CALCIUM 9.2 8.5*   GFR Estimated Creatinine Clearance: 52.5 mL/min (A) (by C-G formula based on SCr of 1.36 mg/dL (H)). Liver Function Tests: No results for input(s): AST, ALT, ALKPHOS, BILITOT, PROT, ALBUMIN in the last 168 hours. No results for input(s): LIPASE, AMYLASE in the last 168 hours. No results for input(s): AMMONIA in the last 168 hours. Coagulation profile Recent Labs  Lab 10/06/19 2153  INR 1.4*    CBC: Recent Labs  Lab 10/06/19 2153 10/07/19 0250  WBC 10.4 9.5  HGB 13.8 12.7*  HCT 42.9 40.2  MCV 92.9 94.1  PLT 225 166   Cardiac Enzymes: No results for input(s): CKTOTAL, CKMB, CKMBINDEX, TROPONINI in the last 168 hours. BNP: Invalid input(s): POCBNP CBG: Recent Labs  Lab 10/07/19 0016  GLUCAP 131*   D-Dimer No results for input(s): DDIMER in the last 72 hours. Hgb A1c No results for input(s): HGBA1C in the last 72 hours. Lipid Profile No results for input(s): CHOL, HDL, LDLCALC, TRIG, CHOLHDL, LDLDIRECT in the last 72 hours. Thyroid function studies No results for input(s): TSH, T4TOTAL, T3FREE, THYROIDAB in the last 72 hours.  Invalid input(s): FREET3 Anemia work up No results for input(s): VITAMINB12, FOLATE, FERRITIN, TIBC, IRON, RETICCTPCT in the last 72 hours. Microbiology Recent Results (from the past 240 hour(s))  Microscopic Examination     Status: Abnormal   Collection Time: 10/04/19 10:45 AM   URINE  Result Value Ref Range Status   WBC, UA 11-30 (A) 0 - 5 /hpf Final   RBC >30 (A) 0 - 2 /hpf Final   Epithelial Cells (non renal) 0-10 0 - 10 /hpf Final   Casts Present (A) None seen /lpf Final   Cast Type Hyaline casts N/A Final   Bacteria, UA Few None seen/Few Final  CULTURE, URINE COMPREHENSIVE     Status: None   Collection Time:  10/04/19 11:05 AM   Specimen: Urine   UR  Result Value Ref Range Status   Urine Culture, Comprehensive Final report  Final   Organism ID, Bacteria Comment  Final    Comment: No growth in 36 - 48 hours.  Respiratory Panel by RT PCR (Flu A&B, Covid) - Nasopharyngeal Swab     Status: None   Collection Time: 10/06/19  9:53 PM   Specimen: Nasopharyngeal Swab  Result Value Ref Range Status   SARS Coronavirus 2 by RT PCR NEGATIVE NEGATIVE Final    Comment: (NOTE) SARS-CoV-2 target nucleic acids are NOT DETECTED. The SARS-CoV-2 RNA is generally detectable in upper respiratoy specimens during the acute phase of infection. The lowest concentration of SARS-CoV-2 viral copies this assay can detect is 131 copies/mL. A negative result does not preclude SARS-Cov-2 infection and should not be used as the sole basis for treatment  or other patient management decisions. A negative result may occur with  improper specimen collection/handling, submission of specimen other than nasopharyngeal swab, presence of viral mutation(s) within the areas targeted by this assay, and inadequate number of viral copies (<131 copies/mL). A negative result must be combined with clinical observations, patient history, and epidemiological information. The expected result is Negative. Fact Sheet for Patients:  PinkCheek.be Fact Sheet for Healthcare Providers:  GravelBags.it This test is not yet ap proved or cleared by the [place] FDA and  has been authorized for detection and/or diagnosis of SARS-CoV-2 by FDA under an Emergency Use Authorization (EUA). This EUA will remain  in effect (meaning this test can be used) for the duration of the COVID-19 declaration under Section 564(b)(1) of the Act, 21 U.S.C. section 360bbb-3(b)(1), unless the authorization is terminated or revoked sooner.    Influenza A by PCR NEGATIVE NEGATIVE Final   Influenza B by PCR  NEGATIVE NEGATIVE Final    Comment: (NOTE) The Xpert Xpress SARS-CoV-2/FLU/RSV assay is intended as an aid in  the diagnosis of influenza from Nasopharyngeal swab specimens and  should not be used as a sole basis for treatment. Nasal washings and  aspirates are unacceptable for Xpert Xpress SARS-CoV-2/FLU/RSV  testing. Fact Sheet for Patients: PinkCheek.be Fact Sheet for Healthcare Providers: GravelBags.it This test is not yet approved or cleared by the [place] FDA and  has been authorized for detection and/or diagnosis of SARS-CoV-2 by  FDA under an Emergency Use Authorization (EUA). This EUA will remain  in effect (meaning this test can be used) for the duration of the  Covid-19 declaration under Section 564(b)(1) of the Act, 21  U.S.C. section 360bbb-3(b)(1), unless the authorization is  terminated or revoked. Performed at [facility], [address] [place], [place] [address]   MRSA PCR Screening     Status: Abnormal   Collection Time: 10/07/19  1:22 AM   Specimen: Nasopharyngeal  Result Value Ref Range Status   MRSA by PCR POSITIVE (A) NEGATIVE Final    Comment:        The GeneXpert MRSA Assay (FDA approved for NASAL specimens only), is one component of a comprehensive MRSA colonization surveillance program. It is not intended to diagnose MRSA infection nor to guide or monitor treatment for MRSA infections. RESULT CALLED TO, READ BACK BY AND VERIFIED WITHAron Baba RN [phone] 10/07/19 HNM Performed at [facility], [address], [place], [place] [address]      Discharge Instructions:   Discharge Instructions    AMB Referral to Cardiac Rehabilitation - Phase II   Complete by: As directed    Diagnosis: STEMI   After initial evaluation and assessments completed: Virtual Based Care may be provided alone or in conjunction with Phase 2 Cardiac Rehab based on patient barriers.:  Yes   Diet - low sodium heart healthy   Complete by: As directed    Increase activity slowly   Complete by: As directed      Allergies as of 10/09/2019      Reactions   Mold Extract [trichophyton] Swelling, Other (See Comments)   Tightness in throat   Dog Epithelium Swelling, Other (See Comments)   Also, cats too He is near the animals and does okay but allergy testing indicates that he   Is allergic to animals   Other Swelling   Allergic to a variety of trees that surround his home. Allergy testing indicated that  this is an allergy. Took allergy  shots for many years. Stopped 1 yr ago   Tree Extract Swelling   Allergic to a variety of trees that surround his home. Allergy testing indicated that  this is an allergy.  Took allergy shots for many years. Stopped 1 yr ago   Codeine Nausea And Vomiting   Sulfa Antibiotics Nausea And Vomiting      Medication List    STOP taking these medications   amLODipine 5 MG tablet Commonly known as: NORVASC   ciprofloxacin 250 MG tablet Commonly known as: CIPRO   flecainide 100 MG tablet Commonly known as: TAMBOCOR     TAKE these medications   acetaminophen 650 MG CR tablet Commonly known as: TYLENOL Take 1,300 mg by mouth every 8 (eight) hours as needed for pain.   amiodarone 200 MG tablet Commonly known as: PACERONE Take 1 tablet (200 mg total) by mouth daily. Start taking on: October 10, 2019   aspirin EC 81 MG tablet Take 81 mg by mouth daily.   atorvastatin 80 MG tablet Commonly known as: LIPITOR Take 1 tablet (80 mg total) by mouth daily at 6 PM.   clopidogrel 75 MG tablet Commonly known as: PLAVIX Take 1 tablet (75 mg total) by mouth daily. Start taking on: October 10, 2019   cyanocobalamin 1000 MCG tablet Take 1,000 mcg by mouth daily.   Eliquis 5 MG Tabs tablet Generic drug: apixaban TAKE 1 TABLET BY MOUTH EVERY 12 HOURS   FISH OIL PO Take 1 capsule by mouth 2 (two) times daily.   HYDROcodone-acetaminophen 5-325  MG tablet Commonly known as: NORCO/VICODIN Take 1 tablet by mouth every 6 (six) hours as needed for moderate pain.   losartan 100 MG tablet Commonly known as: COZAAR Take 100 mg by mouth at bedtime.   memantine 10 MG tablet Commonly known as: NAMENDA Take by mouth 2 (two) times daily.   metoprolol tartrate 50 MG tablet Commonly known as: LOPRESSOR Take 1 tablet (50 mg total) by mouth 2 (two) times daily. What changed:   medication strength  how much to take   mirtazapine 15 MG tablet Commonly known as: REMERON Take 15 mg by mouth at bedtime.   omeprazole 20 MG capsule Commonly known as: PRILOSEC 2 (two) times daily before a meal.   sildenafil 20 MG tablet Commonly known as: REVATIO Take 20 mg by mouth.      Follow-up Information    Tracie Harrier, MD Follow up.   Specialty: Internal Medicine Why: Please schedule follow up with PCP for within 5-7 days of discharge.  Contact information: [place] [address] [phone]            Time coordinating discharge: 28 minutes  Signed:  Jennye Boroughs  [facility] 10/09/2019, 11:25 AM

## 2019-10-12 ENCOUNTER — Telehealth: Payer: Self-pay

## 2019-10-12 NOTE — Telephone Encounter (Signed)
Patient called today to let us know that he had a hear attack last week. We had started him on Cipro and sent his UA for culture he states that the Cipro caused him to have hallucinations. This has been added to his allergy list in his chart. Patient was instructed to stop taking medication. Patient states he is still having blood in his urine and some burning and spasms noted. Patient was notified that his urine culture was negative and that his symptoms are most likely due to post op healing. Patient was instructed to keep post op follow up and if his symptoms worsen to thick clotted blood, fever, nausea or vomiting to call back

## 2019-10-25 ENCOUNTER — Telehealth: Payer: Self-pay

## 2019-10-25 NOTE — Telephone Encounter (Signed)
Patient called having burning and blood clots in urine since Friday afternoon. Patient is unable to come in this afternoon.

## 2019-10-25 NOTE — Telephone Encounter (Signed)
Spoke with patient he denies fever, chills, nausea or vomiting. Has been passing clots for a few days states his urine is dark red. He believes he is emptying his bladder but does have dysuria. Unable to come in today due to transportation was added on to PA schedule tomorrow. With patient being on additional blood thinners possible clot retention is a concern. Patient was instructed to call back to the office if he felt he was unable to urinate or empty . Patient verbalized understanding

## 2019-10-26 ENCOUNTER — Other Ambulatory Visit: Payer: Self-pay

## 2019-10-26 ENCOUNTER — Ambulatory Visit (INDEPENDENT_AMBULATORY_CARE_PROVIDER_SITE_OTHER): Payer: Medicare PPO | Admitting: Physician Assistant

## 2019-10-26 VITALS — BP 136/83 | HR 87 | Ht 71.0 in | Wt 231.0 lb

## 2019-10-26 DIAGNOSIS — C679 Malignant neoplasm of bladder, unspecified: Secondary | ICD-10-CM | POA: Diagnosis not present

## 2019-10-26 DIAGNOSIS — R310 Gross hematuria: Secondary | ICD-10-CM | POA: Diagnosis not present

## 2019-10-26 LAB — URINALYSIS, COMPLETE
Bilirubin, UA: NEGATIVE
Ketones, UA: NEGATIVE
Nitrite, UA: NEGATIVE
Specific Gravity, UA: 1.015 (ref 1.005–1.030)
Urobilinogen, Ur: 0.2 mg/dL (ref 0.2–1.0)
pH, UA: 5 (ref 5.0–7.5)

## 2019-10-26 LAB — MICROSCOPIC EXAMINATION
RBC, Urine: >30 /hpf — ABNORMAL HIGH (ref 0–2)
WBC, UA: >30 /hpf — ABNORMAL HIGH (ref 0–5)

## 2019-10-26 LAB — BLADDER SCAN AMB NON-IMAGING: Scan Result: 0

## 2019-10-26 NOTE — Progress Notes (Signed)
10/26/2019 3:06 PM   Glyndon Raile Mccullar 30-Mar-1936 [id number]  CC: Johney [place] hematuria  HPI: Joseph Houston is a 84 y.o. male with recent diagnosis of high-grade muscle invasive bladder cancer s/p TURBT with Dr. Bernardo Heater on 09/28/2019 who presents today for evaluation of gross hematuria.  Recent medical history also notable for STEMI s/p RCA stent on aspirin, Plavix, and Eliquis.  He contacted our office yesterday via telephone to report dysuria, gross hematuria, and passage of blood clots per urine x4 days.  Today, he reports passing an approximate 6 cm clot per his urethra yesterday with subsequent clearance of his urine.  He has been pushing fluids in the interim.  He has had no gross hematuria since.  He does report continued dysuria since surgery.  In-office UA today positive for 3+ blood, 1+ protein, and 1+ leukocyte esterase; urine microscopy with >30 WBCs/HPF and>30 RBCs/HPF. PVR 32mL.  PMH: Past Medical History:  Diagnosis Date  . Anemia    was treated in the past, not now  . Anxiety   . Arthritis    knees  . BPH (benign prostatic hypertrophy)   . Chronic kidney disease    stage 3 ckd  . COPD (chronic obstructive pulmonary disease) ([place])    has had most of his life. has not used an inhaler for years  . Coronary artery disease   . Difficult intubation    pt reports bad sore throat after surgery. pt unsure of this response  . Dyspnea   . Dysrhythmia 2016   atrial fibrillation, bradycardia  . GERD (gastroesophageal reflux disease)   . Headache   . History of hiatal hernia   . History of kidney stones 04/2018  . Hypertension   . Pneumonia   . Pre-diabetes    dr. Ginette Pitman following  . Presence of permanent cardiac pacemaker 2017  . Psoriasis 2019   elbows  . Restless leg   . STEMI involving right coronary artery ([place]) 10/07/2019  . Vertigo 1992  . Yeast infection    in groin for 50 years    Surgical History: Past Surgical History:  Procedure Laterality Date  .  CATARACT EXTRACTION W/PHACO Right 09/25/2016   Procedure: CATARACT EXTRACTION PHACO AND INTRAOCULAR LENS PLACEMENT (IOC);  Surgeon: Estill Cotta, MD;  Location: [facility];  Service: Ophthalmology;  Laterality: Right;  [place] 01:58 AP% 18.1 CDE 43.36 Fluid pack # [id number]  . CORONARY STENT INTERVENTION N/A 10/06/2019   Procedure: CORONARY STENT INTERVENTION;  Surgeon: Yolonda Kida, MD;  Location: [facility];  Service: Cardiovascular;  Laterality: N/A;  MID RCA  . CORONARY/GRAFT ACUTE MI REVASCULARIZATION N/A 10/06/2019   Procedure: Coronary/Graft Acute MI Revascularization;  Surgeon: Yolonda Kida, MD;  Location: [facility];  Service: Cardiovascular;  Laterality: N/A;  . CYSTOSCOPY N/A 09/28/2019   Procedure: CYSTOSCOPY;  Surgeon: Abbie Sons, MD;  Location: [facility];  Service: Urology;  Laterality: N/A;  . CYSTOSCOPY/URETEROSCOPY/HOLMIUM LASER/STENT PLACEMENT Right 04/13/2018   Procedure: CYSTOSCOPY/URETEROSCOPY/HOLMIUM LASER/STENT PLACEMENT;  Surgeon: Hollice Espy, MD;  Location: [facility];  Service: Urology;  Laterality: Right;  . ELECTROPHYSIOLOGIC STUDY N/A 12/22/2014   Procedure: CARDIOVERSION;  Surgeon: Teodoro Spray, MD;  Location: [facility];  Service: Cardiovascular;  Laterality: N/A;  . FINGER SURGERY Right 1950   index finger cut off half way  . HERNIA REPAIR Bilateral    inguinal repaired x 4  . INSERT / REPLACE / REMOVE PACEMAKER     12/17  . JOINT REPLACEMENT  Right 2012   partial knee replacement  . LEFT HEART CATH AND CORONARY ANGIOGRAPHY N/A 10/06/2019   Procedure: LEFT HEART CATH AND CORONARY ANGIOGRAPHY;  Surgeon: Yolonda Kida, MD;  Location: [facility];  Service: Cardiovascular;  Laterality: N/A;  . PACEMAKER INSERTION Left 07/17/2016   Procedure: INSERTION PACEMAKER;  Surgeon: Isaias Cowman, MD;  Location: [facility];  Service: Cardiovascular;  Laterality: Left;  . PARTIAL KNEE ARTHROPLASTY Right 2012  . TONSILLECTOMY  1950   . TOTAL KNEE ARTHROPLASTY Left 09/30/2017   Procedure: TOTAL KNEE ARTHROPLASTY;  Surgeon: Hessie Knows, MD;  Location: [facility];  Service: Orthopedics;  Laterality: Left;  . TRANSURETHRAL RESECTION OF BLADDER TUMOR N/A 09/28/2019   Procedure: TRANSURETHRAL RESECTION OF BLADDER TUMOR (TURBT);  Surgeon: Abbie Sons, MD;  Location: [facility];  Service: Urology;  Laterality: N/A;  . TRANSURETHRAL RESECTION OF PROSTATE      Home Medications:  Allergies as of 10/26/2019      Reactions   Mold Extract [trichophyton] Swelling, Other (See Comments)   Tightness in throat   Dog Epithelium Swelling, Other (See Comments)   Also, cats too He is near the animals and does okay but allergy testing indicates that he   Is allergic to animals   Other Swelling   Allergic to a variety of trees that surround his home. Allergy testing indicated that  this is an allergy. Took allergy shots for many years. Stopped 1 yr ago   Tree Extract Swelling   Allergic to a variety of trees that surround his home. Allergy testing indicated that  this is an allergy.  Took allergy shots for many years. Stopped 1 yr ago   Ciprofloxacin    Hallucinations    Codeine Nausea And Vomiting   Sulfa Antibiotics Nausea And Vomiting      Medication List       Accurate as of October 26, 2019  3:06 PM. If you have any questions, ask your nurse or doctor.        acetaminophen 650 MG CR tablet Commonly known as: TYLENOL Take 1,300 mg by mouth every 8 (eight) hours as needed for pain.   amiodarone 200 MG tablet Commonly known as: PACERONE Take 1 tablet (200 mg total) by mouth daily.   aspirin EC 81 MG tablet Take 81 mg by mouth daily.   atorvastatin 80 MG tablet Commonly known as: LIPITOR Take 1 tablet (80 mg total) by mouth daily at 6 PM.   clopidogrel 75 MG tablet Commonly known as: PLAVIX Take 1 tablet (75 mg total) by mouth daily.   cyanocobalamin 1000 MCG tablet Take 1,000 mcg by mouth daily.   Eliquis 5 MG  Tabs tablet Generic drug: apixaban TAKE 1 TABLET BY MOUTH EVERY 12 HOURS   FISH OIL PO Take 1 capsule by mouth 2 (two) times daily.   HYDROcodone-acetaminophen 5-325 MG tablet Commonly known as: NORCO/VICODIN Take 1 tablet by mouth every 6 (six) hours as needed for moderate pain.   losartan 100 MG tablet Commonly known as: COZAAR Take 100 mg by mouth at bedtime.   memantine 10 MG tablet Commonly known as: NAMENDA Take by mouth 2 (two) times daily.   metoprolol tartrate 50 MG tablet Commonly known as: LOPRESSOR Take 1 tablet (50 mg total) by mouth 2 (two) times daily.   mirtazapine 15 MG tablet Commonly known as: REMERON Take 15 mg by mouth at bedtime.   omeprazole 20 MG capsule Commonly known as: PRILOSEC 2 (two) times daily  before a meal.   sildenafil 20 MG tablet Commonly known as: REVATIO Take 20 mg by mouth.       Allergies:  Allergies  Allergen Reactions  . Mold Extract [Trichophyton] Swelling and Other (See Comments)    Tightness in throat  . Dog Epithelium Swelling and Other (See Comments)    Also, cats too He is near the animals and does okay but allergy testing indicates that he   Is allergic to animals    . Other Swelling    Allergic to a variety of trees that surround his home. Allergy testing indicated that  this is an allergy. Took allergy shots for many years. Stopped 1 yr ago  . Tree Extract Swelling    Allergic to a variety of trees that surround his home. Allergy testing indicated that  this is an allergy.  Took allergy shots for many years. Stopped 1 yr ago  . Ciprofloxacin     Hallucinations   . Codeine Nausea And Vomiting  . Sulfa Antibiotics Nausea And Vomiting    Family History: No family history on file.  Social History:   reports that he quit smoking about 53 years ago. His smoking use included cigarettes. He smoked 2.00 packs per day. He has never used smokeless tobacco. He reports that he does not drink alcohol or use  drugs.  Physical Exam: BP 136/83   Pulse 87   Ht 5\' 11"  (1.803 m)   Wt 231 lb (104.8 kg)   BMI 32.22 kg/m   Constitutional:  Alert and oriented, no acute distress, nontoxic appearing HEENT: [place], AT Cardiovascular: No clubbing, cyanosis, or edema Respiratory: Normal respiratory effort, no increased work of breathing Skin: No rashes, bruises or suspicious lesions Neurologic: Grossly intact, no focal deficits, moving all 4 extremities Psychiatric: Normal mood and affect  Laboratory Data: Results for orders placed or performed in visit on 10/26/19  Microscopic Examination   URINE  Result Value Ref Range   WBC, UA >30 (H) 0 - 5 /hpf   RBC >30 (H) 0 - 2 /hpf   Epithelial Cells (non renal) 0-10 0 - 10 /hpf   Crystals Present (A) N/A   Crystal Type Amorphous Sediment N/A   Bacteria, UA Few (A) None seen/Few  Urinalysis, Complete  Result Value Ref Range   Specific Gravity, UA 1.015 1.005 - 1.030   pH, UA 5.0 5.0 - 7.5   Color, UA Yellow Yellow   Appearance Ur Cloudy (A) Clear   Leukocytes,UA 1+ (A) Negative   Protein,UA 1+ (A) Negative/Trace   Glucose, UA Trace (A) Negative   Ketones, UA Negative Negative   RBC, UA 3+ (A) Negative   Bilirubin, UA Negative Negative   Urobilinogen, Ur 0.2 0.2 - 1.0 mg/dL   Nitrite, UA Negative Negative   Microscopic Examination See below:   Bladder Scan (Post Void Residual) in office  Result Value Ref Range   Scan Result 0    Assessment & Plan:   1. Gross hematuria 84 year old male with muscle invasive bladder cancer and recent STEMI on triple antithrombotic therapy presents today with reports of gross hematuria, significantly improved with pushing fluids and after passing a large clot yesterday.  PVR 0 today.  I am not concerned for clot retention.  UA notable for microscopic hematuria and pyuria, unclear if this represents urinary infection versus expected urinary changes secondary to bladder cancer.  Will send urine for culture for further  evaluation.  I counseled the patient on signs and symptoms  of gross hematuria requiring urgent evaluation, including thick, ketchup-like urinary output; passage of large clots; dark, maroon-colored urine; lower abdominal pain; lumbar pain; abdominal distention; and the inability to urinate.  I advised him to contact the office for assistance if he develops these symptoms during routine office hours, 8 AM to 5 PM Monday through Friday.  If outside those hours, I advised him to proceed to the emergency department. He expressed understanding.  - Urinalysis, Complete - Bladder Scan (Post Void Residual) in office - CULTURE, URINE COMPREHENSIVE  2. 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   Return if symptoms worsen or fail to improve.  Debroah Loop, PA-C  [facility] [address], [place], [place] [address] [phone]

## 2019-10-27 ENCOUNTER — Ambulatory Visit: Payer: Medicare PPO | Admitting: Urology

## 2019-10-28 NOTE — Progress Notes (Signed)
10/29/19 12:59 PM   Joseph Houston Jun 20, 1936 [id number]  Referring provider: Tracie Harrier, MD [address] [facility] [place],  [place] [address]  Chief Complaint  Patient presents with  . Follow-up    HPI: 84 yo M who returns today for a 4 week f/u post op.  -Recent diagnosis of high-grade muscle invasive bladder cancer s/p TURBT on 09/28/19  -UA on 10/26/19 indicated microscopic blood and pyuria  -Associated urine culture negative  -reports of frequency, urgency and nocturia x4 -Multidisciplinary oncology clinic referral [facility] pending -Recently saw Dr. Ubaldo Glassing who recommended staying on Plavix with any urologic procedure  IPSS    Row Name 10/29/19 1100         International Prostate Symptom Score   How often have you had the sensation of not emptying your bladder?  Less than half the time     How often have you had to urinate less than every two hours?  About half the time     How often have you found you stopped and started again several times when you urinated?  About half the time     How often have you found it difficult to postpone urination?  Less than half the time     How often have you had a weak urinary stream?  More than half the time     How often have you had to strain to start urination?  More than half the time     How many times did you typically get up at night to urinate?  4 Times     Total IPSS Score  22       Quality of Life due to urinary symptoms   If you were to spend the rest of your life with your urinary condition just the way it is now how would you feel about that?  Unhappy        Score:  1-7 Mild 8-19 Moderate 20-35 Severe  PMH: Past Medical History:  Diagnosis Date  . Anemia    was treated in the past, not now  . Anxiety   . Arthritis    knees  . BPH (benign prostatic hypertrophy)   . Chronic kidney disease    stage 3 ckd  . COPD (chronic obstructive pulmonary disease) ([place])    has had most of his life.  has not used an inhaler for years  . Coronary artery disease   . Difficult intubation    pt reports bad sore throat after surgery. pt unsure of this response  . Dyspnea   . Dysrhythmia 2016   atrial fibrillation, bradycardia  . GERD (gastroesophageal reflux disease)   . Headache   . History of hiatal hernia   . History of kidney stones 04/2018  . Hypertension   . Pneumonia   . Pre-diabetes    dr. Ginette Pitman following  . Presence of permanent cardiac pacemaker 2017  . Psoriasis 2019   elbows  . Restless leg   . STEMI involving right coronary artery ([place]) 10/07/2019  . Vertigo 1992  . Yeast infection    in groin for 50 years    Surgical History: Past Surgical History:  Procedure Laterality Date  . CATARACT EXTRACTION W/PHACO Right 09/25/2016   Procedure: CATARACT EXTRACTION PHACO AND INTRAOCULAR LENS PLACEMENT (IOC);  Surgeon: Estill Cotta, MD;  Location: [facility];  Service: Ophthalmology;  Laterality: Right;  [place] 01:58 AP% 18.1 CDE 43.36 Fluid pack # [id number]  . CORONARY STENT  INTERVENTION N/A 10/06/2019   Procedure: CORONARY STENT INTERVENTION;  Surgeon: Yolonda Kida, MD;  Location: [facility];  Service: Cardiovascular;  Laterality: N/A;  MID RCA  . CORONARY/GRAFT ACUTE MI REVASCULARIZATION N/A 10/06/2019   Procedure: Coronary/Graft Acute MI Revascularization;  Surgeon: Yolonda Kida, MD;  Location: [facility];  Service: Cardiovascular;  Laterality: N/A;  . CYSTOSCOPY N/A 09/28/2019   Procedure: CYSTOSCOPY;  Surgeon: Abbie Sons, MD;  Location: [facility];  Service: Urology;  Laterality: N/A;  . CYSTOSCOPY/URETEROSCOPY/HOLMIUM LASER/STENT PLACEMENT Right 04/13/2018   Procedure: CYSTOSCOPY/URETEROSCOPY/HOLMIUM LASER/STENT PLACEMENT;  Surgeon: Hollice Espy, MD;  Location: [facility];  Service: Urology;  Laterality: Right;  . ELECTROPHYSIOLOGIC STUDY N/A 12/22/2014   Procedure: CARDIOVERSION;  Surgeon: Teodoro Spray, MD;  Location: [facility];  Service:  Cardiovascular;  Laterality: N/A;  . FINGER SURGERY Right 1950   index finger cut off half way  . HERNIA REPAIR Bilateral    inguinal repaired x 4  . INSERT / REPLACE / REMOVE PACEMAKER     12/17  . JOINT REPLACEMENT Right 2012   partial knee replacement  . LEFT HEART CATH AND CORONARY ANGIOGRAPHY N/A 10/06/2019   Procedure: LEFT HEART CATH AND CORONARY ANGIOGRAPHY;  Surgeon: Yolonda Kida, MD;  Location: [facility];  Service: Cardiovascular;  Laterality: N/A;  . PACEMAKER INSERTION Left 07/17/2016   Procedure: INSERTION PACEMAKER;  Surgeon: Isaias Cowman, MD;  Location: [facility];  Service: Cardiovascular;  Laterality: Left;  . PARTIAL KNEE ARTHROPLASTY Right 2012  . TONSILLECTOMY  1950  . TOTAL KNEE ARTHROPLASTY Left 09/30/2017   Procedure: TOTAL KNEE ARTHROPLASTY;  Surgeon: Hessie Knows, MD;  Location: [facility];  Service: Orthopedics;  Laterality: Left;  . TRANSURETHRAL RESECTION OF BLADDER TUMOR N/A 09/28/2019   Procedure: TRANSURETHRAL RESECTION OF BLADDER TUMOR (TURBT);  Surgeon: Abbie Sons, MD;  Location: [facility];  Service: Urology;  Laterality: N/A;  . TRANSURETHRAL RESECTION OF PROSTATE      Home Medications:  Allergies as of 10/29/2019      Reactions   Mold Extract [trichophyton] Swelling, Other (See Comments)   Tightness in throat   Dog Epithelium Swelling, Other (See Comments)   Also, cats too He is near the animals and does okay but allergy testing indicates that he   Is allergic to animals   Other Swelling   Allergic to a variety of trees that surround his home. Allergy testing indicated that  this is an allergy. Took allergy shots for many years. Stopped 1 yr ago   Tree Extract Swelling   Allergic to a variety of trees that surround his home. Allergy testing indicated that  this is an allergy.  Took allergy shots for many years. Stopped 1 yr ago   Ciprofloxacin    Hallucinations    Codeine Nausea And Vomiting   Sulfa Antibiotics Nausea  And Vomiting      Medication List       Accurate as of October 29, 2019 12:59 PM. If you have any questions, ask your nurse or doctor.        acetaminophen 650 MG CR tablet Commonly known as: TYLENOL Take 1,300 mg by mouth every 8 (eight) hours as needed for pain.   amiodarone 200 MG tablet Commonly known as: PACERONE Take 1 tablet (200 mg total) by mouth daily.   aspirin EC 81 MG tablet Take 81 mg by mouth daily.   atorvastatin 80 MG tablet Commonly known as: LIPITOR Take 1 tablet (  80 mg total) by mouth daily at 6 PM.   clopidogrel 75 MG tablet Commonly known as: PLAVIX Take 1 tablet (75 mg total) by mouth daily.   cyanocobalamin 1000 MCG tablet Take 1,000 mcg by mouth daily.   Eliquis 5 MG Tabs tablet Generic drug: apixaban TAKE 1 TABLET BY MOUTH EVERY 12 HOURS   FISH OIL PO Take 1 capsule by mouth 2 (two) times daily.   HYDROcodone-acetaminophen 5-325 MG tablet Commonly known as: NORCO/VICODIN Take 1 tablet by mouth every 6 (six) hours as needed for moderate pain.   losartan 100 MG tablet Commonly known as: COZAAR Take 100 mg by mouth at bedtime.   memantine 10 MG tablet Commonly known as: NAMENDA Take by mouth 2 (two) times daily.   metoprolol tartrate 50 MG tablet Commonly known as: LOPRESSOR Take 1 tablet (50 mg total) by mouth 2 (two) times daily.   mirtazapine 15 MG tablet Commonly known as: REMERON Take 15 mg by mouth at bedtime.   omeprazole 20 MG capsule Commonly known as: PRILOSEC 2 (two) times daily before a meal.   sildenafil 20 MG tablet Commonly known as: REVATIO Take 20 mg by mouth.       Allergies:  Allergies  Allergen Reactions  . Mold Extract [Trichophyton] Swelling and Other (See Comments)    Tightness in throat  . Dog Epithelium Swelling and Other (See Comments)    Also, cats too He is near the animals and does okay but allergy testing indicates that he   Is allergic to animals    . Other Swelling    Allergic to a  variety of trees that surround his home. Allergy testing indicated that  this is an allergy. Took allergy shots for many years. Stopped 1 yr ago  . Tree Extract Swelling    Allergic to a variety of trees that surround his home. Allergy testing indicated that  this is an allergy.  Took allergy shots for many years. Stopped 1 yr ago  . Ciprofloxacin     Hallucinations   . Codeine Nausea And Vomiting  . Sulfa Antibiotics Nausea And Vomiting    Family History: No family history on file.  Social History:  reports that he quit smoking about 53 years ago. His smoking use included cigarettes. He smoked 2.00 packs per day. He has never used smokeless tobacco. He reports that he does not drink alcohol or use drugs.   Physical Exam: BP (!) 143/81   Pulse 88   Ht 5\' 11"  (1.803 m)   Wt 244 lb (110.7 kg)   BMI 34.03 kg/m   Constitutional:  Alert and oriented, No acute distress. HEENT: [place] AT, moist mucus membranes.  Trachea midline, no masses. Cardiovascular: No clubbing, cyanosis, or edema. Respiratory: Normal respiratory effort, no increased work of breathing. Skin: No rashes, bruises or suspicious lesions. Neurologic: Grossly intact, no focal deficits, moving all 4 extremities. Psychiatric: Normal mood and affect.  Laboratory Data: Lab Results  Component Value Date   WBC 9.5 10/07/2019   HGB 12.7 (L) 10/07/2019   HCT 40.2 10/07/2019   MCV 94.1 10/07/2019   PLT 166 10/07/2019    Lab Results  Component Value Date   CREATININE 1.36 (H) 10/07/2019   Pertinent Imaging: Results for orders placed in visit on 09/06/19  CT HEMATURIA WORKUP   Narrative CLINICAL DATA:  Gross hematuria and right flank pain  EXAM: CT ABDOMEN AND PELVIS WITHOUT AND WITH CONTRAST  TECHNIQUE: Multidetector CT imaging of the abdomen and  pelvis was performed following the standard protocol before and following the bolus administration of intravenous contrast.  CONTRAST:  148mL OMNIPAQUE IOHEXOL 300  MG/ML  SOLN  COMPARISON:  03/05/2019 CT abdomen  FINDINGS: Lower chest: Stable pleural calcifications along the hemidiaphragms. Pacer leads noted. Mild cardiomegaly. Right coronary artery and descending thoracic aortic atherosclerotic calcification.  Hepatobiliary: 5 mm hypodense lesion in segment 4a of the liver on image 11/7 is technically too small to characterize although statistically likely to be benign, and unchanged from the prior exam. Gallbladder unremarkable.  Pancreas: Unremarkable  Spleen: Unremarkable  Adrenals/Urinary Tract: Both adrenal glands appear normal. Bilateral simple appearing renal cysts are present. Some of the hypodense renal lesions are technically too small to characterize, although statistically likely to be cysts.  Marked bladder wall thickening anteriorly and along the right side of the urinary bladder extending from the upper margin of the urinary bladder down towards the inferior margin. This tissue is enhancing and suspicious for a sessile bladder mass given the asymmetry. Stranding in the adjacent space of [place].  No filling defect is identified along the remainder of the urothelium. No urinary tract calculi are identified.  Stomach/Bowel: There is some scattered air-fluid levels in nondilated loops of proximal small bowel. Multifocal low-grade mesenteric edema along small segments of small bowel, for example on image 69/10 and along a separately on image 83/10.  Vascular/Lymphatic: Aortoiliac atherosclerotic vascular disease. Fatty inguinal lymph nodes are once again observed. No solid pathologic adenopathy identified.  Reproductive: Suspected prior TURP. Mild prostatomegaly.  Other: No supplemental non-categorized findings.  Musculoskeletal: Bilateral inguinal hernia repairs in the past. Rim sclerotic lucent lesion along the iliac side of the lower margin of the right SI joint, probably secondary to arthropathy.  Lumbar spondylosis, degenerative disc disease, and congenitally short pedicles causing multilevel impingement in the lumbar spine. Grade 1 degenerative anterolisthesis at L4-5 with loss of disc height and disc desiccation at this level.  IMPRESSION: 1. 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 Cystoscopy recommended. 2. Multifocal low-grade mesenteric edema along segments of small bowel. This is nonspecific but could be due to low-grade enteritis. 3. Other imaging findings of potential clinical significance: Stable pleural calcifications along the hemidiaphragms. Mild cardiomegaly. Coronary atherosclerosis. Bilateral renal cysts. Multilevel lumbar impingement due to spondylosis, degenerative disc disease, and congenitally short pedicles. Rim sclerotic lucent lesion along the iliac side of the right SI joint, probably secondary to arthropathy. Bilateral inguinal hernia repairs in the past.   Electronically Signed   By: Van Clines M.D.   On: 09/06/2019 15:48     I have personally reviewed the images and agree with radiologist interpretation.   Assessment & Plan:    1. Muscle invasive bladder cancer  S/p TURBT on 09/28/19 Awaiting appointment for [facility] multidisciplinary GU oncology appointment to discuss treatment options    Abbie Sons, MD  [facility] [address], [place], [place] [address] [phone]  I, Lucas Mallow, am acting as a scribe for Dr. Nicki Reaper C. Kushi Kun,  I have reviewed the above documentation for accuracy and completeness, and I agree with the above.    Abbie Sons, MD

## 2019-10-29 ENCOUNTER — Other Ambulatory Visit: Payer: Self-pay

## 2019-10-29 ENCOUNTER — Encounter: Payer: Self-pay | Admitting: Urology

## 2019-10-29 ENCOUNTER — Ambulatory Visit (INDEPENDENT_AMBULATORY_CARE_PROVIDER_SITE_OTHER): Payer: Medicare PPO | Admitting: Urology

## 2019-10-29 VITALS — BP 143/81 | HR 88 | Ht 71.0 in | Wt 244.0 lb

## 2019-10-29 DIAGNOSIS — C679 Malignant neoplasm of bladder, unspecified: Secondary | ICD-10-CM | POA: Diagnosis not present

## 2019-10-30 LAB — CULTURE, URINE COMPREHENSIVE

## 2019-11-04 ENCOUNTER — Telehealth: Payer: Self-pay | Admitting: Radiology

## 2019-11-04 NOTE — Telephone Encounter (Signed)
Unable to reach patient by phone & no voicemail set up. Spoke with Lujean Rave regarding referral to [facility]. She is confirming availability for appointment with radiation oncology with a tentative appointment on 11/18/2019. She will contact patient once date is confirmed.

## 2019-11-04 NOTE — Telephone Encounter (Signed)
Notified patient that he will be contacted soon with an appointment at [facility], tentative date is 11/18/2019. Patient expresses understanding.

## 2019-11-05 DIAGNOSIS — C689 Malignant neoplasm of urinary organ, unspecified: Secondary | ICD-10-CM | POA: Diagnosis not present

## 2019-11-15 NOTE — Telephone Encounter (Signed)
Patient called to request the phone number for the [facility]. There were multiple numbers listed however only one was given to him; [phone] incase he calls back.

## 2019-11-18 DIAGNOSIS — C678 Malignant neoplasm of overlapping sites of bladder: Secondary | ICD-10-CM | POA: Diagnosis not present

## 2019-11-18 DIAGNOSIS — C679 Malignant neoplasm of bladder, unspecified: Secondary | ICD-10-CM | POA: Diagnosis not present

## 2019-11-19 DIAGNOSIS — R9341 Abnormal radiologic findings on diagnostic imaging of renal pelvis, ureter, or bladder: Secondary | ICD-10-CM | POA: Diagnosis not present

## 2019-11-19 DIAGNOSIS — J90 Pleural effusion, not elsewhere classified: Secondary | ICD-10-CM | POA: Diagnosis not present

## 2019-11-19 DIAGNOSIS — C679 Malignant neoplasm of bladder, unspecified: Secondary | ICD-10-CM | POA: Diagnosis not present

## 2019-11-26 DIAGNOSIS — I495 Sick sinus syndrome: Secondary | ICD-10-CM | POA: Diagnosis not present

## 2019-11-29 DIAGNOSIS — H26491 Other secondary cataract, right eye: Secondary | ICD-10-CM | POA: Diagnosis not present

## 2019-11-29 DIAGNOSIS — H35371 Puckering of macula, right eye: Secondary | ICD-10-CM | POA: Diagnosis not present

## 2019-12-06 ENCOUNTER — Ambulatory Visit: Payer: Medicare HMO | Attending: Internal Medicine

## 2019-12-06 DIAGNOSIS — Z20822 Contact with and (suspected) exposure to covid-19: Secondary | ICD-10-CM

## 2019-12-07 LAB — SARS-COV-2, NAA 2 DAY TAT

## 2019-12-07 LAB — NOVEL CORONAVIRUS, NAA: SARS-CoV-2, NAA: NOT DETECTED

## 2019-12-09 DIAGNOSIS — R350 Frequency of micturition: Secondary | ICD-10-CM | POA: Diagnosis not present

## 2019-12-09 DIAGNOSIS — Z5111 Encounter for antineoplastic chemotherapy: Secondary | ICD-10-CM | POA: Diagnosis not present

## 2019-12-09 DIAGNOSIS — K219 Gastro-esophageal reflux disease without esophagitis: Secondary | ICD-10-CM | POA: Diagnosis not present

## 2019-12-09 DIAGNOSIS — C678 Malignant neoplasm of overlapping sites of bladder: Secondary | ICD-10-CM | POA: Diagnosis not present

## 2019-12-09 DIAGNOSIS — I2510 Atherosclerotic heart disease of native coronary artery without angina pectoris: Secondary | ICD-10-CM | POA: Diagnosis not present

## 2019-12-09 DIAGNOSIS — C671 Malignant neoplasm of dome of bladder: Secondary | ICD-10-CM | POA: Diagnosis not present

## 2019-12-09 DIAGNOSIS — I4891 Unspecified atrial fibrillation: Secondary | ICD-10-CM | POA: Diagnosis not present

## 2019-12-09 DIAGNOSIS — Z510 Encounter for antineoplastic radiation therapy: Secondary | ICD-10-CM | POA: Diagnosis not present

## 2019-12-09 DIAGNOSIS — I252 Old myocardial infarction: Secondary | ICD-10-CM | POA: Diagnosis not present

## 2019-12-09 DIAGNOSIS — I10 Essential (primary) hypertension: Secondary | ICD-10-CM | POA: Diagnosis not present

## 2019-12-16 DIAGNOSIS — K219 Gastro-esophageal reflux disease without esophagitis: Secondary | ICD-10-CM | POA: Diagnosis not present

## 2019-12-16 DIAGNOSIS — I10 Essential (primary) hypertension: Secondary | ICD-10-CM | POA: Diagnosis not present

## 2019-12-16 DIAGNOSIS — Z510 Encounter for antineoplastic radiation therapy: Secondary | ICD-10-CM | POA: Diagnosis not present

## 2019-12-16 DIAGNOSIS — Z5111 Encounter for antineoplastic chemotherapy: Secondary | ICD-10-CM | POA: Diagnosis not present

## 2019-12-16 DIAGNOSIS — I4891 Unspecified atrial fibrillation: Secondary | ICD-10-CM | POA: Diagnosis not present

## 2019-12-16 DIAGNOSIS — R350 Frequency of micturition: Secondary | ICD-10-CM | POA: Diagnosis not present

## 2019-12-16 DIAGNOSIS — I2510 Atherosclerotic heart disease of native coronary artery without angina pectoris: Secondary | ICD-10-CM | POA: Diagnosis not present

## 2019-12-16 DIAGNOSIS — I252 Old myocardial infarction: Secondary | ICD-10-CM | POA: Diagnosis not present

## 2019-12-16 DIAGNOSIS — C671 Malignant neoplasm of dome of bladder: Secondary | ICD-10-CM | POA: Diagnosis not present

## 2019-12-16 DIAGNOSIS — C678 Malignant neoplasm of overlapping sites of bladder: Secondary | ICD-10-CM | POA: Diagnosis not present

## 2019-12-19 DIAGNOSIS — S00411A Abrasion of right ear, initial encounter: Secondary | ICD-10-CM | POA: Diagnosis not present

## 2019-12-20 DIAGNOSIS — Z510 Encounter for antineoplastic radiation therapy: Secondary | ICD-10-CM | POA: Diagnosis not present

## 2019-12-20 DIAGNOSIS — C678 Malignant neoplasm of overlapping sites of bladder: Secondary | ICD-10-CM | POA: Diagnosis not present

## 2019-12-20 DIAGNOSIS — I252 Old myocardial infarction: Secondary | ICD-10-CM | POA: Diagnosis not present

## 2019-12-20 DIAGNOSIS — Z7902 Long term (current) use of antithrombotics/antiplatelets: Secondary | ICD-10-CM | POA: Diagnosis not present

## 2019-12-20 DIAGNOSIS — Z87891 Personal history of nicotine dependence: Secondary | ICD-10-CM | POA: Diagnosis not present

## 2019-12-20 DIAGNOSIS — R350 Frequency of micturition: Secondary | ICD-10-CM | POA: Diagnosis not present

## 2019-12-20 DIAGNOSIS — K219 Gastro-esophageal reflux disease without esophagitis: Secondary | ICD-10-CM | POA: Diagnosis not present

## 2019-12-20 DIAGNOSIS — I10 Essential (primary) hypertension: Secondary | ICD-10-CM | POA: Diagnosis not present

## 2019-12-20 DIAGNOSIS — Z5111 Encounter for antineoplastic chemotherapy: Secondary | ICD-10-CM | POA: Diagnosis not present

## 2019-12-20 DIAGNOSIS — Z882 Allergy status to sulfonamides status: Secondary | ICD-10-CM | POA: Diagnosis not present

## 2019-12-20 DIAGNOSIS — Z7982 Long term (current) use of aspirin: Secondary | ICD-10-CM | POA: Diagnosis not present

## 2019-12-20 DIAGNOSIS — I2510 Atherosclerotic heart disease of native coronary artery without angina pectoris: Secondary | ICD-10-CM | POA: Diagnosis not present

## 2019-12-20 DIAGNOSIS — I4891 Unspecified atrial fibrillation: Secondary | ICD-10-CM | POA: Diagnosis not present

## 2019-12-21 DIAGNOSIS — K219 Gastro-esophageal reflux disease without esophagitis: Secondary | ICD-10-CM | POA: Diagnosis not present

## 2019-12-21 DIAGNOSIS — Z510 Encounter for antineoplastic radiation therapy: Secondary | ICD-10-CM | POA: Diagnosis not present

## 2019-12-21 DIAGNOSIS — I4891 Unspecified atrial fibrillation: Secondary | ICD-10-CM | POA: Diagnosis not present

## 2019-12-21 DIAGNOSIS — I2510 Atherosclerotic heart disease of native coronary artery without angina pectoris: Secondary | ICD-10-CM | POA: Diagnosis not present

## 2019-12-21 DIAGNOSIS — I10 Essential (primary) hypertension: Secondary | ICD-10-CM | POA: Diagnosis not present

## 2019-12-21 DIAGNOSIS — C678 Malignant neoplasm of overlapping sites of bladder: Secondary | ICD-10-CM | POA: Diagnosis not present

## 2019-12-21 DIAGNOSIS — Z5111 Encounter for antineoplastic chemotherapy: Secondary | ICD-10-CM | POA: Diagnosis not present

## 2019-12-21 DIAGNOSIS — I252 Old myocardial infarction: Secondary | ICD-10-CM | POA: Diagnosis not present

## 2019-12-21 DIAGNOSIS — R350 Frequency of micturition: Secondary | ICD-10-CM | POA: Diagnosis not present

## 2019-12-22 DIAGNOSIS — I252 Old myocardial infarction: Secondary | ICD-10-CM | POA: Diagnosis not present

## 2019-12-22 DIAGNOSIS — Z510 Encounter for antineoplastic radiation therapy: Secondary | ICD-10-CM | POA: Diagnosis not present

## 2019-12-22 DIAGNOSIS — I2510 Atherosclerotic heart disease of native coronary artery without angina pectoris: Secondary | ICD-10-CM | POA: Diagnosis not present

## 2019-12-22 DIAGNOSIS — I10 Essential (primary) hypertension: Secondary | ICD-10-CM | POA: Diagnosis not present

## 2019-12-22 DIAGNOSIS — R350 Frequency of micturition: Secondary | ICD-10-CM | POA: Diagnosis not present

## 2019-12-22 DIAGNOSIS — Z5111 Encounter for antineoplastic chemotherapy: Secondary | ICD-10-CM | POA: Diagnosis not present

## 2019-12-22 DIAGNOSIS — K219 Gastro-esophageal reflux disease without esophagitis: Secondary | ICD-10-CM | POA: Diagnosis not present

## 2019-12-22 DIAGNOSIS — C678 Malignant neoplasm of overlapping sites of bladder: Secondary | ICD-10-CM | POA: Diagnosis not present

## 2019-12-22 DIAGNOSIS — I4891 Unspecified atrial fibrillation: Secondary | ICD-10-CM | POA: Diagnosis not present

## 2019-12-23 DIAGNOSIS — K219 Gastro-esophageal reflux disease without esophagitis: Secondary | ICD-10-CM | POA: Diagnosis not present

## 2019-12-23 DIAGNOSIS — R350 Frequency of micturition: Secondary | ICD-10-CM | POA: Diagnosis not present

## 2019-12-23 DIAGNOSIS — I2510 Atherosclerotic heart disease of native coronary artery without angina pectoris: Secondary | ICD-10-CM | POA: Diagnosis not present

## 2019-12-23 DIAGNOSIS — I10 Essential (primary) hypertension: Secondary | ICD-10-CM | POA: Diagnosis not present

## 2019-12-23 DIAGNOSIS — I4891 Unspecified atrial fibrillation: Secondary | ICD-10-CM | POA: Diagnosis not present

## 2019-12-23 DIAGNOSIS — Z510 Encounter for antineoplastic radiation therapy: Secondary | ICD-10-CM | POA: Diagnosis not present

## 2019-12-23 DIAGNOSIS — C678 Malignant neoplasm of overlapping sites of bladder: Secondary | ICD-10-CM | POA: Diagnosis not present

## 2019-12-23 DIAGNOSIS — Z5111 Encounter for antineoplastic chemotherapy: Secondary | ICD-10-CM | POA: Diagnosis not present

## 2019-12-23 DIAGNOSIS — I252 Old myocardial infarction: Secondary | ICD-10-CM | POA: Diagnosis not present

## 2019-12-24 DIAGNOSIS — Z5111 Encounter for antineoplastic chemotherapy: Secondary | ICD-10-CM | POA: Diagnosis not present

## 2019-12-24 DIAGNOSIS — C678 Malignant neoplasm of overlapping sites of bladder: Secondary | ICD-10-CM | POA: Diagnosis not present

## 2019-12-24 DIAGNOSIS — I4891 Unspecified atrial fibrillation: Secondary | ICD-10-CM | POA: Diagnosis not present

## 2019-12-24 DIAGNOSIS — I252 Old myocardial infarction: Secondary | ICD-10-CM | POA: Diagnosis not present

## 2019-12-24 DIAGNOSIS — Z510 Encounter for antineoplastic radiation therapy: Secondary | ICD-10-CM | POA: Diagnosis not present

## 2019-12-24 DIAGNOSIS — K219 Gastro-esophageal reflux disease without esophagitis: Secondary | ICD-10-CM | POA: Diagnosis not present

## 2019-12-24 DIAGNOSIS — I10 Essential (primary) hypertension: Secondary | ICD-10-CM | POA: Diagnosis not present

## 2019-12-24 DIAGNOSIS — I2510 Atherosclerotic heart disease of native coronary artery without angina pectoris: Secondary | ICD-10-CM | POA: Diagnosis not present

## 2019-12-24 DIAGNOSIS — R350 Frequency of micturition: Secondary | ICD-10-CM | POA: Diagnosis not present

## 2019-12-27 DIAGNOSIS — Z510 Encounter for antineoplastic radiation therapy: Secondary | ICD-10-CM | POA: Diagnosis not present

## 2019-12-27 DIAGNOSIS — K219 Gastro-esophageal reflux disease without esophagitis: Secondary | ICD-10-CM | POA: Diagnosis not present

## 2019-12-27 DIAGNOSIS — I10 Essential (primary) hypertension: Secondary | ICD-10-CM | POA: Diagnosis not present

## 2019-12-27 DIAGNOSIS — C671 Malignant neoplasm of dome of bladder: Secondary | ICD-10-CM | POA: Diagnosis not present

## 2019-12-27 DIAGNOSIS — I4891 Unspecified atrial fibrillation: Secondary | ICD-10-CM | POA: Diagnosis not present

## 2019-12-27 DIAGNOSIS — I2510 Atherosclerotic heart disease of native coronary artery without angina pectoris: Secondary | ICD-10-CM | POA: Diagnosis not present

## 2019-12-27 DIAGNOSIS — C678 Malignant neoplasm of overlapping sites of bladder: Secondary | ICD-10-CM | POA: Diagnosis not present

## 2019-12-27 DIAGNOSIS — I252 Old myocardial infarction: Secondary | ICD-10-CM | POA: Diagnosis not present

## 2019-12-27 DIAGNOSIS — R350 Frequency of micturition: Secondary | ICD-10-CM | POA: Diagnosis not present

## 2019-12-27 DIAGNOSIS — Z5111 Encounter for antineoplastic chemotherapy: Secondary | ICD-10-CM | POA: Diagnosis not present

## 2019-12-28 DIAGNOSIS — R350 Frequency of micturition: Secondary | ICD-10-CM | POA: Diagnosis not present

## 2019-12-28 DIAGNOSIS — I4891 Unspecified atrial fibrillation: Secondary | ICD-10-CM | POA: Diagnosis not present

## 2019-12-28 DIAGNOSIS — C678 Malignant neoplasm of overlapping sites of bladder: Secondary | ICD-10-CM | POA: Diagnosis not present

## 2019-12-28 DIAGNOSIS — I2510 Atherosclerotic heart disease of native coronary artery without angina pectoris: Secondary | ICD-10-CM | POA: Diagnosis not present

## 2019-12-28 DIAGNOSIS — K219 Gastro-esophageal reflux disease without esophagitis: Secondary | ICD-10-CM | POA: Diagnosis not present

## 2019-12-28 DIAGNOSIS — I10 Essential (primary) hypertension: Secondary | ICD-10-CM | POA: Diagnosis not present

## 2019-12-28 DIAGNOSIS — Z510 Encounter for antineoplastic radiation therapy: Secondary | ICD-10-CM | POA: Diagnosis not present

## 2019-12-28 DIAGNOSIS — I252 Old myocardial infarction: Secondary | ICD-10-CM | POA: Diagnosis not present

## 2019-12-28 DIAGNOSIS — Z5111 Encounter for antineoplastic chemotherapy: Secondary | ICD-10-CM | POA: Diagnosis not present

## 2019-12-29 DIAGNOSIS — K219 Gastro-esophageal reflux disease without esophagitis: Secondary | ICD-10-CM | POA: Diagnosis not present

## 2019-12-29 DIAGNOSIS — I10 Essential (primary) hypertension: Secondary | ICD-10-CM | POA: Diagnosis not present

## 2019-12-29 DIAGNOSIS — I2510 Atherosclerotic heart disease of native coronary artery without angina pectoris: Secondary | ICD-10-CM | POA: Diagnosis not present

## 2019-12-29 DIAGNOSIS — R350 Frequency of micturition: Secondary | ICD-10-CM | POA: Diagnosis not present

## 2019-12-29 DIAGNOSIS — I4891 Unspecified atrial fibrillation: Secondary | ICD-10-CM | POA: Diagnosis not present

## 2019-12-29 DIAGNOSIS — Z5111 Encounter for antineoplastic chemotherapy: Secondary | ICD-10-CM | POA: Diagnosis not present

## 2019-12-29 DIAGNOSIS — C678 Malignant neoplasm of overlapping sites of bladder: Secondary | ICD-10-CM | POA: Diagnosis not present

## 2019-12-29 DIAGNOSIS — Z510 Encounter for antineoplastic radiation therapy: Secondary | ICD-10-CM | POA: Diagnosis not present

## 2019-12-29 DIAGNOSIS — I252 Old myocardial infarction: Secondary | ICD-10-CM | POA: Diagnosis not present

## 2019-12-30 DIAGNOSIS — R350 Frequency of micturition: Secondary | ICD-10-CM | POA: Diagnosis not present

## 2019-12-30 DIAGNOSIS — Z510 Encounter for antineoplastic radiation therapy: Secondary | ICD-10-CM | POA: Diagnosis not present

## 2019-12-30 DIAGNOSIS — K219 Gastro-esophageal reflux disease without esophagitis: Secondary | ICD-10-CM | POA: Diagnosis not present

## 2019-12-30 DIAGNOSIS — I252 Old myocardial infarction: Secondary | ICD-10-CM | POA: Diagnosis not present

## 2019-12-30 DIAGNOSIS — I2510 Atherosclerotic heart disease of native coronary artery without angina pectoris: Secondary | ICD-10-CM | POA: Diagnosis not present

## 2019-12-30 DIAGNOSIS — I4891 Unspecified atrial fibrillation: Secondary | ICD-10-CM | POA: Diagnosis not present

## 2019-12-30 DIAGNOSIS — I10 Essential (primary) hypertension: Secondary | ICD-10-CM | POA: Diagnosis not present

## 2019-12-30 DIAGNOSIS — Z5111 Encounter for antineoplastic chemotherapy: Secondary | ICD-10-CM | POA: Diagnosis not present

## 2019-12-30 DIAGNOSIS — C678 Malignant neoplasm of overlapping sites of bladder: Secondary | ICD-10-CM | POA: Diagnosis not present

## 2019-12-31 DIAGNOSIS — K219 Gastro-esophageal reflux disease without esophagitis: Secondary | ICD-10-CM | POA: Diagnosis not present

## 2019-12-31 DIAGNOSIS — I10 Essential (primary) hypertension: Secondary | ICD-10-CM | POA: Diagnosis not present

## 2019-12-31 DIAGNOSIS — R350 Frequency of micturition: Secondary | ICD-10-CM | POA: Diagnosis not present

## 2019-12-31 DIAGNOSIS — C678 Malignant neoplasm of overlapping sites of bladder: Secondary | ICD-10-CM | POA: Diagnosis not present

## 2019-12-31 DIAGNOSIS — Z510 Encounter for antineoplastic radiation therapy: Secondary | ICD-10-CM | POA: Diagnosis not present

## 2019-12-31 DIAGNOSIS — Z5111 Encounter for antineoplastic chemotherapy: Secondary | ICD-10-CM | POA: Diagnosis not present

## 2019-12-31 DIAGNOSIS — I2510 Atherosclerotic heart disease of native coronary artery without angina pectoris: Secondary | ICD-10-CM | POA: Diagnosis not present

## 2019-12-31 DIAGNOSIS — I252 Old myocardial infarction: Secondary | ICD-10-CM | POA: Diagnosis not present

## 2019-12-31 DIAGNOSIS — I4891 Unspecified atrial fibrillation: Secondary | ICD-10-CM | POA: Diagnosis not present

## 2020-01-04 DIAGNOSIS — R350 Frequency of micturition: Secondary | ICD-10-CM | POA: Diagnosis not present

## 2020-01-04 DIAGNOSIS — K219 Gastro-esophageal reflux disease without esophagitis: Secondary | ICD-10-CM | POA: Diagnosis not present

## 2020-01-04 DIAGNOSIS — C671 Malignant neoplasm of dome of bladder: Secondary | ICD-10-CM | POA: Diagnosis not present

## 2020-01-04 DIAGNOSIS — I10 Essential (primary) hypertension: Secondary | ICD-10-CM | POA: Diagnosis not present

## 2020-01-04 DIAGNOSIS — I4891 Unspecified atrial fibrillation: Secondary | ICD-10-CM | POA: Diagnosis not present

## 2020-01-04 DIAGNOSIS — Z5111 Encounter for antineoplastic chemotherapy: Secondary | ICD-10-CM | POA: Diagnosis not present

## 2020-01-04 DIAGNOSIS — C678 Malignant neoplasm of overlapping sites of bladder: Secondary | ICD-10-CM | POA: Diagnosis not present

## 2020-01-04 DIAGNOSIS — I2510 Atherosclerotic heart disease of native coronary artery without angina pectoris: Secondary | ICD-10-CM | POA: Diagnosis not present

## 2020-01-04 DIAGNOSIS — Z510 Encounter for antineoplastic radiation therapy: Secondary | ICD-10-CM | POA: Diagnosis not present

## 2020-01-04 DIAGNOSIS — I252 Old myocardial infarction: Secondary | ICD-10-CM | POA: Diagnosis not present

## 2020-01-05 DIAGNOSIS — C678 Malignant neoplasm of overlapping sites of bladder: Secondary | ICD-10-CM | POA: Diagnosis not present

## 2020-01-05 DIAGNOSIS — Z510 Encounter for antineoplastic radiation therapy: Secondary | ICD-10-CM | POA: Diagnosis not present

## 2020-01-05 DIAGNOSIS — I4891 Unspecified atrial fibrillation: Secondary | ICD-10-CM | POA: Diagnosis not present

## 2020-01-05 DIAGNOSIS — R350 Frequency of micturition: Secondary | ICD-10-CM | POA: Diagnosis not present

## 2020-01-05 DIAGNOSIS — I10 Essential (primary) hypertension: Secondary | ICD-10-CM | POA: Diagnosis not present

## 2020-01-05 DIAGNOSIS — I2510 Atherosclerotic heart disease of native coronary artery without angina pectoris: Secondary | ICD-10-CM | POA: Diagnosis not present

## 2020-01-05 DIAGNOSIS — K219 Gastro-esophageal reflux disease without esophagitis: Secondary | ICD-10-CM | POA: Diagnosis not present

## 2020-01-05 DIAGNOSIS — I252 Old myocardial infarction: Secondary | ICD-10-CM | POA: Diagnosis not present

## 2020-01-05 DIAGNOSIS — Z5111 Encounter for antineoplastic chemotherapy: Secondary | ICD-10-CM | POA: Diagnosis not present

## 2020-01-06 DIAGNOSIS — Z510 Encounter for antineoplastic radiation therapy: Secondary | ICD-10-CM | POA: Diagnosis not present

## 2020-01-06 DIAGNOSIS — I252 Old myocardial infarction: Secondary | ICD-10-CM | POA: Diagnosis not present

## 2020-01-06 DIAGNOSIS — I10 Essential (primary) hypertension: Secondary | ICD-10-CM | POA: Diagnosis not present

## 2020-01-06 DIAGNOSIS — C678 Malignant neoplasm of overlapping sites of bladder: Secondary | ICD-10-CM | POA: Diagnosis not present

## 2020-01-06 DIAGNOSIS — R350 Frequency of micturition: Secondary | ICD-10-CM | POA: Diagnosis not present

## 2020-01-06 DIAGNOSIS — K219 Gastro-esophageal reflux disease without esophagitis: Secondary | ICD-10-CM | POA: Diagnosis not present

## 2020-01-06 DIAGNOSIS — Z5111 Encounter for antineoplastic chemotherapy: Secondary | ICD-10-CM | POA: Diagnosis not present

## 2020-01-06 DIAGNOSIS — I2510 Atherosclerotic heart disease of native coronary artery without angina pectoris: Secondary | ICD-10-CM | POA: Diagnosis not present

## 2020-01-06 DIAGNOSIS — I4891 Unspecified atrial fibrillation: Secondary | ICD-10-CM | POA: Diagnosis not present

## 2020-01-07 DIAGNOSIS — Z5111 Encounter for antineoplastic chemotherapy: Secondary | ICD-10-CM | POA: Diagnosis not present

## 2020-01-07 DIAGNOSIS — K219 Gastro-esophageal reflux disease without esophagitis: Secondary | ICD-10-CM | POA: Diagnosis not present

## 2020-01-07 DIAGNOSIS — R350 Frequency of micturition: Secondary | ICD-10-CM | POA: Diagnosis not present

## 2020-01-07 DIAGNOSIS — I4891 Unspecified atrial fibrillation: Secondary | ICD-10-CM | POA: Diagnosis not present

## 2020-01-07 DIAGNOSIS — I2510 Atherosclerotic heart disease of native coronary artery without angina pectoris: Secondary | ICD-10-CM | POA: Diagnosis not present

## 2020-01-07 DIAGNOSIS — Z510 Encounter for antineoplastic radiation therapy: Secondary | ICD-10-CM | POA: Diagnosis not present

## 2020-01-07 DIAGNOSIS — I10 Essential (primary) hypertension: Secondary | ICD-10-CM | POA: Diagnosis not present

## 2020-01-07 DIAGNOSIS — C678 Malignant neoplasm of overlapping sites of bladder: Secondary | ICD-10-CM | POA: Diagnosis not present

## 2020-01-07 DIAGNOSIS — I252 Old myocardial infarction: Secondary | ICD-10-CM | POA: Diagnosis not present

## 2020-01-10 DIAGNOSIS — I2510 Atherosclerotic heart disease of native coronary artery without angina pectoris: Secondary | ICD-10-CM | POA: Diagnosis not present

## 2020-01-10 DIAGNOSIS — I252 Old myocardial infarction: Secondary | ICD-10-CM | POA: Diagnosis not present

## 2020-01-10 DIAGNOSIS — C678 Malignant neoplasm of overlapping sites of bladder: Secondary | ICD-10-CM | POA: Diagnosis not present

## 2020-01-10 DIAGNOSIS — I10 Essential (primary) hypertension: Secondary | ICD-10-CM | POA: Diagnosis not present

## 2020-01-10 DIAGNOSIS — R350 Frequency of micturition: Secondary | ICD-10-CM | POA: Diagnosis not present

## 2020-01-10 DIAGNOSIS — Z510 Encounter for antineoplastic radiation therapy: Secondary | ICD-10-CM | POA: Diagnosis not present

## 2020-01-10 DIAGNOSIS — I4891 Unspecified atrial fibrillation: Secondary | ICD-10-CM | POA: Diagnosis not present

## 2020-01-10 DIAGNOSIS — K219 Gastro-esophageal reflux disease without esophagitis: Secondary | ICD-10-CM | POA: Diagnosis not present

## 2020-01-10 DIAGNOSIS — Z5111 Encounter for antineoplastic chemotherapy: Secondary | ICD-10-CM | POA: Diagnosis not present

## 2020-01-11 DIAGNOSIS — I2510 Atherosclerotic heart disease of native coronary artery without angina pectoris: Secondary | ICD-10-CM | POA: Diagnosis not present

## 2020-01-11 DIAGNOSIS — K219 Gastro-esophageal reflux disease without esophagitis: Secondary | ICD-10-CM | POA: Diagnosis not present

## 2020-01-11 DIAGNOSIS — K922 Gastrointestinal hemorrhage, unspecified: Secondary | ICD-10-CM | POA: Diagnosis not present

## 2020-01-11 DIAGNOSIS — I10 Essential (primary) hypertension: Secondary | ICD-10-CM | POA: Diagnosis not present

## 2020-01-11 DIAGNOSIS — N179 Acute kidney failure, unspecified: Secondary | ICD-10-CM | POA: Diagnosis not present

## 2020-01-11 DIAGNOSIS — R350 Frequency of micturition: Secondary | ICD-10-CM | POA: Diagnosis not present

## 2020-01-11 DIAGNOSIS — Z5111 Encounter for antineoplastic chemotherapy: Secondary | ICD-10-CM | POA: Diagnosis not present

## 2020-01-11 DIAGNOSIS — I4891 Unspecified atrial fibrillation: Secondary | ICD-10-CM | POA: Diagnosis not present

## 2020-01-11 DIAGNOSIS — C678 Malignant neoplasm of overlapping sites of bladder: Secondary | ICD-10-CM | POA: Diagnosis not present

## 2020-01-11 DIAGNOSIS — Z510 Encounter for antineoplastic radiation therapy: Secondary | ICD-10-CM | POA: Diagnosis not present

## 2020-01-11 DIAGNOSIS — I252 Old myocardial infarction: Secondary | ICD-10-CM | POA: Diagnosis not present

## 2020-01-11 DIAGNOSIS — C671 Malignant neoplasm of dome of bladder: Secondary | ICD-10-CM | POA: Diagnosis not present

## 2020-01-11 DIAGNOSIS — R110 Nausea: Secondary | ICD-10-CM | POA: Diagnosis not present

## 2020-01-13 DIAGNOSIS — E860 Dehydration: Secondary | ICD-10-CM | POA: Diagnosis not present

## 2020-01-14 DIAGNOSIS — D649 Anemia, unspecified: Secondary | ICD-10-CM | POA: Diagnosis not present

## 2020-01-14 DIAGNOSIS — R9341 Abnormal radiologic findings on diagnostic imaging of renal pelvis, ureter, or bladder: Secondary | ICD-10-CM | POA: Diagnosis not present

## 2020-01-14 DIAGNOSIS — D65 Disseminated intravascular coagulation [defibrination syndrome]: Secondary | ICD-10-CM | POA: Diagnosis not present

## 2020-01-14 DIAGNOSIS — I871 Compression of vein: Secondary | ICD-10-CM | POA: Diagnosis not present

## 2020-01-14 DIAGNOSIS — K6289 Other specified diseases of anus and rectum: Secondary | ICD-10-CM | POA: Diagnosis not present

## 2020-01-14 DIAGNOSIS — Z950 Presence of cardiac pacemaker: Secondary | ICD-10-CM | POA: Diagnosis not present

## 2020-01-14 DIAGNOSIS — K625 Hemorrhage of anus and rectum: Secondary | ICD-10-CM | POA: Diagnosis not present

## 2020-01-14 DIAGNOSIS — J9811 Atelectasis: Secondary | ICD-10-CM | POA: Diagnosis not present

## 2020-01-14 DIAGNOSIS — R109 Unspecified abdominal pain: Secondary | ICD-10-CM | POA: Diagnosis not present

## 2020-01-14 DIAGNOSIS — R5383 Other fatigue: Secondary | ICD-10-CM | POA: Diagnosis not present

## 2020-01-14 DIAGNOSIS — R778 Other specified abnormalities of plasma proteins: Secondary | ICD-10-CM | POA: Diagnosis not present

## 2020-01-14 DIAGNOSIS — R300 Dysuria: Secondary | ICD-10-CM | POA: Diagnosis not present

## 2020-01-14 DIAGNOSIS — D61818 Other pancytopenia: Secondary | ICD-10-CM | POA: Diagnosis not present

## 2020-01-14 DIAGNOSIS — R7989 Other specified abnormal findings of blood chemistry: Secondary | ICD-10-CM | POA: Diagnosis not present

## 2020-01-14 DIAGNOSIS — R319 Hematuria, unspecified: Secondary | ICD-10-CM | POA: Diagnosis not present

## 2020-01-14 DIAGNOSIS — Z5111 Encounter for antineoplastic chemotherapy: Secondary | ICD-10-CM | POA: Diagnosis not present

## 2020-01-14 DIAGNOSIS — R197 Diarrhea, unspecified: Secondary | ICD-10-CM | POA: Diagnosis not present

## 2020-01-14 DIAGNOSIS — C679 Malignant neoplasm of bladder, unspecified: Secondary | ICD-10-CM | POA: Diagnosis not present

## 2020-01-14 DIAGNOSIS — R05 Cough: Secondary | ICD-10-CM | POA: Diagnosis not present

## 2020-01-14 DIAGNOSIS — K921 Melena: Secondary | ICD-10-CM | POA: Diagnosis not present

## 2020-01-14 DIAGNOSIS — I959 Hypotension, unspecified: Secondary | ICD-10-CM | POA: Diagnosis not present

## 2020-01-14 DIAGNOSIS — I10 Essential (primary) hypertension: Secondary | ICD-10-CM | POA: Diagnosis not present

## 2020-01-14 DIAGNOSIS — A0472 Enterocolitis due to Clostridium difficile, not specified as recurrent: Secondary | ICD-10-CM | POA: Diagnosis not present

## 2020-01-14 DIAGNOSIS — D61810 Antineoplastic chemotherapy induced pancytopenia: Secondary | ICD-10-CM | POA: Diagnosis not present

## 2020-01-14 DIAGNOSIS — J90 Pleural effusion, not elsewhere classified: Secondary | ICD-10-CM | POA: Diagnosis not present

## 2020-01-14 DIAGNOSIS — K922 Gastrointestinal hemorrhage, unspecified: Secondary | ICD-10-CM | POA: Diagnosis not present

## 2020-01-14 DIAGNOSIS — J811 Chronic pulmonary edema: Secondary | ICD-10-CM | POA: Diagnosis not present

## 2020-01-14 DIAGNOSIS — K5730 Diverticulosis of large intestine without perforation or abscess without bleeding: Secondary | ICD-10-CM | POA: Diagnosis not present

## 2020-01-14 DIAGNOSIS — K520 Gastroenteritis and colitis due to radiation: Secondary | ICD-10-CM | POA: Diagnosis not present

## 2020-01-14 DIAGNOSIS — R918 Other nonspecific abnormal finding of lung field: Secondary | ICD-10-CM | POA: Diagnosis not present

## 2020-01-14 DIAGNOSIS — I2510 Atherosclerotic heart disease of native coronary artery without angina pectoris: Secondary | ICD-10-CM | POA: Diagnosis not present

## 2020-01-14 DIAGNOSIS — K627 Radiation proctitis: Secondary | ICD-10-CM | POA: Diagnosis not present

## 2020-01-14 DIAGNOSIS — J984 Other disorders of lung: Secondary | ICD-10-CM | POA: Diagnosis not present

## 2020-01-14 DIAGNOSIS — K6389 Other specified diseases of intestine: Secondary | ICD-10-CM | POA: Diagnosis not present

## 2020-01-14 DIAGNOSIS — R531 Weakness: Secondary | ICD-10-CM | POA: Diagnosis not present

## 2020-01-14 DIAGNOSIS — Z7901 Long term (current) use of anticoagulants: Secondary | ICD-10-CM | POA: Diagnosis not present

## 2020-01-14 DIAGNOSIS — I482 Chronic atrial fibrillation, unspecified: Secondary | ICD-10-CM | POA: Diagnosis not present

## 2020-01-14 DIAGNOSIS — C671 Malignant neoplasm of dome of bladder: Secondary | ICD-10-CM | POA: Diagnosis not present

## 2020-01-14 DIAGNOSIS — R0902 Hypoxemia: Secondary | ICD-10-CM | POA: Diagnosis not present

## 2020-01-14 DIAGNOSIS — I495 Sick sinus syndrome: Secondary | ICD-10-CM | POA: Diagnosis not present

## 2020-01-14 DIAGNOSIS — R600 Localized edema: Secondary | ICD-10-CM | POA: Diagnosis not present

## 2020-01-14 DIAGNOSIS — I252 Old myocardial infarction: Secondary | ICD-10-CM | POA: Diagnosis not present

## 2020-01-24 DIAGNOSIS — C671 Malignant neoplasm of dome of bladder: Secondary | ICD-10-CM | POA: Diagnosis not present

## 2020-01-31 DIAGNOSIS — R350 Frequency of micturition: Secondary | ICD-10-CM | POA: Diagnosis not present

## 2020-01-31 DIAGNOSIS — Z510 Encounter for antineoplastic radiation therapy: Secondary | ICD-10-CM | POA: Diagnosis not present

## 2020-01-31 DIAGNOSIS — K922 Gastrointestinal hemorrhage, unspecified: Secondary | ICD-10-CM | POA: Diagnosis not present

## 2020-01-31 DIAGNOSIS — C678 Malignant neoplasm of overlapping sites of bladder: Secondary | ICD-10-CM | POA: Diagnosis not present

## 2020-01-31 DIAGNOSIS — C671 Malignant neoplasm of dome of bladder: Secondary | ICD-10-CM | POA: Diagnosis not present

## 2020-01-31 DIAGNOSIS — K219 Gastro-esophageal reflux disease without esophagitis: Secondary | ICD-10-CM | POA: Diagnosis not present

## 2020-01-31 DIAGNOSIS — Z5111 Encounter for antineoplastic chemotherapy: Secondary | ICD-10-CM | POA: Diagnosis not present

## 2020-01-31 DIAGNOSIS — I2510 Atherosclerotic heart disease of native coronary artery without angina pectoris: Secondary | ICD-10-CM | POA: Diagnosis not present

## 2020-01-31 DIAGNOSIS — D65 Disseminated intravascular coagulation [defibrination syndrome]: Secondary | ICD-10-CM | POA: Diagnosis not present

## 2020-01-31 DIAGNOSIS — I252 Old myocardial infarction: Secondary | ICD-10-CM | POA: Diagnosis not present

## 2020-01-31 DIAGNOSIS — I10 Essential (primary) hypertension: Secondary | ICD-10-CM | POA: Diagnosis not present

## 2020-01-31 DIAGNOSIS — I4891 Unspecified atrial fibrillation: Secondary | ICD-10-CM | POA: Diagnosis not present

## 2020-01-31 DIAGNOSIS — R110 Nausea: Secondary | ICD-10-CM | POA: Diagnosis not present

## 2020-02-01 DIAGNOSIS — I4891 Unspecified atrial fibrillation: Secondary | ICD-10-CM | POA: Diagnosis not present

## 2020-02-01 DIAGNOSIS — I2510 Atherosclerotic heart disease of native coronary artery without angina pectoris: Secondary | ICD-10-CM | POA: Diagnosis not present

## 2020-02-01 DIAGNOSIS — I10 Essential (primary) hypertension: Secondary | ICD-10-CM | POA: Diagnosis not present

## 2020-02-01 DIAGNOSIS — I252 Old myocardial infarction: Secondary | ICD-10-CM | POA: Diagnosis not present

## 2020-02-01 DIAGNOSIS — K219 Gastro-esophageal reflux disease without esophagitis: Secondary | ICD-10-CM | POA: Diagnosis not present

## 2020-02-01 DIAGNOSIS — Z510 Encounter for antineoplastic radiation therapy: Secondary | ICD-10-CM | POA: Diagnosis not present

## 2020-02-01 DIAGNOSIS — R350 Frequency of micturition: Secondary | ICD-10-CM | POA: Diagnosis not present

## 2020-02-01 DIAGNOSIS — C678 Malignant neoplasm of overlapping sites of bladder: Secondary | ICD-10-CM | POA: Diagnosis not present

## 2020-02-01 DIAGNOSIS — Z5111 Encounter for antineoplastic chemotherapy: Secondary | ICD-10-CM | POA: Diagnosis not present

## 2020-02-02 DIAGNOSIS — K219 Gastro-esophageal reflux disease without esophagitis: Secondary | ICD-10-CM | POA: Diagnosis not present

## 2020-02-02 DIAGNOSIS — I2510 Atherosclerotic heart disease of native coronary artery without angina pectoris: Secondary | ICD-10-CM | POA: Diagnosis not present

## 2020-02-02 DIAGNOSIS — Z510 Encounter for antineoplastic radiation therapy: Secondary | ICD-10-CM | POA: Diagnosis not present

## 2020-02-02 DIAGNOSIS — I4891 Unspecified atrial fibrillation: Secondary | ICD-10-CM | POA: Diagnosis not present

## 2020-02-02 DIAGNOSIS — I10 Essential (primary) hypertension: Secondary | ICD-10-CM | POA: Diagnosis not present

## 2020-02-02 DIAGNOSIS — C678 Malignant neoplasm of overlapping sites of bladder: Secondary | ICD-10-CM | POA: Diagnosis not present

## 2020-02-02 DIAGNOSIS — I252 Old myocardial infarction: Secondary | ICD-10-CM | POA: Diagnosis not present

## 2020-02-02 DIAGNOSIS — Z5111 Encounter for antineoplastic chemotherapy: Secondary | ICD-10-CM | POA: Diagnosis not present

## 2020-02-02 DIAGNOSIS — R350 Frequency of micturition: Secondary | ICD-10-CM | POA: Diagnosis not present

## 2020-02-03 DIAGNOSIS — D65 Disseminated intravascular coagulation [defibrination syndrome]: Secondary | ICD-10-CM | POA: Diagnosis not present

## 2020-02-03 DIAGNOSIS — C678 Malignant neoplasm of overlapping sites of bladder: Secondary | ICD-10-CM | POA: Diagnosis not present

## 2020-02-03 DIAGNOSIS — Z510 Encounter for antineoplastic radiation therapy: Secondary | ICD-10-CM | POA: Diagnosis not present

## 2020-02-03 DIAGNOSIS — I252 Old myocardial infarction: Secondary | ICD-10-CM | POA: Diagnosis not present

## 2020-02-03 DIAGNOSIS — K219 Gastro-esophageal reflux disease without esophagitis: Secondary | ICD-10-CM | POA: Diagnosis not present

## 2020-02-03 DIAGNOSIS — R7989 Other specified abnormal findings of blood chemistry: Secondary | ICD-10-CM | POA: Diagnosis not present

## 2020-02-03 DIAGNOSIS — R350 Frequency of micturition: Secondary | ICD-10-CM | POA: Diagnosis not present

## 2020-02-03 DIAGNOSIS — R8290 Unspecified abnormal findings in urine: Secondary | ICD-10-CM | POA: Diagnosis not present

## 2020-02-03 DIAGNOSIS — I4891 Unspecified atrial fibrillation: Secondary | ICD-10-CM | POA: Diagnosis not present

## 2020-02-03 DIAGNOSIS — I2510 Atherosclerotic heart disease of native coronary artery without angina pectoris: Secondary | ICD-10-CM | POA: Diagnosis not present

## 2020-02-03 DIAGNOSIS — Z5111 Encounter for antineoplastic chemotherapy: Secondary | ICD-10-CM | POA: Diagnosis not present

## 2020-02-03 DIAGNOSIS — I10 Essential (primary) hypertension: Secondary | ICD-10-CM | POA: Diagnosis not present

## 2020-02-04 DIAGNOSIS — I10 Essential (primary) hypertension: Secondary | ICD-10-CM | POA: Diagnosis not present

## 2020-02-04 DIAGNOSIS — Z5111 Encounter for antineoplastic chemotherapy: Secondary | ICD-10-CM | POA: Diagnosis not present

## 2020-02-04 DIAGNOSIS — K219 Gastro-esophageal reflux disease without esophagitis: Secondary | ICD-10-CM | POA: Diagnosis not present

## 2020-02-04 DIAGNOSIS — I2510 Atherosclerotic heart disease of native coronary artery without angina pectoris: Secondary | ICD-10-CM | POA: Diagnosis not present

## 2020-02-04 DIAGNOSIS — N179 Acute kidney failure, unspecified: Secondary | ICD-10-CM | POA: Diagnosis not present

## 2020-02-04 DIAGNOSIS — R350 Frequency of micturition: Secondary | ICD-10-CM | POA: Diagnosis not present

## 2020-02-04 DIAGNOSIS — Z510 Encounter for antineoplastic radiation therapy: Secondary | ICD-10-CM | POA: Diagnosis not present

## 2020-02-04 DIAGNOSIS — I4891 Unspecified atrial fibrillation: Secondary | ICD-10-CM | POA: Diagnosis not present

## 2020-02-04 DIAGNOSIS — I252 Old myocardial infarction: Secondary | ICD-10-CM | POA: Diagnosis not present

## 2020-02-04 DIAGNOSIS — C678 Malignant neoplasm of overlapping sites of bladder: Secondary | ICD-10-CM | POA: Diagnosis not present

## 2020-02-04 DIAGNOSIS — N281 Cyst of kidney, acquired: Secondary | ICD-10-CM | POA: Diagnosis not present

## 2020-02-08 DIAGNOSIS — I4891 Unspecified atrial fibrillation: Secondary | ICD-10-CM | POA: Diagnosis not present

## 2020-02-08 DIAGNOSIS — K219 Gastro-esophageal reflux disease without esophagitis: Secondary | ICD-10-CM | POA: Diagnosis not present

## 2020-02-08 DIAGNOSIS — C671 Malignant neoplasm of dome of bladder: Secondary | ICD-10-CM | POA: Diagnosis not present

## 2020-02-08 DIAGNOSIS — I10 Essential (primary) hypertension: Secondary | ICD-10-CM | POA: Diagnosis not present

## 2020-02-08 DIAGNOSIS — Z5111 Encounter for antineoplastic chemotherapy: Secondary | ICD-10-CM | POA: Diagnosis not present

## 2020-02-08 DIAGNOSIS — C678 Malignant neoplasm of overlapping sites of bladder: Secondary | ICD-10-CM | POA: Diagnosis not present

## 2020-02-08 DIAGNOSIS — I252 Old myocardial infarction: Secondary | ICD-10-CM | POA: Diagnosis not present

## 2020-02-08 DIAGNOSIS — R350 Frequency of micturition: Secondary | ICD-10-CM | POA: Diagnosis not present

## 2020-02-08 DIAGNOSIS — I2510 Atherosclerotic heart disease of native coronary artery without angina pectoris: Secondary | ICD-10-CM | POA: Diagnosis not present

## 2020-02-08 DIAGNOSIS — Z510 Encounter for antineoplastic radiation therapy: Secondary | ICD-10-CM | POA: Diagnosis not present

## 2020-02-09 DIAGNOSIS — I2510 Atherosclerotic heart disease of native coronary artery without angina pectoris: Secondary | ICD-10-CM | POA: Diagnosis not present

## 2020-02-09 DIAGNOSIS — I4891 Unspecified atrial fibrillation: Secondary | ICD-10-CM | POA: Diagnosis not present

## 2020-02-09 DIAGNOSIS — I252 Old myocardial infarction: Secondary | ICD-10-CM | POA: Diagnosis not present

## 2020-02-09 DIAGNOSIS — R350 Frequency of micturition: Secondary | ICD-10-CM | POA: Diagnosis not present

## 2020-02-09 DIAGNOSIS — K219 Gastro-esophageal reflux disease without esophagitis: Secondary | ICD-10-CM | POA: Diagnosis not present

## 2020-02-09 DIAGNOSIS — Z5111 Encounter for antineoplastic chemotherapy: Secondary | ICD-10-CM | POA: Diagnosis not present

## 2020-02-09 DIAGNOSIS — C678 Malignant neoplasm of overlapping sites of bladder: Secondary | ICD-10-CM | POA: Diagnosis not present

## 2020-02-09 DIAGNOSIS — Z510 Encounter for antineoplastic radiation therapy: Secondary | ICD-10-CM | POA: Diagnosis not present

## 2020-02-09 DIAGNOSIS — I10 Essential (primary) hypertension: Secondary | ICD-10-CM | POA: Diagnosis not present

## 2020-02-10 DIAGNOSIS — I2510 Atherosclerotic heart disease of native coronary artery without angina pectoris: Secondary | ICD-10-CM | POA: Diagnosis not present

## 2020-02-10 DIAGNOSIS — I4891 Unspecified atrial fibrillation: Secondary | ICD-10-CM | POA: Diagnosis not present

## 2020-02-10 DIAGNOSIS — K219 Gastro-esophageal reflux disease without esophagitis: Secondary | ICD-10-CM | POA: Diagnosis not present

## 2020-02-10 DIAGNOSIS — C678 Malignant neoplasm of overlapping sites of bladder: Secondary | ICD-10-CM | POA: Diagnosis not present

## 2020-02-10 DIAGNOSIS — I252 Old myocardial infarction: Secondary | ICD-10-CM | POA: Diagnosis not present

## 2020-02-10 DIAGNOSIS — Z5111 Encounter for antineoplastic chemotherapy: Secondary | ICD-10-CM | POA: Diagnosis not present

## 2020-02-10 DIAGNOSIS — Z510 Encounter for antineoplastic radiation therapy: Secondary | ICD-10-CM | POA: Diagnosis not present

## 2020-02-10 DIAGNOSIS — R350 Frequency of micturition: Secondary | ICD-10-CM | POA: Diagnosis not present

## 2020-02-10 DIAGNOSIS — I10 Essential (primary) hypertension: Secondary | ICD-10-CM | POA: Diagnosis not present

## 2020-02-10 DIAGNOSIS — D65 Disseminated intravascular coagulation [defibrination syndrome]: Secondary | ICD-10-CM | POA: Diagnosis not present

## 2020-02-11 DIAGNOSIS — I252 Old myocardial infarction: Secondary | ICD-10-CM | POA: Diagnosis not present

## 2020-02-11 DIAGNOSIS — Z510 Encounter for antineoplastic radiation therapy: Secondary | ICD-10-CM | POA: Diagnosis not present

## 2020-02-11 DIAGNOSIS — I4891 Unspecified atrial fibrillation: Secondary | ICD-10-CM | POA: Diagnosis not present

## 2020-02-11 DIAGNOSIS — Z5111 Encounter for antineoplastic chemotherapy: Secondary | ICD-10-CM | POA: Diagnosis not present

## 2020-02-11 DIAGNOSIS — I10 Essential (primary) hypertension: Secondary | ICD-10-CM | POA: Diagnosis not present

## 2020-02-11 DIAGNOSIS — K219 Gastro-esophageal reflux disease without esophagitis: Secondary | ICD-10-CM | POA: Diagnosis not present

## 2020-02-11 DIAGNOSIS — C678 Malignant neoplasm of overlapping sites of bladder: Secondary | ICD-10-CM | POA: Diagnosis not present

## 2020-02-11 DIAGNOSIS — I2510 Atherosclerotic heart disease of native coronary artery without angina pectoris: Secondary | ICD-10-CM | POA: Diagnosis not present

## 2020-02-11 DIAGNOSIS — R350 Frequency of micturition: Secondary | ICD-10-CM | POA: Diagnosis not present

## 2020-02-14 DIAGNOSIS — R350 Frequency of micturition: Secondary | ICD-10-CM | POA: Diagnosis not present

## 2020-02-14 DIAGNOSIS — I2510 Atherosclerotic heart disease of native coronary artery without angina pectoris: Secondary | ICD-10-CM | POA: Diagnosis not present

## 2020-02-14 DIAGNOSIS — I10 Essential (primary) hypertension: Secondary | ICD-10-CM | POA: Diagnosis not present

## 2020-02-14 DIAGNOSIS — I4891 Unspecified atrial fibrillation: Secondary | ICD-10-CM | POA: Diagnosis not present

## 2020-02-14 DIAGNOSIS — I252 Old myocardial infarction: Secondary | ICD-10-CM | POA: Diagnosis not present

## 2020-02-14 DIAGNOSIS — Z510 Encounter for antineoplastic radiation therapy: Secondary | ICD-10-CM | POA: Diagnosis not present

## 2020-02-14 DIAGNOSIS — C678 Malignant neoplasm of overlapping sites of bladder: Secondary | ICD-10-CM | POA: Diagnosis not present

## 2020-02-14 DIAGNOSIS — K219 Gastro-esophageal reflux disease without esophagitis: Secondary | ICD-10-CM | POA: Diagnosis not present

## 2020-02-14 DIAGNOSIS — Z5111 Encounter for antineoplastic chemotherapy: Secondary | ICD-10-CM | POA: Diagnosis not present

## 2020-02-15 DIAGNOSIS — D65 Disseminated intravascular coagulation [defibrination syndrome]: Secondary | ICD-10-CM | POA: Diagnosis not present

## 2020-02-15 DIAGNOSIS — I252 Old myocardial infarction: Secondary | ICD-10-CM | POA: Diagnosis not present

## 2020-02-15 DIAGNOSIS — C678 Malignant neoplasm of overlapping sites of bladder: Secondary | ICD-10-CM | POA: Diagnosis not present

## 2020-02-15 DIAGNOSIS — Z510 Encounter for antineoplastic radiation therapy: Secondary | ICD-10-CM | POA: Diagnosis not present

## 2020-02-15 DIAGNOSIS — Z5111 Encounter for antineoplastic chemotherapy: Secondary | ICD-10-CM | POA: Diagnosis not present

## 2020-02-15 DIAGNOSIS — I4891 Unspecified atrial fibrillation: Secondary | ICD-10-CM | POA: Diagnosis not present

## 2020-02-15 DIAGNOSIS — R350 Frequency of micturition: Secondary | ICD-10-CM | POA: Diagnosis not present

## 2020-02-15 DIAGNOSIS — I2510 Atherosclerotic heart disease of native coronary artery without angina pectoris: Secondary | ICD-10-CM | POA: Diagnosis not present

## 2020-02-15 DIAGNOSIS — I10 Essential (primary) hypertension: Secondary | ICD-10-CM | POA: Diagnosis not present

## 2020-02-15 DIAGNOSIS — K219 Gastro-esophageal reflux disease without esophagitis: Secondary | ICD-10-CM | POA: Diagnosis not present

## 2020-02-29 DIAGNOSIS — I10 Essential (primary) hypertension: Secondary | ICD-10-CM | POA: Diagnosis not present

## 2020-02-29 DIAGNOSIS — I455 Other specified heart block: Secondary | ICD-10-CM | POA: Diagnosis not present

## 2020-02-29 DIAGNOSIS — I4891 Unspecified atrial fibrillation: Secondary | ICD-10-CM | POA: Diagnosis not present

## 2020-02-29 DIAGNOSIS — Z7901 Long term (current) use of anticoagulants: Secondary | ICD-10-CM | POA: Diagnosis not present

## 2020-03-01 DIAGNOSIS — K644 Residual hemorrhoidal skin tags: Secondary | ICD-10-CM | POA: Diagnosis not present

## 2020-03-01 DIAGNOSIS — K5900 Constipation, unspecified: Secondary | ICD-10-CM | POA: Diagnosis not present

## 2020-03-07 DIAGNOSIS — R413 Other amnesia: Secondary | ICD-10-CM | POA: Diagnosis not present

## 2020-03-07 DIAGNOSIS — K6289 Other specified diseases of anus and rectum: Secondary | ICD-10-CM | POA: Diagnosis not present

## 2020-03-07 DIAGNOSIS — I480 Paroxysmal atrial fibrillation: Secondary | ICD-10-CM | POA: Diagnosis not present

## 2020-03-07 DIAGNOSIS — I129 Hypertensive chronic kidney disease with stage 1 through stage 4 chronic kidney disease, or unspecified chronic kidney disease: Secondary | ICD-10-CM | POA: Diagnosis not present

## 2020-03-07 DIAGNOSIS — N189 Chronic kidney disease, unspecified: Secondary | ICD-10-CM | POA: Diagnosis not present

## 2020-03-07 DIAGNOSIS — C679 Malignant neoplasm of bladder, unspecified: Secondary | ICD-10-CM | POA: Diagnosis not present

## 2020-03-28 DIAGNOSIS — L290 Pruritus ani: Secondary | ICD-10-CM | POA: Diagnosis not present

## 2020-04-05 ENCOUNTER — Other Ambulatory Visit: Payer: Self-pay

## 2020-04-05 DIAGNOSIS — G8324 Monoplegia of upper limb affecting left nondominant side: Secondary | ICD-10-CM | POA: Diagnosis present

## 2020-04-05 DIAGNOSIS — I10 Essential (primary) hypertension: Secondary | ICD-10-CM | POA: Diagnosis not present

## 2020-04-05 DIAGNOSIS — N179 Acute kidney failure, unspecified: Secondary | ICD-10-CM | POA: Diagnosis not present

## 2020-04-05 DIAGNOSIS — R531 Weakness: Secondary | ICD-10-CM | POA: Diagnosis not present

## 2020-04-05 DIAGNOSIS — I252 Old myocardial infarction: Secondary | ICD-10-CM

## 2020-04-05 DIAGNOSIS — L409 Psoriasis, unspecified: Secondary | ICD-10-CM | POA: Diagnosis present

## 2020-04-05 DIAGNOSIS — F419 Anxiety disorder, unspecified: Secondary | ICD-10-CM | POA: Diagnosis present

## 2020-04-05 DIAGNOSIS — Z7901 Long term (current) use of anticoagulants: Secondary | ICD-10-CM

## 2020-04-05 DIAGNOSIS — Z20822 Contact with and (suspected) exposure to covid-19: Secondary | ICD-10-CM | POA: Diagnosis present

## 2020-04-05 DIAGNOSIS — Z7982 Long term (current) use of aspirin: Secondary | ICD-10-CM

## 2020-04-05 DIAGNOSIS — Z96653 Presence of artificial knee joint, bilateral: Secondary | ICD-10-CM | POA: Diagnosis present

## 2020-04-05 DIAGNOSIS — Z881 Allergy status to other antibiotic agents status: Secondary | ICD-10-CM

## 2020-04-05 DIAGNOSIS — I5031 Acute diastolic (congestive) heart failure: Secondary | ICD-10-CM | POA: Diagnosis present

## 2020-04-05 DIAGNOSIS — D696 Thrombocytopenia, unspecified: Secondary | ICD-10-CM | POA: Diagnosis present

## 2020-04-05 DIAGNOSIS — Z7902 Long term (current) use of antithrombotics/antiplatelets: Secondary | ICD-10-CM

## 2020-04-05 DIAGNOSIS — Z515 Encounter for palliative care: Secondary | ICD-10-CM | POA: Diagnosis not present

## 2020-04-05 DIAGNOSIS — N390 Urinary tract infection, site not specified: Secondary | ICD-10-CM | POA: Diagnosis present

## 2020-04-05 DIAGNOSIS — Z882 Allergy status to sulfonamides status: Secondary | ICD-10-CM

## 2020-04-05 DIAGNOSIS — I130 Hypertensive heart and chronic kidney disease with heart failure and stage 1 through stage 4 chronic kidney disease, or unspecified chronic kidney disease: Secondary | ICD-10-CM | POA: Diagnosis present

## 2020-04-05 DIAGNOSIS — R188 Other ascites: Secondary | ICD-10-CM | POA: Diagnosis not present

## 2020-04-05 DIAGNOSIS — K659 Peritonitis, unspecified: Secondary | ICD-10-CM | POA: Diagnosis present

## 2020-04-05 DIAGNOSIS — E871 Hypo-osmolality and hyponatremia: Secondary | ICD-10-CM | POA: Diagnosis present

## 2020-04-05 DIAGNOSIS — I482 Chronic atrial fibrillation, unspecified: Secondary | ICD-10-CM | POA: Diagnosis present

## 2020-04-05 DIAGNOSIS — J449 Chronic obstructive pulmonary disease, unspecified: Secondary | ICD-10-CM | POA: Diagnosis present

## 2020-04-05 DIAGNOSIS — I6340 Cerebral infarction due to embolism of unspecified cerebral artery: Principal | ICD-10-CM | POA: Diagnosis present

## 2020-04-05 DIAGNOSIS — Z9221 Personal history of antineoplastic chemotherapy: Secondary | ICD-10-CM

## 2020-04-05 DIAGNOSIS — Z961 Presence of intraocular lens: Secondary | ICD-10-CM | POA: Diagnosis present

## 2020-04-05 DIAGNOSIS — R601 Generalized edema: Secondary | ICD-10-CM | POA: Diagnosis present

## 2020-04-05 DIAGNOSIS — Z79899 Other long term (current) drug therapy: Secondary | ICD-10-CM

## 2020-04-05 DIAGNOSIS — Z9841 Cataract extraction status, right eye: Secondary | ICD-10-CM

## 2020-04-05 DIAGNOSIS — M170 Bilateral primary osteoarthritis of knee: Secondary | ICD-10-CM | POA: Diagnosis present

## 2020-04-05 DIAGNOSIS — Z9109 Other allergy status, other than to drugs and biological substances: Secondary | ICD-10-CM

## 2020-04-05 DIAGNOSIS — Z66 Do not resuscitate: Secondary | ICD-10-CM | POA: Diagnosis not present

## 2020-04-05 DIAGNOSIS — Z87891 Personal history of nicotine dependence: Secondary | ICD-10-CM

## 2020-04-05 DIAGNOSIS — Z950 Presence of cardiac pacemaker: Secondary | ICD-10-CM

## 2020-04-05 DIAGNOSIS — Z923 Personal history of irradiation: Secondary | ICD-10-CM

## 2020-04-05 DIAGNOSIS — K219 Gastro-esophageal reflux disease without esophagitis: Secondary | ICD-10-CM | POA: Diagnosis present

## 2020-04-05 DIAGNOSIS — D631 Anemia in chronic kidney disease: Secondary | ICD-10-CM | POA: Diagnosis present

## 2020-04-05 DIAGNOSIS — G2581 Restless legs syndrome: Secondary | ICD-10-CM | POA: Diagnosis present

## 2020-04-05 DIAGNOSIS — Z885 Allergy status to narcotic agent status: Secondary | ICD-10-CM

## 2020-04-05 DIAGNOSIS — Z8551 Personal history of malignant neoplasm of bladder: Secondary | ICD-10-CM

## 2020-04-05 DIAGNOSIS — R739 Hyperglycemia, unspecified: Secondary | ICD-10-CM | POA: Diagnosis present

## 2020-04-05 DIAGNOSIS — E8809 Other disorders of plasma-protein metabolism, not elsewhere classified: Secondary | ICD-10-CM | POA: Diagnosis present

## 2020-04-05 DIAGNOSIS — R29709 NIHSS score 9: Secondary | ICD-10-CM | POA: Diagnosis present

## 2020-04-05 DIAGNOSIS — N1831 Chronic kidney disease, stage 3a: Secondary | ICD-10-CM | POA: Diagnosis present

## 2020-04-05 DIAGNOSIS — I2510 Atherosclerotic heart disease of native coronary artery without angina pectoris: Secondary | ICD-10-CM | POA: Diagnosis present

## 2020-04-05 DIAGNOSIS — Z955 Presence of coronary angioplasty implant and graft: Secondary | ICD-10-CM

## 2020-04-05 DIAGNOSIS — Z87442 Personal history of urinary calculi: Secondary | ICD-10-CM

## 2020-04-05 LAB — CBC
HCT: 39.7 % (ref 39.0–52.0)
Hemoglobin: 12.9 g/dL — ABNORMAL LOW (ref 13.0–17.0)
MCH: 30.5 pg (ref 26.0–34.0)
MCHC: 32.5 g/dL (ref 30.0–36.0)
MCV: 93.9 fL (ref 80.0–100.0)
Platelets: 298 10*3/uL (ref 150–400)
RBC: 4.23 MIL/uL (ref 4.22–5.81)
RDW: 16.3 % — ABNORMAL HIGH (ref 11.5–15.5)
WBC: 6.4 10*3/uL (ref 4.0–10.5)
nRBC: 0 % (ref 0.0–0.2)

## 2020-04-05 LAB — URINALYSIS, COMPLETE (UACMP) WITH MICROSCOPIC
Bilirubin Urine: NEGATIVE
Glucose, UA: NEGATIVE mg/dL
Hgb urine dipstick: NEGATIVE
Ketones, ur: NEGATIVE mg/dL
Leukocytes,Ua: NEGATIVE
Nitrite: POSITIVE — AB
Protein, ur: 30 mg/dL — AB
Specific Gravity, Urine: 1.023 (ref 1.005–1.030)
pH: 5 (ref 5.0–8.0)

## 2020-04-05 LAB — BASIC METABOLIC PANEL
Anion gap: 11 (ref 5–15)
BUN: 39 mg/dL — ABNORMAL HIGH (ref 8–23)
CO2: 20 mmol/L — ABNORMAL LOW (ref 22–32)
Calcium: 8.4 mg/dL — ABNORMAL LOW (ref 8.9–10.3)
Chloride: 102 mmol/L (ref 98–111)
Creatinine, Ser: 2.11 mg/dL — ABNORMAL HIGH (ref 0.61–1.24)
GFR calc Af Amer: 33 mL/min — ABNORMAL LOW (ref >60)
GFR calc non Af Amer: 28 mL/min — ABNORMAL LOW (ref >60)
Glucose, Bld: 121 mg/dL — ABNORMAL HIGH (ref 70–99)
Potassium: 4.1 mmol/L (ref 3.5–5.1)
Sodium: 133 mmol/L — ABNORMAL LOW (ref 135–145)

## 2020-04-05 NOTE — ED Triage Notes (Signed)
Pt comes POV with weakness for 2 months. States that over the past few days it has gotten worse and he can't walk around well.

## 2020-04-06 ENCOUNTER — Inpatient Hospital Stay: Payer: Medicare HMO

## 2020-04-06 ENCOUNTER — Emergency Department: Payer: Medicare HMO

## 2020-04-06 ENCOUNTER — Inpatient Hospital Stay
Admit: 2020-04-06 | Discharge: 2020-04-06 | Disposition: A | Discharge status: Home / Self Care | Payer: Medicare HMO | Attending: Internal Medicine | Admitting: Internal Medicine

## 2020-04-06 ENCOUNTER — Inpatient Hospital Stay
Admission: EM | Admit: 2020-04-06 | Discharge: 2020-05-05 | DRG: 064 | Disposition: E | Payer: Medicare HMO | Attending: Internal Medicine | Admitting: Internal Medicine

## 2020-04-06 DIAGNOSIS — R609 Edema, unspecified: Secondary | ICD-10-CM | POA: Diagnosis not present

## 2020-04-06 DIAGNOSIS — I639 Cerebral infarction, unspecified: Secondary | ICD-10-CM | POA: Diagnosis not present

## 2020-04-06 DIAGNOSIS — I130 Hypertensive heart and chronic kidney disease with heart failure and stage 1 through stage 4 chronic kidney disease, or unspecified chronic kidney disease: Secondary | ICD-10-CM | POA: Diagnosis present

## 2020-04-06 DIAGNOSIS — R0602 Shortness of breath: Secondary | ICD-10-CM

## 2020-04-06 DIAGNOSIS — Z8551 Personal history of malignant neoplasm of bladder: Secondary | ICD-10-CM | POA: Diagnosis not present

## 2020-04-06 DIAGNOSIS — I10 Essential (primary) hypertension: Secondary | ICD-10-CM | POA: Diagnosis not present

## 2020-04-06 DIAGNOSIS — N400 Enlarged prostate without lower urinary tract symptoms: Secondary | ICD-10-CM | POA: Diagnosis not present

## 2020-04-06 DIAGNOSIS — D696 Thrombocytopenia, unspecified: Secondary | ICD-10-CM

## 2020-04-06 DIAGNOSIS — D631 Anemia in chronic kidney disease: Secondary | ICD-10-CM | POA: Diagnosis not present

## 2020-04-06 DIAGNOSIS — J9811 Atelectasis: Secondary | ICD-10-CM | POA: Diagnosis not present

## 2020-04-06 DIAGNOSIS — I6340 Cerebral infarction due to embolism of unspecified cerebral artery: Secondary | ICD-10-CM | POA: Diagnosis not present

## 2020-04-06 DIAGNOSIS — J811 Chronic pulmonary edema: Secondary | ICD-10-CM | POA: Diagnosis not present

## 2020-04-06 DIAGNOSIS — Z515 Encounter for palliative care: Secondary | ICD-10-CM | POA: Diagnosis not present

## 2020-04-06 DIAGNOSIS — I4891 Unspecified atrial fibrillation: Secondary | ICD-10-CM | POA: Diagnosis present

## 2020-04-06 DIAGNOSIS — I509 Heart failure, unspecified: Secondary | ICD-10-CM | POA: Diagnosis not present

## 2020-04-06 DIAGNOSIS — I2510 Atherosclerotic heart disease of native coronary artery without angina pectoris: Secondary | ICD-10-CM | POA: Diagnosis present

## 2020-04-06 DIAGNOSIS — K8689 Other specified diseases of pancreas: Secondary | ICD-10-CM | POA: Diagnosis not present

## 2020-04-06 DIAGNOSIS — C679 Malignant neoplasm of bladder, unspecified: Secondary | ICD-10-CM | POA: Diagnosis not present

## 2020-04-06 DIAGNOSIS — Z20822 Contact with and (suspected) exposure to covid-19: Secondary | ICD-10-CM | POA: Diagnosis not present

## 2020-04-06 DIAGNOSIS — N179 Acute kidney failure, unspecified: Secondary | ICD-10-CM | POA: Diagnosis not present

## 2020-04-06 DIAGNOSIS — E8809 Other disorders of plasma-protein metabolism, not elsewhere classified: Secondary | ICD-10-CM | POA: Diagnosis present

## 2020-04-06 DIAGNOSIS — K659 Peritonitis, unspecified: Secondary | ICD-10-CM | POA: Diagnosis not present

## 2020-04-06 DIAGNOSIS — R188 Other ascites: Secondary | ICD-10-CM | POA: Diagnosis not present

## 2020-04-06 DIAGNOSIS — I480 Paroxysmal atrial fibrillation: Secondary | ICD-10-CM | POA: Diagnosis not present

## 2020-04-06 DIAGNOSIS — N390 Urinary tract infection, site not specified: Secondary | ICD-10-CM | POA: Diagnosis not present

## 2020-04-06 DIAGNOSIS — R338 Other retention of urine: Secondary | ICD-10-CM | POA: Diagnosis not present

## 2020-04-06 DIAGNOSIS — J90 Pleural effusion, not elsewhere classified: Secondary | ICD-10-CM | POA: Diagnosis not present

## 2020-04-06 DIAGNOSIS — R531 Weakness: Secondary | ICD-10-CM | POA: Diagnosis not present

## 2020-04-06 DIAGNOSIS — N1831 Chronic kidney disease, stage 3a: Secondary | ICD-10-CM | POA: Diagnosis not present

## 2020-04-06 DIAGNOSIS — I4811 Longstanding persistent atrial fibrillation: Secondary | ICD-10-CM | POA: Diagnosis not present

## 2020-04-06 DIAGNOSIS — R29818 Other symptoms and signs involving the nervous system: Secondary | ICD-10-CM | POA: Diagnosis not present

## 2020-04-06 DIAGNOSIS — K7031 Alcoholic cirrhosis of liver with ascites: Secondary | ICD-10-CM | POA: Diagnosis not present

## 2020-04-06 DIAGNOSIS — R29709 NIHSS score 9: Secondary | ICD-10-CM | POA: Diagnosis not present

## 2020-04-06 DIAGNOSIS — N281 Cyst of kidney, acquired: Secondary | ICD-10-CM | POA: Diagnosis not present

## 2020-04-06 DIAGNOSIS — R739 Hyperglycemia, unspecified: Secondary | ICD-10-CM | POA: Diagnosis present

## 2020-04-06 DIAGNOSIS — I6521 Occlusion and stenosis of right carotid artery: Secondary | ICD-10-CM | POA: Diagnosis not present

## 2020-04-06 DIAGNOSIS — E871 Hypo-osmolality and hyponatremia: Secondary | ICD-10-CM | POA: Diagnosis present

## 2020-04-06 DIAGNOSIS — Z87891 Personal history of nicotine dependence: Secondary | ICD-10-CM | POA: Diagnosis not present

## 2020-04-06 DIAGNOSIS — Z9221 Personal history of antineoplastic chemotherapy: Secondary | ICD-10-CM | POA: Diagnosis not present

## 2020-04-06 DIAGNOSIS — Z66 Do not resuscitate: Secondary | ICD-10-CM | POA: Diagnosis not present

## 2020-04-06 DIAGNOSIS — I7090 Unspecified atherosclerosis: Secondary | ICD-10-CM | POA: Diagnosis not present

## 2020-04-06 DIAGNOSIS — R601 Generalized edema: Secondary | ICD-10-CM | POA: Diagnosis not present

## 2020-04-06 DIAGNOSIS — I129 Hypertensive chronic kidney disease with stage 1 through stage 4 chronic kidney disease, or unspecified chronic kidney disease: Secondary | ICD-10-CM | POA: Diagnosis not present

## 2020-04-06 DIAGNOSIS — I63411 Cerebral infarction due to embolism of right middle cerebral artery: Secondary | ICD-10-CM | POA: Diagnosis not present

## 2020-04-06 DIAGNOSIS — I482 Chronic atrial fibrillation, unspecified: Secondary | ICD-10-CM | POA: Diagnosis present

## 2020-04-06 DIAGNOSIS — I5031 Acute diastolic (congestive) heart failure: Secondary | ICD-10-CM | POA: Diagnosis not present

## 2020-04-06 LAB — ECHOCARDIOGRAM COMPLETE
AR max vel: 1.89 cm2
AV Area VTI: 2.01 cm2
AV Area mean vel: 1.93 cm2
AV Mean grad: 6.5 mmHg
AV Peak grad: 11 mmHg
Ao pk vel: 1.66 m/s
Area-P 1/2: 3.06 cm2
S' Lateral: 2.59 cm

## 2020-04-06 LAB — URINE CULTURE
Culture: <10000 — AB
Special Requests: NORMAL

## 2020-04-06 LAB — GLUCOSE, PLEURAL OR PERITONEAL FLUID: Glucose, Fluid: 28 mg/dL

## 2020-04-06 LAB — HEPATIC FUNCTION PANEL
ALT: 18 U/L (ref 0–44)
AST: 23 U/L (ref 15–41)
Albumin: 2.7 g/dL — ABNORMAL LOW (ref 3.5–5.0)
Alkaline Phosphatase: 44 U/L (ref 38–126)
Bilirubin, Direct: 0.2 mg/dL (ref 0.0–0.2)
Indirect Bilirubin: 0.6 mg/dL (ref 0.3–0.9)
Total Bilirubin: 0.8 mg/dL (ref 0.3–1.2)
Total Protein: 5.7 g/dL — ABNORMAL LOW (ref 6.5–8.1)

## 2020-04-06 LAB — LIPASE, BLOOD: Lipase: 27 U/L (ref 11–51)

## 2020-04-06 LAB — ALBUMIN, PLEURAL OR PERITONEAL FLUID: Albumin, Fluid: 1.5 g/dL

## 2020-04-06 LAB — BODY FLUID CELL COUNT WITH DIFFERENTIAL
Eos, Fluid: 0 %
Lymphs, Fluid: 8 %
Monocyte-Macrophage-Serous Fluid: 80 %
Neutrophil Count, Fluid: 12 %
Total Nucleated Cell Count, Fluid: 4386 cu mm

## 2020-04-06 LAB — SARS CORONAVIRUS 2 BY RT PCR (HOSPITAL ORDER, PERFORMED IN ~~LOC~~ HOSPITAL LAB): SARS Coronavirus 2: NEGATIVE

## 2020-04-06 LAB — LACTIC ACID, PLASMA: Lactic Acid, Venous: 1.5 mmol/L (ref 0.5–1.9)

## 2020-04-06 MED ORDER — VITAMIN B-12 1000 MCG PO TABS
1000.0000 ug | ORAL_TABLET | Freq: Every day | ORAL | Status: DC
  Administered 2020-04-07 – 2020-04-08 (×2): 1000 ug via ORAL
  Filled 2020-04-06 (×4): qty 1

## 2020-04-06 MED ORDER — SODIUM CHLORIDE 0.9 % IV SOLN
1.0000 g | INTRAVENOUS | Status: DC
  Administered 2020-04-06 – 2020-04-08 (×3): 1 g via INTRAVENOUS
  Filled 2020-04-06 (×3): qty 10
  Filled 2020-04-06: qty 1

## 2020-04-06 MED ORDER — FUROSEMIDE 10 MG/ML IJ SOLN
40.0000 mg | Freq: Two times a day (BID) | INTRAMUSCULAR | Status: DC
  Administered 2020-04-06 – 2020-04-07 (×2): 40 mg via INTRAVENOUS
  Filled 2020-04-06 (×2): qty 4

## 2020-04-06 MED ORDER — SODIUM CHLORIDE 0.9 % IV BOLUS
1000.0000 mL | Freq: Once | INTRAVENOUS | Status: AC
  Administered 2020-04-06: 1000 mL via INTRAVENOUS

## 2020-04-06 MED ORDER — ALBUMIN HUMAN 25 % IV SOLN
25.0000 g | Freq: Once | INTRAVENOUS | Status: AC
  Administered 2020-04-06: 25 g via INTRAVENOUS
  Filled 2020-04-06: qty 100

## 2020-04-06 MED ORDER — SODIUM CHLORIDE 0.9% FLUSH
3.0000 mL | Freq: Two times a day (BID) | INTRAVENOUS | Status: DC
  Administered 2020-04-06 – 2020-04-09 (×6): 3 mL via INTRAVENOUS

## 2020-04-06 MED ORDER — HYDROCODONE-ACETAMINOPHEN 5-325 MG PO TABS: 1.0000 | ORAL_TABLET | Freq: Four times a day (QID) | ORAL | Status: DC | PRN

## 2020-04-06 MED ORDER — ONDANSETRON HCL 4 MG/2ML IJ SOLN: 4.0000 mg | Freq: Four times a day (QID) | INTRAMUSCULAR | Status: DC | PRN

## 2020-04-06 MED ORDER — SILDENAFIL CITRATE 20 MG PO TABS
20.0000 mg | ORAL_TABLET | Freq: Every day | ORAL | Status: DC
  Administered 2020-04-07: 20 mg via ORAL
  Filled 2020-04-06 (×2): qty 1

## 2020-04-06 MED ORDER — PANTOPRAZOLE SODIUM 40 MG PO TBEC
40.0000 mg | DELAYED_RELEASE_TABLET | Freq: Every day | ORAL | Status: DC
  Administered 2020-04-06 – 2020-04-08 (×3): 40 mg via ORAL
  Filled 2020-04-06 (×4): qty 1

## 2020-04-06 MED ORDER — CEPHALEXIN 500 MG PO CAPS
500.0000 mg | ORAL_CAPSULE | Freq: Once | ORAL | Status: AC
  Administered 2020-04-06: 500 mg via ORAL
  Filled 2020-04-06: qty 1

## 2020-04-06 MED ORDER — ONDANSETRON HCL 4 MG PO TABS: 4.0000 mg | ORAL_TABLET | Freq: Four times a day (QID) | ORAL | Status: DC | PRN

## 2020-04-06 MED ORDER — SODIUM CHLORIDE 0.9 % IV SOLN
250.0000 mL | INTRAVENOUS | Status: DC | PRN
  Administered 2020-04-10: 250 mL via INTRAVENOUS

## 2020-04-06 MED ORDER — SODIUM CHLORIDE 0.9 % IV SOLN: Freq: Once | INTRAVENOUS | Status: DC

## 2020-04-06 MED ORDER — AMIODARONE HCL 200 MG PO TABS
200.0000 mg | ORAL_TABLET | Freq: Every day | ORAL | Status: DC
  Administered 2020-04-06 – 2020-04-10 (×4): 200 mg via ORAL
  Filled 2020-04-06 (×4): qty 1

## 2020-04-06 MED ORDER — IOHEXOL 9 MG/ML PO SOLN
500.0000 mL | ORAL | Status: AC
  Administered 2020-04-06 (×2): 500 mL via ORAL

## 2020-04-06 MED ORDER — METOPROLOL TARTRATE 50 MG PO TABS: 50.0000 mg | ORAL_TABLET | Freq: Two times a day (BID) | ORAL | Status: DC

## 2020-04-06 MED ORDER — SODIUM CHLORIDE 0.9% FLUSH: 3.0000 mL | INTRAVENOUS | Status: DC | PRN

## 2020-04-06 MED ORDER — ATORVASTATIN CALCIUM 20 MG PO TABS
80.0000 mg | ORAL_TABLET | Freq: Every day | ORAL | Status: DC
  Administered 2020-04-06 – 2020-04-08 (×3): 80 mg via ORAL
  Filled 2020-04-06 (×3): qty 4

## 2020-04-06 MED ORDER — MIRTAZAPINE 15 MG PO TABS
15.0000 mg | ORAL_TABLET | Freq: Every day | ORAL | Status: DC
  Administered 2020-04-06: 15 mg via ORAL
  Filled 2020-04-06: qty 1

## 2020-04-06 MED ORDER — OMEGA-3-ACID ETHYL ESTERS 1 G PO CAPS
1.0000 g | ORAL_CAPSULE | Freq: Two times a day (BID) | ORAL | Status: DC
  Administered 2020-04-06 – 2020-04-08 (×5): 1 g via ORAL
  Filled 2020-04-06 (×7): qty 1

## 2020-04-06 MED ORDER — ACETAMINOPHEN ER 650 MG PO TBCR: 1300.0000 mg | EXTENDED_RELEASE_TABLET | Freq: Three times a day (TID) | ORAL | Status: DC | PRN

## 2020-04-06 MED ORDER — SPIRONOLACTONE 25 MG PO TABS
25.0000 mg | ORAL_TABLET | Freq: Every day | ORAL | Status: DC
  Administered 2020-04-06 – 2020-04-07 (×2): 25 mg via ORAL
  Filled 2020-04-06 (×2): qty 1

## 2020-04-06 NOTE — Progress Notes (Signed)
*  PRELIMINARY RESULTS* Echocardiogram 2D Echocardiogram has been performed.  Sherrie Sport 04/08/2020, 9:32 AM

## 2020-04-06 NOTE — ED Notes (Signed)
Pt transported to CT via wheelchair.

## 2020-04-06 NOTE — H&P (Signed)
History and Physical    Joseph Houston DOA: 04/23/2020  PCP: Tracie Harrier, MD   Patient coming from: Home  I have personally briefly reviewed patient's old medical records in [place]  Chief Complaint: Weakness  HPI: Joseph Houston is a 84 y.o. male with medical history significant for high-grade urothelial carcinoma of the bladder status post chemo and radiation therapy, history of A. fib on anticoagulation, history of coronary artery disease, hypertension, chronic kidney disease stage III who presents to the emergency room for evaluation of generalized weakness which has progressively worsened over the last couple of weeks and now patient has to use a cane for ambulation to reduce his risk for falls because he is unsteady on his feet.  He complains of abdominal pain which is diffuse but mostly in the periumbilical area associated with nausea and vomiting but denies having any changes in his bowel habits.  He has shortness of breath with exertion but denies having any chest pain, no cough, no fever, no chills.  He also stated that he has not voided in the last 12 hours and a bladder scan revealed 900 cc of urine. Labs revealed sodium 133, potassium 4.1, chloride 102, bicarb 20, BUN 29, creatinine 2.1 compared to baseline of 1.36, AST 23, ALT 18, lactic acid 1.5, hemoglobin 12.9, hematocrit 39.7, platelet count 298. CT scan of abdomen and pelvis showed large volume ascites which has seemingly increased from an outside CT report June 2021. There could be underlying liver cirrhosis.  Consider including cytology given history of bladder cancer. 12 Lead EKG reviewed by me shows atrial fibrillation with paced ventricular rate   ED Course: Patient is an 84 year old who presents to the emergency room for evaluation of weakness and abdominal pain.  He has a history of high-grade urothelial carcinoma and is status post chemo and radiation therapy. patient  is noted to have diffuse ascites and anasarca.  He also has pyuria.  Labs show worsening of his renal function from baseline 1.36 >> 2.1. Patient will be admitted to the hospital for further evaluation.  Review of Systems: As per HPI otherwise 10 point review of systems negative.   Past Medical History:  Diagnosis Date  . Anemia    was treated in the past, not now  . Anxiety   . Arthritis    knees  . BPH (benign prostatic hypertrophy)   . Chronic kidney disease    stage 3 ckd  . COPD (chronic obstructive pulmonary disease) ([place])    has had most of his life. has not used an inhaler for years  . Coronary artery disease   . Difficult intubation    pt reports bad sore throat after surgery. pt unsure of this response  . Dyspnea   . Dysrhythmia 2016   atrial fibrillation, bradycardia  . GERD (gastroesophageal reflux disease)   . Headache   . History of hiatal hernia   . History of kidney stones 04/2018  . Hypertension   . Pneumonia   . Pre-diabetes    dr. Ginette Pitman following  . Presence of permanent cardiac pacemaker 2017  . Psoriasis 2019   elbows  . Restless leg   . STEMI involving right coronary artery ([place]) 10/07/2019  . Vertigo 1992  . Yeast infection    in groin for 50 years    Past Surgical History:  Procedure Laterality Date  . CATARACT EXTRACTION W/PHACO Right 09/25/2016   Procedure: CATARACT EXTRACTION PHACO AND INTRAOCULAR LENS  PLACEMENT (IOC);  Surgeon: Estill Cotta, MD;  Location: [facility];  Service: Ophthalmology;  Laterality: Right;  [place] 01:58 AP% 18.1 CDE 43.36 Fluid pack # [id number]  . CORONARY STENT INTERVENTION N/A 10/06/2019   Procedure: CORONARY STENT INTERVENTION;  Surgeon: Yolonda Kida, MD;  Location: [place] [facility];  Service: Cardiovascular;  Laterality: N/A;  MID RCA  . CORONARY/GRAFT ACUTE MI REVASCULARIZATION N/A 10/06/2019   Procedure: Coronary/Graft Acute MI Revascularization;  Surgeon: Yolonda Kida, MD;  Location: [place] [facility];  Service: Cardiovascular;  Laterality: N/A;  . CYSTOSCOPY N/A 09/28/2019   Procedure: CYSTOSCOPY;  Surgeon: Abbie Sons, MD;  Location: [facility];  Service: Urology;  Laterality: N/A;  . CYSTOSCOPY/URETEROSCOPY/HOLMIUM LASER/STENT PLACEMENT Right 04/13/2018   Procedure: CYSTOSCOPY/URETEROSCOPY/HOLMIUM LASER/STENT PLACEMENT;  Surgeon: Hollice Espy, MD;  Location: [facility];  Service: Urology;  Laterality: Right;  . ELECTROPHYSIOLOGIC STUDY N/A 12/22/2014   Procedure: CARDIOVERSION;  Surgeon: Teodoro Spray, MD;  Location: [facility];  Service: Cardiovascular;  Laterality: N/A;  . FINGER SURGERY Right 1950   index finger cut off half way  . HERNIA REPAIR Bilateral    inguinal repaired x 4  . INSERT / REPLACE / REMOVE PACEMAKER     12/17  . JOINT REPLACEMENT Right 2012   partial knee replacement  . LEFT HEART CATH AND CORONARY ANGIOGRAPHY N/A 10/06/2019   Procedure: LEFT HEART CATH AND CORONARY ANGIOGRAPHY;  Surgeon: Yolonda Kida, MD;  Location: [place] [facility];  Service: Cardiovascular;  Laterality: N/A;  . PACEMAKER INSERTION Left 07/17/2016   Procedure: INSERTION PACEMAKER;  Surgeon: Isaias Cowman, MD;  Location: [facility];  Service: Cardiovascular;  Laterality: Left;  . PARTIAL KNEE ARTHROPLASTY Right 2012  . TONSILLECTOMY  1950  . TOTAL KNEE ARTHROPLASTY Left 09/30/2017   Procedure: TOTAL KNEE ARTHROPLASTY;  Surgeon: Hessie Knows, MD;  Location: [facility];  Service: Orthopedics;  Laterality: Left;  . TRANSURETHRAL RESECTION OF BLADDER TUMOR N/A 09/28/2019   Procedure: TRANSURETHRAL RESECTION OF BLADDER TUMOR (TURBT);  Surgeon: Abbie Sons, MD;  Location: [facility];  Service: Urology;  Laterality: N/A;  . TRANSURETHRAL RESECTION OF PROSTATE       reports that he quit smoking about 54 years ago. His smoking use included cigarettes. He smoked 2.00 packs per day. He has never used smokeless tobacco. He reports that he does not drink alcohol and does not use  drugs.  Allergies  Allergen Reactions  . Mold Extract [Trichophyton] Swelling and Other (See Comments)    Tightness in throat  . Dog Epithelium Swelling and Other (See Comments)    Also, cats too He is near the animals and does okay but allergy testing indicates that he   Is allergic to animals    . Other Swelling    Allergic to a variety of trees that surround his home. Allergy testing indicated that  this is an allergy. Took allergy shots for many years. Stopped 1 yr ago  . Tree Extract Swelling    Allergic to a variety of trees that surround his home. Allergy testing indicated that  this is an allergy.  Took allergy shots for many years. Stopped 1 yr ago  . Ciprofloxacin     Hallucinations   . Codeine Nausea And Vomiting  . Sulfa Antibiotics Nausea And Vomiting    History reviewed. No pertinent family history.   Prior to Admission medications   Medication Sig Start Date End Date Taking? Authorizing Provider  acetaminophen (TYLENOL) 650 MG CR  tablet Take 1,300 mg by mouth every 8 (eight) hours as needed for pain.    [provider]  amiodarone (PACERONE) 200 MG tablet Take 1 tablet (200 mg total) by mouth daily. 10/10/19   Jennye Boroughs, MD  apixaban (ELIQUIS) 5 MG TABS tablet TAKE 1 TABLET BY MOUTH EVERY 12 HOURS 09/20/19   [provider]  aspirin EC 81 MG tablet Take 81 mg by mouth daily.    [provider]  atorvastatin (LIPITOR) 80 MG tablet Take 1 tablet (80 mg total) by mouth daily at 6 PM. 10/09/19   Jennye Boroughs, MD  clopidogrel (PLAVIX) 75 MG tablet Take 1 tablet (75 mg total) by mouth daily. 10/10/19   Jennye Boroughs, MD  cyanocobalamin 1000 MCG tablet Take 1,000 mcg by mouth daily.     [provider]  HYDROcodone-acetaminophen (NORCO/VICODIN) 5-325 MG tablet Take 1 tablet by mouth every 6 (six) hours as needed for moderate pain. 09/28/19   Stoioff, Ronda Fairly, MD  losartan (COZAAR) 100 MG tablet Take 100 mg by mouth at bedtime.      [provider]  memantine (NAMENDA) 10 MG tablet Take by mouth 2 (two) times daily.  10/20/18 10/20/19  [provider]  metoprolol tartrate (LOPRESSOR) 50 MG tablet Take 1 tablet (50 mg total) by mouth 2 (two) times daily. 10/09/19   Jennye Boroughs, MD  mirtazapine (REMERON) 15 MG tablet Take 15 mg by mouth at bedtime. 10/06/19   [provider]  Omega-3 Fatty Acids (FISH OIL PO) Take 1 capsule by mouth 2 (two) times daily.    [provider]  omeprazole (PRILOSEC) 20 MG capsule 2 (two) times daily before a meal.  12/15/17   [provider]  sildenafil (REVATIO) 20 MG tablet Take 20 mg by mouth.  10/09/18   [provider]    Physical Exam: Vitals:   04/09/2020 0656 04/07/2020 0700 04/08/2020 0730 05/04/2020 0800  BP:  132/78 134/84 125/78  Pulse: 84 74 70 64  Resp: 17     Temp:      TempSrc:      SpO2: 100% 98% 98% 97%  Weight:      Height:         Vitals:   04/09/2020 0656 04/15/2020 0700 04/19/2020 0730 04/26/2020 0800  BP:  132/78 134/84 125/78  Pulse: 84 74 70 64  Resp: 17     Temp:      TempSrc:      SpO2: 100% 98% 98% 97%  Weight:      Height:        Constitutional: NAD, alert and oriented x 3.  Chronically ill-appearing, temporal wasting Eyes: PERRL, lids and conjunctivae pallor ENMT: Mucous membranes are moist.  Neck: normal, supple, no masses, no thyromegaly Respiratory: clear to auscultation bilaterally, no wheezing, crackles at the bases. Normal respiratory effort. No accessory muscle use.  Cardiovascular: Regular rate and rhythm, no murmurs / rubs / gallops. 3+ lower extremity edema . 2+ pedal pulses. No carotid bruits.  Abdomen: Periumbilical tenderness, no masses palpated. No hepatosplenomegaly. Bowel sounds positive.  Distended Musculoskeletal: no clubbing / cyanosis. No joint deformity upper and lower extremities.  Skin: no rashes, lesions, ulcers.  Neurologic: Weakness Psychiatric: Normal mood and affect.   Labs on  Admission: I have personally reviewed following labs and imaging studies  CBC: Recent Labs  Lab 04/05/20 1305  WBC 6.4  HGB 12.9*  HCT 39.7  MCV 93.9  PLT 962   Basic Metabolic Panel:  Recent Labs  Lab 04/05/20 1305  NA 133*  K 4.1  CL 102  CO2 20*  GLUCOSE 121*  BUN 39*  CREATININE 2.11*  CALCIUM 8.4*   GFR: Estimated Creatinine Clearance: 33.3 mL/min (A) (by C-G formula based on SCr of 2.11 mg/dL (H)). Liver Function Tests: Recent Labs  Lab 05/02/2020 0158  AST 23  ALT 18  ALKPHOS 44  BILITOT 0.8  PROT 5.7*  ALBUMIN 2.7*   Recent Labs  Lab 04/24/2020 0158  LIPASE 27   No results for input(s): AMMONIA in the last 168 hours. Coagulation Profile: No results for input(s): INR, PROTIME in the last 168 hours. Cardiac Enzymes: No results for input(s): CKTOTAL, CKMB, CKMBINDEX, TROPONINI in the last 168 hours. BNP (last 3 results) No results for input(s): PROBNP in the last 8760 hours. HbA1C: No results for input(s): HGBA1C in the last 72 hours. CBG: No results for input(s): GLUCAP in the last 168 hours. Lipid Profile: No results for input(s): CHOL, HDL, LDLCALC, TRIG, CHOLHDL, LDLDIRECT in the last 72 hours. Thyroid Function Tests: No results for input(s): TSH, T4TOTAL, FREET4, T3FREE, THYROIDAB in the last 72 hours. Anemia Panel: No results for input(s): VITAMINB12, FOLATE, FERRITIN, TIBC, IRON, RETICCTPCT in the last 72 hours. Urine analysis:    Component Value Date/Time   COLORURINE AMBER (A) 04/05/2020 1303   APPEARANCEUR HAZY (A) 04/05/2020 1303   APPEARANCEUR Cloudy (A) 10/26/2019 1120   LABSPEC 1.023 04/05/2020 1303   PHURINE 5.0 04/05/2020 1303   GLUCOSEU NEGATIVE 04/05/2020 1303   HGBUR NEGATIVE 04/05/2020 1303   BILIRUBINUR NEGATIVE 04/05/2020 1303   BILIRUBINUR Negative 10/26/2019 1120   KETONESUR NEGATIVE 04/05/2020 1303   PROTEINUR 30 (A) 04/05/2020 1303   NITRITE POSITIVE (A) 04/05/2020 1303   LEUKOCYTESUR NEGATIVE 04/05/2020 1303     Radiological Exams on Admission: CT ABDOMEN PELVIS WO CONTRAST  Result Date: 04/12/2020 CLINICAL DATA:  Abdominal distension and nonlocalized abdominal pain. EXAM: CT ABDOMEN AND PELVIS WITHOUT CONTRAST TECHNIQUE: Multidetector CT imaging of the abdomen and pelvis was performed following the standard protocol without IV contrast. COMPARISON:  09/06/2019 and abdominal CT report from [facility] 01/14/2020 FINDINGS: Lower chest: Generous heart size. Dual-chamber pacer leads. Small pleural effusions on the left more than right with calcified pleural plaques. Hepatobiliary: Small appearance of the liver with some surface lobulation and caudate/fissure enlargement.Mild high-density at the dependent gallbladder without calcified stone or acute inflammatory finding Pancreas: Generalized fatty atrophy. Spleen: Unremarkable. Adrenals/Urinary Tract: Negative adrenals. Cystic densities on both sides of the renal cortex. Anterior and right lateral asymmetric bladder wall thickening. Although imperceptible on axial slices there is an intact posterior bladder wall by reformats Stomach/Bowel: No obstruction. No visible bowel inflammation. Proctitis by recent sigmoidoscopy, unremarkable by CT Vascular/Lymphatic: Multifocal atheromatous calcification. No mass or adenopathy. Reproductive:Mildly enlarged prostate. Other: Large volume ascites. There is reticulation of the greater omentum without nodularity or discrete masslike finding. Bilateral inguinal hernia repair. Musculoskeletal: No acute abnormalities. Lumbar spine degeneration, focally advanced at L4-5. IMPRESSION: 1. Large volume ascites which has seemingly increased from a outside CT report June 2021. There could be underlying liver cirrhosis. At sampling, consider including cytology given history of bladder cancer. 2. Small pleural effusions. 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 Electronically Signed   By: Monte Fantasia M.D.    On: 04/26/2020 05:42    EKG: Independently reviewed.  Atrial fibrillation with paced ventricular rate  Assessment/Plan Principal Problem:   UTI (urinary tract infection) Active  Problems:   A-fib ([place])   Bladder carcinoma ([place])   AKI (acute kidney injury) ([place])   Coronary artery disease   Acute urinary retention   Anasarca   Ascites   Urinary tract infection with acute urinary retention Patient complains of difficulty voiding and bladder scan reveals > 900cc of urine Patient also has pyuria We will place Foley catheter Treat patient empirically with Rocephin 1 g IV daily until urine culture results become available   Anasarca Unclear etiology may be from a cardiac/renal origin Obtain 2D echocardiogram to assess LVEF Place patient on Lasix 40 mg IV every 12 Consult nephrology   AKI At baseline patient has a serum creatinine of 1.36 >> 2.11 Imaging is negative for obstructive uropathy We will request nephrology consult   Ascites New onset No known history of liver cirrhosis Patient has a history of bladder cancer, ??  Ascites secondary to metastatic disease Patient will need paracentesis and ascitic fluid will be sent for cytology   History of atrial fibrillation Rate controlled Patient is on apixaban as primary prophylaxis for an acute stroke We will hold apixaban for now for anticipated paracentesis   History of coronary artery disease Status post stent angioplasty in 03/21 Continue Plavix, metoprolol and statins Per cardiology patient does not need dual antiplatelet therapy since he is also on apixaban    History of bladder cancer Patient was diagnosed with high-grade urothelial carcinoma and is status post radiation and chemotherapy Follow-up with oncology as an outpatient        DVT prophylaxis: SCD Code Status: Full code Family Communication: Greater than 50% of time was spent discussing patient's condition and plan of care with him and his wife  at the bedside.  All questions and concerns have been addressed.  They verbalized understanding and agreed with the plan.  CODE STATUS was discussed and he wishes to be a Full code Disposition Plan: Back to previous home environment Consults called: Nephrology    Tameko Halder MD [facility]     05/01/2020, 9:04 AM

## 2020-04-06 NOTE — Progress Notes (Signed)
Paracentesis completed without complication.Approximately 5L of cloudy fluid removed.Post procedure hemostasis achieved.No bleeding.Vss.Abd benign.Pt transported back to ER in stable condition.F/u with his MD.Thankyou.

## 2020-04-06 NOTE — ED Provider Notes (Signed)
[facility] Emergency Department Provider Note  ____________________________________________   First MD Initiated Contact with Patient 04/13/2020 0102     (approximate)  I have reviewed the triage vital signs and the nursing notes.   HISTORY  Chief Complaint Weakness  Level 5 caveat: Patient and his wife are vague/poor historians.  HPI Joseph Houston is a 84 y.o. male with medical history as listed below who presents for evaluation of gradually worsening weakness over the last several months.  He has still been trying to recover  chemotherapy and radiation treatments.  He has had recurrent issues with his anus and rectum for which she has seen his primary care doctor after the radiation treatment but that is at baseline.  He presents tonight for what he describes as generalized weakness.  He has a difficult time getting up and moving around.  His wife reports that he now needs to use a cane to ambulate and he asked to pick up his legs and move them around when he gets in and out of the car.  He is able to ambulate but is unsteady when he does so.  He denies fever/chills, sore throat, chest pain, shortness of breath, cough, nausea, and vomiting.  He says that he has pain all throughout his abdomen I cannot specify where it is occurring.  Gradually over the last few weeks to months, he has developed substantial abdominal swelling and does not know where that has come from.  Nothing in particular makes his symptoms better or worse and he and his wife describe them as severe.     Past Medical History:  Diagnosis Date  . Anemia    was treated in the past, not now  . Anxiety   . Arthritis    knees  . BPH (benign prostatic hypertrophy)   . Chronic kidney disease    stage 3 ckd  . COPD (chronic obstructive pulmonary disease) ([place])    has had most of his life. has not used an inhaler for years  . Coronary artery disease   . Difficult intubation    pt reports bad  sore throat after surgery. pt unsure of this response  . Dyspnea   . Dysrhythmia 2016   atrial fibrillation, bradycardia  . GERD (gastroesophageal reflux disease)   . Headache   . History of hiatal hernia   . History of kidney stones 04/2018  . Hypertension   . Pneumonia   . Pre-diabetes    dr. Ginette Pitman following  . Presence of permanent cardiac pacemaker 2017  . Psoriasis 2019   elbows  . Restless leg   . STEMI involving right coronary artery ([place]) 10/07/2019  . Vertigo 1992  . Yeast infection    in groin for 50 years    Patient Active Problem List   Diagnosis Date Noted  . AKI (acute kidney injury) ([place]) 04/19/2020  . STEMI involving right coronary artery ([place]) 10/07/2019  . Chronic anticoagulation 10/07/2019  . Bladder carcinoma ([place]) 10/07/2019  . STEMI (ST elevation myocardial infarction) ([place]) 10/07/2019  . Left knee pain 10/06/2017  . Primary localized osteoarthritis of left knee 09/30/2017  . Sick sinus syndrome ([place]) 07/17/2016  . Carpal tunnel syndrome of right wrist 04/09/2016  . Trigger little finger of right hand 04/09/2016  . Bradycardia 01/02/2016  . Elevated blood sugar 09/25/2015  . Bilateral hearing loss 04/20/2015  . A-fib ([place]) 12/22/2014  . 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 Acid reflux 12/22/2014  . BP (  high blood pressure) 12/22/2014  . Chronic anemia 11/08/2014  . Testis pain 10/29/2013    Past Surgical History:  Procedure Laterality Date  . CATARACT EXTRACTION W/PHACO Right 09/25/2016   Procedure: CATARACT EXTRACTION PHACO AND INTRAOCULAR LENS PLACEMENT (IOC);  Surgeon: Estill Cotta, MD;  Location: [facility];  Service: Ophthalmology;  Laterality: Right;  [place] 01:58 AP% 18.1 CDE 43.36 Fluid pack # [id number]  . CORONARY STENT INTERVENTION N/A 10/06/2019   Procedure: CORONARY STENT INTERVENTION;  Surgeon: Yolonda Kida, MD;  Location: [place] [facility];  Service: Cardiovascular;  Laterality: N/A;  MID RCA  . CORONARY/GRAFT ACUTE MI  REVASCULARIZATION N/A 10/06/2019   Procedure: Coronary/Graft Acute MI Revascularization;  Surgeon: Yolonda Kida, MD;  Location: [place] [facility];  Service: Cardiovascular;  Laterality: N/A;  . CYSTOSCOPY N/A 09/28/2019   Procedure: CYSTOSCOPY;  Surgeon: Abbie Sons, MD;  Location: [facility];  Service: Urology;  Laterality: N/A;  . CYSTOSCOPY/URETEROSCOPY/HOLMIUM LASER/STENT PLACEMENT Right 04/13/2018   Procedure: CYSTOSCOPY/URETEROSCOPY/HOLMIUM LASER/STENT PLACEMENT;  Surgeon: Hollice Espy, MD;  Location: [facility];  Service: Urology;  Laterality: Right;  . ELECTROPHYSIOLOGIC STUDY N/A 12/22/2014   Procedure: CARDIOVERSION;  Surgeon: Teodoro Spray, MD;  Location: [facility];  Service: Cardiovascular;  Laterality: N/A;  . FINGER SURGERY Right 1950   index finger cut off half way  . HERNIA REPAIR Bilateral    inguinal repaired x 4  . INSERT / REPLACE / REMOVE PACEMAKER     12/17  . JOINT REPLACEMENT Right 2012   partial knee replacement  . LEFT HEART CATH AND CORONARY ANGIOGRAPHY N/A 10/06/2019   Procedure: LEFT HEART CATH AND CORONARY ANGIOGRAPHY;  Surgeon: Yolonda Kida, MD;  Location: [place] [facility];  Service: Cardiovascular;  Laterality: N/A;  . PACEMAKER INSERTION Left 07/17/2016   Procedure: INSERTION PACEMAKER;  Surgeon: Isaias Cowman, MD;  Location: [facility];  Service: Cardiovascular;  Laterality: Left;  . PARTIAL KNEE ARTHROPLASTY Right 2012  . TONSILLECTOMY  1950  . TOTAL KNEE ARTHROPLASTY Left 09/30/2017   Procedure: TOTAL KNEE ARTHROPLASTY;  Surgeon: Hessie Knows, MD;  Location: [facility];  Service: Orthopedics;  Laterality: Left;  . TRANSURETHRAL RESECTION OF BLADDER TUMOR N/A 09/28/2019   Procedure: TRANSURETHRAL RESECTION OF BLADDER TUMOR (TURBT);  Surgeon: Abbie Sons, MD;  Location: [facility];  Service: Urology;  Laterality: N/A;  . TRANSURETHRAL RESECTION OF PROSTATE      Prior to Admission medications   Medication Sig Start Date End Date  Taking? Authorizing Provider  acetaminophen (TYLENOL) 650 MG CR tablet Take 1,300 mg by mouth every 8 (eight) hours as needed for pain.    [provider]  amiodarone (PACERONE) 200 MG tablet Take 1 tablet (200 mg total) by mouth daily. 10/10/19   Jennye Boroughs, MD  apixaban (ELIQUIS) 5 MG TABS tablet TAKE 1 TABLET BY MOUTH EVERY 12 HOURS 09/20/19   [provider]  aspirin EC 81 MG tablet Take 81 mg by mouth daily.    [provider]  atorvastatin (LIPITOR) 80 MG tablet Take 1 tablet (80 mg total) by mouth daily at 6 PM. 10/09/19   Jennye Boroughs, MD  clopidogrel (PLAVIX) 75 MG tablet Take 1 tablet (75 mg total) by mouth daily. 10/10/19   Jennye Boroughs, MD  cyanocobalamin 1000 MCG tablet Take 1,000 mcg by mouth daily.     [provider]  HYDROcodone-acetaminophen (NORCO/VICODIN) 5-325 MG tablet Take 1 tablet by mouth every 6 (six) hours as needed for moderate pain. 09/28/19  Stoioff, Ronda Fairly, MD  losartan (COZAAR) 100 MG tablet Take 100 mg by mouth at bedtime.     [provider]  memantine (NAMENDA) 10 MG tablet Take by mouth 2 (two) times daily.  10/20/18 10/20/19  [provider]  metoprolol tartrate (LOPRESSOR) 50 MG tablet Take 1 tablet (50 mg total) by mouth 2 (two) times daily. 10/09/19   Jennye Boroughs, MD  mirtazapine (REMERON) 15 MG tablet Take 15 mg by mouth at bedtime. 10/06/19   [provider]  Omega-3 Fatty Acids (FISH OIL PO) Take 1 capsule by mouth 2 (two) times daily.    [provider]  omeprazole (PRILOSEC) 20 MG capsule 2 (two) times daily before a meal.  12/15/17   [provider]  sildenafil (REVATIO) 20 MG tablet Take 20 mg by mouth.  10/09/18   [provider]    Allergies Mold extract [trichophyton], Dog epithelium, Other, Tree extract, Ciprofloxacin, Codeine, and Sulfa antibiotics  History reviewed. No pertinent family history.  Social History Social History   Tobacco Use  . Smoking  status: Former Smoker    Packs/day: 2.00    Types: Cigarettes    Quit date: 12/21/1965    Years since quitting: 54.3  . Smokeless tobacco: Never Used  Vaping Use  . Vaping Use: Never used  Substance Use Topics  . Alcohol use: No    Comment: last drink 1964  . Drug use: No    Review of Systems Constitutional: No fever/chills.  General malaise and fatigue.  Generalized weakness. Eyes: No visual changes. ENT: No sore throat. Cardiovascular: Denies chest pain. Respiratory: Denies shortness of breath. Gastrointestinal: Generalized abdominal pain and swelling. Genitourinary: Negative for dysuria. Musculoskeletal: Negative for neck pain.  Negative for back pain. Integumentary: Negative for rash. Neurological: Negative for headaches, focal weakness or numbness.   ____________________________________________   PHYSICAL EXAM:  VITAL SIGNS: ED Triage Vitals [04/05/20 1301]  Enc Vitals Group     BP 127/84     Pulse Rate 70     Resp 18     Temp 98.1 F (36.7 C)     Temp Source Oral     SpO2 99 %     Weight 101.6 kg (224 lb)     Height 1.803 m (5\' 11" )     Head Circumference      Peak Flow      Pain Score 0     Pain Loc      Pain Edu?      Excl. in [place]?     Constitutional: Alert and oriented.  No acute distress. Eyes: Conjunctivae are normal.  Head: Atraumatic. Nose: No congestion/rhinnorhea. Mouth/Throat: Patient is wearing a mask. Neck: No stridor.  No meningeal signs.   Cardiovascular: Normal rate, regular rhythm. Good peripheral circulation. Grossly normal heart sounds. Respiratory: Normal respiratory effort.  No retractions. Gastrointestinal: Soft but distended, appears most consistent with ascites.  Mild generalized tenderness to palpation throughout the abdomen, unable to localize any region of exquisite tenderness.  No RUQ tenderness, negative Murphy's signs.   Musculoskeletal: 1+ pitting edema of bilateral lower extremities. Neurologic:  Normal speech and  language. No gross focal neurologic deficits are appreciated.  Skin:  Skin is warm, dry and intact. Psychiatric: Mood and affect are normal. Speech and behavior are normal.  ____________________________________________   LABS (all labs ordered are listed, but only abnormal results are displayed)  Labs Reviewed  BASIC METABOLIC PANEL - Abnormal; Notable for the following components:  Result Value   Sodium 133 (*)    CO2 20 (*)    Glucose, Bld 121 (*)    BUN 39 (*)    Creatinine, Ser 2.11 (*)    Calcium 8.4 (*)    GFR calc non Af Amer 28 (*)    GFR calc Af Amer 33 (*)    All other components within normal limits  CBC - Abnormal; Notable for the following components:   Hemoglobin 12.9 (*)    RDW 16.3 (*)    All other components within normal limits  URINALYSIS, COMPLETE (UACMP) WITH MICROSCOPIC - Abnormal; Notable for the following components:   Color, Urine AMBER (*)    APPearance HAZY (*)    Protein, ur 30 (*)    Nitrite POSITIVE (*)    Bacteria, UA RARE (*)    All other components within normal limits  HEPATIC FUNCTION PANEL - Abnormal; Notable for the following components:   Total Protein 5.7 (*)    Albumin 2.7 (*)    All other components within normal limits  SARS CORONAVIRUS 2 BY RT PCR (HOSPITAL ORDER, [facility])  URINE CULTURE  LIPASE, BLOOD  LACTIC ACID, PLASMA   ____________________________________________  EKG  ED ECG REPORT I, Hinda Kehr, the attending physician, personally viewed and interpreted this ECG.  Date: 04/05/2020 EKG Time: 13: 11 Rate: 75 Rhythm: A. fib with occasional PVC QRS Axis: Left axis deviation Intervals: normal ST/T Wave abnormalities: Non-specific ST segment / T-wave changes, but no clear evidence of acute ischemia. Narrative Interpretation: no definitive evidence of acute ischemia; does not meet STEMI criteria.   ____________________________________________  RADIOLOGY I, Hinda Kehr,  personally viewed and evaluated these images (plain radiographs) as part of my medical decision making, as well as reviewing the written report by the radiologist.  ED MD interpretation:  Large volume ascites, no other acute abnormalities  Official radiology report(s): CT ABDOMEN PELVIS WO CONTRAST  Result Date: 04/30/2020 CLINICAL DATA:  Abdominal distension and nonlocalized abdominal pain. EXAM: CT ABDOMEN AND PELVIS WITHOUT CONTRAST TECHNIQUE: Multidetector CT imaging of the abdomen and pelvis was performed following the standard protocol without IV contrast. COMPARISON:  09/06/2019 and abdominal CT report from [facility] 01/14/2020 FINDINGS: Lower chest: Generous heart size. Dual-chamber pacer leads. Small pleural effusions on the left more than right with calcified pleural plaques. Hepatobiliary: Small appearance of the liver with some surface lobulation and caudate/fissure enlargement.Mild high-density at the dependent gallbladder without calcified stone or acute inflammatory finding Pancreas: Generalized fatty atrophy. Spleen: Unremarkable. Adrenals/Urinary Tract: Negative adrenals. Cystic densities on both sides of the renal cortex. Anterior and right lateral asymmetric bladder wall thickening. Although imperceptible on axial slices there is an intact posterior bladder wall by reformats Stomach/Bowel: No obstruction. No visible bowel inflammation. Proctitis by recent sigmoidoscopy, unremarkable by CT Vascular/Lymphatic: Multifocal atheromatous calcification. No mass or adenopathy. Reproductive:Mildly enlarged prostate. Other: Large volume ascites. There is reticulation of the greater omentum without nodularity or discrete masslike finding. Bilateral inguinal hernia repair. Musculoskeletal: No acute abnormalities. Lumbar spine degeneration, focally advanced at L4-5. IMPRESSION: 1. Large volume ascites which has seemingly increased from a outside CT report June 2021. There could be underlying liver cirrhosis.  At sampling, consider including cytology given history of bladder cancer. 2. Small pleural effusions. 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 Electronically Signed   By: Monte Fantasia M.D.   On: 04/07/2020 05:42    ____________________________________________   PROCEDURES  Procedure(s) performed (including Critical Care):  Procedures   ____________________________________________   INITIAL IMPRESSION / MDM / ASSESSMENT AND PLAN / ED COURSE  As part of my medical decision making, I reviewed the following data within the [place] History obtained from family, Nursing notes reviewed and incorporated, Labs reviewed , EKG interpreted , Old chart reviewed, Radiograph reviewed , Discussed with admitting physician (Dr. Tonie Griffith) and Notes from prior ED visits   Differential diagnosis includes, but is not limited to, general failure to thrive, electrolyte or metabolic abnormality, acute kidney injury, pneumonia, new development of ascites, heart failure.  The patient is on the cardiac monitor to evaluate for evidence of arrhythmia and/or significant heart rate changes.    The patient has least a degree of acute kidney injury with a substantially elevated creatinine over his baseline of about 1.2 (today is 2.1).  He has no leukocytosis.  He has a nitrite positive urinalysis with a few WBCs but is unclear how this is affected by his history of bladder cancer but it is worth treating empirically.  I have ordered Keflex 500 mg by mouth.  Urine culture is pending.  Lactic acid is 1.5.  Hepatic function panel is generally normal other than hypoalbuminemia.  COVID-19 test is normal.  Given his abdominal pain and distention, I have ordered a CT abdomen pelvis without IV contrast, with oral contrast, to try to identify and characterize both his presumed ascites as well as his abdominal pain and generalized tenderness.  SBP would be very  unlikely.  Most of the symptoms seem subacute and he has no focal neurological deficits.  I will reassess once the imaging is back.     Clinical Course as of Apr 06 653  Thu Apr 06, 2020  0320 Lactic Acid, Venous: 1.5 [CF]  3267 Large volume ascites, radiologist mentioned sending cytology for possible malignancy given history of bladder cancer.  Otherwise no acute abnormalities.  Given the patient's acute kidney injury with GFR down to 28, failure to thrive at home and generalized weakness, and large volume ascites concerning for malignancy, will consult the hospitalist for admission.  CT ABDOMEN PELVIS WO CONTRAST [CF]    Clinical Course User Index [CF] Hinda Kehr, MD     ____________________________________________  FINAL CLINICAL IMPRESSION(S) / ED DIAGNOSES  Final diagnoses:  Acute kidney injury ([place])  Generalized weakness  Urinary tract infection without hematuria, site unspecified  Other ascites     MEDICATIONS GIVEN DURING THIS VISIT:  Medications  0.9 %  sodium chloride infusion (has no administration in time range)  sodium chloride 0.9 % bolus 1,000 mL (0 mLs Intravenous Stopped 04/15/2020 0423)  iohexol (OMNIPAQUE) 9 MG/ML oral solution 500 mL (500 mLs Oral Contrast Given 05/04/2020 0400)  cephALEXin (KEFLEX) capsule 500 mg (500 mg Oral Given 04/14/2020 0422)     ED Discharge Orders    None      *Please note:  Joseph Houston was evaluated in Emergency Department on 04/10/2020 for the symptoms described in the history of present illness. He was evaluated in the context of the global COVID-19 pandemic, which necessitated consideration that the patient might be at risk for infection with the SARS-CoV-2 virus that causes COVID-19. Institutional protocols and algorithms that pertain to the evaluation of patients at risk for COVID-19 are in a state of rapid change based on information released by regulatory bodies including the CDC and federal and state organizations. These  policies and algorithms were followed during the patient's care  in the ED.  Some ED evaluations and interventions may be delayed as a result of limited staffing during and after the pandemic.*  Note:  This document was prepared using Dragon voice recognition software and may include unintentional dictation errors.   Hinda Kehr, MD 04/19/2020 [phone]

## 2020-04-06 NOTE — Progress Notes (Deleted)
Pt placed on 2LNC - O2 sat on RA dropping to mid 80s while pt sleeps. Pt 97% on 2L

## 2020-04-06 NOTE — Consult Note (Signed)
[facility]  CONSULT NOTE    Date: 04/30/2020                  Patient Name:  Joseph Houston  MRN: [id number]  DOB: 1936/05/05  Age / Sex: 85 y.o., male         PCP: Tracie Harrier, MD                 Service Requesting Consult: Dr. Francine Graven                 Reason for Consult: Acute renal failure            History of Present Illness: Mr. Niguel Moure Delay admitted to [facility] after having abdominal pain, weakness and nausea/vomiting. Patient states he is having progressive shortness of breath. He states that his abdominal girth has worsening. He claims to be taking all his medicaiton as prescribed. He is unsure if he is keeping a fluid restriction or low salt diet.    Medications: Outpatient medications: (Not in a hospital admission)   Current medications: Current Facility-Administered Medications  Medication Dose Route Frequency Provider Last Rate Last Admin  . 0.9 %  sodium chloride infusion  250 mL Intravenous PRN Agbata, Tochukwu, MD      . amiodarone (PACERONE) tablet 200 mg  200 mg Oral Daily Agbata, Tochukwu, MD      . atorvastatin (LIPITOR) tablet 80 mg  80 mg Oral q1800 Agbata, Tochukwu, MD      . cefTRIAXone (ROCEPHIN) 1 g in sodium chloride 0.9 % 100 mL IVPB  1 g Intravenous Q24H Agbata, Tochukwu, MD   Stopped at 04/10/2020 0901  . furosemide (LASIX) injection 40 mg  40 mg Intravenous Q12H Agbata, Tochukwu, MD      . HYDROcodone-acetaminophen (NORCO/VICODIN) 5-325 MG per tablet 1 tablet  1 tablet Oral Q6H PRN Agbata, Tochukwu, MD      . metoprolol tartrate (LOPRESSOR) tablet 50 mg  50 mg Oral BID Agbata, Tochukwu, MD      . mirtazapine (REMERON) tablet 15 mg  15 mg Oral QHS Agbata, Tochukwu, MD      . omega-3 acid ethyl esters (LOVAZA) capsule 1 g  1 g Oral BID Agbata, Tochukwu, MD      . ondansetron (ZOFRAN) tablet 4 mg  4 mg Oral Q6H PRN Agbata, Tochukwu, MD       Or  . ondansetron (ZOFRAN) injection 4 mg  4 mg Intravenous Q6H PRN Agbata,  Tochukwu, MD      . pantoprazole (PROTONIX) EC tablet 40 mg  40 mg Oral Daily Agbata, Tochukwu, MD      . sildenafil (REVATIO) tablet 20 mg  20 mg Oral Daily Agbata, Tochukwu, MD      . sodium chloride flush (NS) 0.9 % injection 3 mL  3 mL Intravenous Q12H Agbata, Tochukwu, MD      . sodium chloride flush (NS) 0.9 % injection 3 mL  3 mL Intravenous PRN Agbata, Tochukwu, MD      . vitamin B-12 (CYANOCOBALAMIN) tablet 1,000 mcg  1,000 mcg Oral Daily Agbata, Tochukwu, MD       Current Outpatient Medications  Medication Sig Dispense Refill  . acetaminophen (TYLENOL) 650 MG CR tablet Take 1,300 mg by mouth every 8 (eight) hours as needed for pain.    [name] amiodarone (PACERONE) 200 MG tablet Take 1 tablet (200 mg total) by mouth daily. 30 tablet 0  . amLODipine (NORVASC) 2.5 MG tablet Take 5  mg by mouth daily.    [name] aspirin EC 81 MG tablet Take 81 mg by mouth daily.    [name] atorvastatin (LIPITOR) 80 MG tablet Take 1 tablet (80 mg total) by mouth daily at 6 PM. 30 tablet 0  . clopidogrel (PLAVIX) 75 MG tablet Take 1 tablet (75 mg total) by mouth daily. 30 tablet 0  . cyanocobalamin 1000 MCG tablet Take 1,000 mcg by mouth daily.     [name] enoxaparin (LOVENOX) 80 MG/0.8ML injection Inject 0.8 mg into the skin every 12 (twelve) hours.    [name] losartan (COZAAR) 50 MG tablet Take 50 mg by mouth at bedtime.     . memantine (NAMENDA) 10 MG tablet Take 10 mg by mouth 2 (two) times daily.     . metoprolol tartrate (LOPRESSOR) 50 MG tablet Take 1 tablet (50 mg total) by mouth 2 (two) times daily. 60 tablet 0  . Omega-3 Fatty Acids (FISH OIL) 1000 MG CAPS Take 1 capsule by mouth 2 (two) times daily.     [name] omeprazole (PRILOSEC) 20 MG capsule Take 20 mg by mouth 2 (two) times daily before a meal.     . phenazopyridine (PYRIDIUM) 200 MG tablet Take 200 mg by mouth at bedtime.    . prochlorperazine (COMPAZINE) 10 MG tablet Take 10 mg by mouth every 6 (six) hours as needed for nausea/vomiting.        Allergies: Allergies   Allergen Reactions  . Mold Extract [Trichophyton] Swelling and Other (See Comments)    Tightness in throat  . Dog Epithelium Swelling and Other (See Comments)    Also, cats too He is near the animals and does okay but allergy testing indicates that he   Is allergic to animals    . Other Swelling    Allergic to a variety of trees that surround his home. Allergy testing indicated that  this is an allergy. Took allergy shots for many years. Stopped 1 yr ago  . Tree Extract Swelling    Allergic to a variety of trees that surround his home. Allergy testing indicated that  this is an allergy.  Took allergy shots for many years. Stopped 1 yr ago  . Ciprofloxacin     Hallucinations   . Codeine Nausea And Vomiting  . Sulfa Antibiotics Nausea And Vomiting      Past Medical History: Past Medical History:  Diagnosis Date  . Anemia    was treated in the past, not now  . Anxiety   . Arthritis    knees  . BPH (benign prostatic hypertrophy)   . Chronic kidney disease    stage 3 ckd  . COPD (chronic obstructive pulmonary disease) ([place])    has had most of his life. has not used an inhaler for years  . Coronary artery disease   . Difficult intubation    pt reports bad sore throat after surgery. pt unsure of this response  . Dyspnea   . Dysrhythmia 2016   atrial fibrillation, bradycardia  . GERD (gastroesophageal reflux disease)   . Headache   . History of hiatal hernia   . History of kidney stones 04/2018  . Hypertension   . Pneumonia   . Pre-diabetes    dr. Ginette Pitman following  . Presence of permanent cardiac pacemaker 2017  . Psoriasis 2019   elbows  . Restless leg   . STEMI involving right coronary artery ([place]) 10/07/2019  . Vertigo 1992  . Yeast infection    in groin for  50 years     Past Surgical History: Past Surgical History:  Procedure Laterality Date  . CATARACT EXTRACTION W/PHACO Right 09/25/2016   Procedure: CATARACT EXTRACTION PHACO AND INTRAOCULAR LENS PLACEMENT  (IOC);  Surgeon: Estill Cotta, MD;  Location: [facility];  Service: Ophthalmology;  Laterality: Right;  [place] 01:58 AP% 18.1 CDE 43.36 Fluid pack # [id number]  . CORONARY STENT INTERVENTION N/A 10/06/2019   Procedure: CORONARY STENT INTERVENTION;  Surgeon: Yolonda Kida, MD;  Location: [place] [facility];  Service: Cardiovascular;  Laterality: N/A;  MID RCA  . CORONARY/GRAFT ACUTE MI REVASCULARIZATION N/A 10/06/2019   Procedure: Coronary/Graft Acute MI Revascularization;  Surgeon: Yolonda Kida, MD;  Location: [place] [facility];  Service: Cardiovascular;  Laterality: N/A;  . CYSTOSCOPY N/A 09/28/2019   Procedure: CYSTOSCOPY;  Surgeon: Abbie Sons, MD;  Location: [facility];  Service: Urology;  Laterality: N/A;  . CYSTOSCOPY/URETEROSCOPY/HOLMIUM LASER/STENT PLACEMENT Right 04/13/2018   Procedure: CYSTOSCOPY/URETEROSCOPY/HOLMIUM LASER/STENT PLACEMENT;  Surgeon: Hollice Espy, MD;  Location: [facility];  Service: Urology;  Laterality: Right;  . ELECTROPHYSIOLOGIC STUDY N/A 12/22/2014   Procedure: CARDIOVERSION;  Surgeon: Teodoro Spray, MD;  Location: [facility];  Service: Cardiovascular;  Laterality: N/A;  . FINGER SURGERY Right 1950   index finger cut off half way  . HERNIA REPAIR Bilateral    inguinal repaired x 4  . INSERT / REPLACE / REMOVE PACEMAKER     12/17  . JOINT REPLACEMENT Right 2012   partial knee replacement  . LEFT HEART CATH AND CORONARY ANGIOGRAPHY N/A 10/06/2019   Procedure: LEFT HEART CATH AND CORONARY ANGIOGRAPHY;  Surgeon: Yolonda Kida, MD;  Location: [place] [facility];  Service: Cardiovascular;  Laterality: N/A;  . PACEMAKER INSERTION Left 07/17/2016   Procedure: INSERTION PACEMAKER;  Surgeon: Isaias Cowman, MD;  Location: [facility];  Service: Cardiovascular;  Laterality: Left;  . PARTIAL KNEE ARTHROPLASTY Right 2012  . TONSILLECTOMY  1950  . TOTAL KNEE ARTHROPLASTY Left 09/30/2017   Procedure: TOTAL KNEE ARTHROPLASTY;  Surgeon: Hessie Knows, MD;   Location: [facility];  Service: Orthopedics;  Laterality: Left;  . TRANSURETHRAL RESECTION OF BLADDER TUMOR N/A 09/28/2019   Procedure: TRANSURETHRAL RESECTION OF BLADDER TUMOR (TURBT);  Surgeon: Abbie Sons, MD;  Location: [facility];  Service: Urology;  Laterality: N/A;  . TRANSURETHRAL RESECTION OF PROSTATE       Family History: History reviewed. No pertinent family history.   Social History: Social History   Socioeconomic History  . Marital status: Married    Spouse name: [place]  . Number of children: 4  . Years of education: Not on file  . Highest education level: Not on file  Occupational History  . 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 Smoking status: Former Smoker    Packs/day: 2.00    Types: Cigarettes    Quit date: 12/21/1965    Years since quitting: 54.3  . Smokeless tobacco: Never Used  Vaping Use  . Vaping Use: Never used  Substance and Sexual Activity  . Alcohol use: No    Comment: last drink 1964  . Drug use: No  . Sexual activity: Not Currently  Other Topics Concern  . Not on file  Social History Narrative  . Not on file   Social Determinants of Health   Financial Resource Strain:   . Difficulty of Paying Living Expenses: Not on file  Food Insecurity:   . Worried About [employer] of  Food in the Last Year: Not on file  . Ran Out of Food in the Last Year: Not on file  Transportation Needs:   . Lack of Transportation (Medical): Not on file  . Lack of Transportation (Non-Medical): Not on file  Physical Activity:   . Days of Exercise per Week: Not on file  . Minutes of Exercise per Session: Not on file  Stress:   . Feeling of Stress : Not on file  Social Connections:   . Frequency of Communication with Friends and Family: Not on file  . Frequency of Social Gatherings with Friends and Family: Not on file  . Attends Religious Services: Not on file  . Active Member of Clubs or Organizations: Not on file  .  Attends Archivist Meetings: Not on file  . Marital Status: Not on file  Intimate Partner Violence:   . Fear of Current or Ex-Partner: Not on file  . Emotionally Abused: Not on file  . Physically Abused: Not on file  . Sexually Abused: Not on file     Review of Systems: Review of Systems  Constitutional: Negative.   HENT: Negative.   Eyes: Negative.   Respiratory: Positive for shortness of breath and wheezing. Negative for cough, hemoptysis and sputum production.   Cardiovascular: Positive for chest pain, leg swelling and PND. Negative for palpitations, orthopnea and claudication.  Gastrointestinal: Positive for abdominal pain, diarrhea, heartburn, nausea and vomiting.  Genitourinary: Negative for dysuria, flank pain, frequency, hematuria and urgency.  Musculoskeletal: Negative for back pain, falls, joint pain, myalgias and neck pain.  Skin: Negative.   Neurological: Negative.   Endo/Heme/Allergies: Negative.   Psychiatric/Behavioral: Negative.     Vital Signs: Blood pressure (!) 150/82, pulse 77, temperature 98.4 F (36.9 C), temperature source Oral, resp. rate 16, height 5\' 11"  (1.803 m), weight 101.6 kg, SpO2 96 %.  Weight trends: Filed Weights   04/05/20 1301  Weight: 101.6 kg    Physical Exam: General: NAD, cachectic  Head: +temproal wasting   Eyes: Anicteric, PERRL  Neck: Supple, trachea midline  Lungs:  Clear to auscultation  Heart: Regular rate and rhythm  Abdomen:  +distended, +ascites.   Extremities:  +peripheral edema.  Neurologic: Nonfocal, moving all four extremities  Skin: No lesions         Lab results: Basic Metabolic Panel: Recent Labs  Lab 04/05/20 1305  NA 133*  K 4.1  CL 102  CO2 20*  GLUCOSE 121*  BUN 39*  CREATININE 2.11*  CALCIUM 8.4*    Liver Function Tests: Recent Labs  Lab 04/17/2020 0158  AST 23  ALT 18  ALKPHOS 44  BILITOT 0.8  PROT 5.7*  ALBUMIN 2.7*   Recent Labs  Lab 04/08/2020 0158  LIPASE 27   No  results for input(s): AMMONIA in the last 168 hours.  CBC: Recent Labs  Lab 04/05/20 1305  WBC 6.4  HGB 12.9*  HCT 39.7  MCV 93.9  PLT 298    Cardiac Enzymes: No results for input(s): CKTOTAL, CKMB, CKMBINDEX, TROPONINI in the last 168 hours.  BNP: Invalid input(s): POCBNP  CBG: No results for input(s): GLUCAP in the last 168 hours.  Microbiology: Results for orders placed or performed during the hospital encounter of 04/29/2020  SARS Coronavirus 2 by RT PCR (hospital order, performed in [facility] hospital lab) Nasopharyngeal Nasopharyngeal Swab     Status: None   Collection Time: 04/15/2020  2:01 AM   Specimen: Nasopharyngeal Swab  Result Value Ref Range  Status   SARS Coronavirus 2 NEGATIVE NEGATIVE Final    Comment: (NOTE) SARS-CoV-2 target nucleic acids are NOT DETECTED.  The SARS-CoV-2 RNA is generally detectable in upper and lower respiratory specimens during the acute phase of infection. The lowest concentration of SARS-CoV-2 viral copies this assay can detect is 250 copies / mL. A negative result does not preclude SARS-CoV-2 infection and should not be used as the sole basis for treatment or other patient management decisions.  A negative result may occur with improper specimen collection / handling, submission of specimen other than nasopharyngeal swab, presence of viral mutation(s) within the areas targeted by this assay, and inadequate number of viral copies (<250 copies / mL). A negative result must be combined with clinical observations, patient history, and epidemiological information.  Fact Sheet for Patients:   StrictlyIdeas.no  Fact Sheet for Healthcare Providers: BankingDealers.co.za  This test is not yet approved or  cleared by the [place] FDA and has been authorized for detection and/or diagnosis of SARS-CoV-2 by FDA under an Emergency Use Authorization (EUA).  This EUA will remain in effect  (meaning this test can be used) for the duration of the COVID-19 declaration under Section 564(b)(1) of the Act, 21 U.S.C. section 360bbb-3(b)(1), unless the authorization is terminated or revoked sooner.  Performed at [facility], [address] [place], [place] [address]     Coagulation Studies: No results for input(s): LABPROT, INR in the last 72 hours.  Urinalysis: Recent Labs    04/05/20 1303  COLORURINE AMBER*  LABSPEC 1.023  PHURINE 5.0  GLUCOSEU NEGATIVE  HGBUR NEGATIVE  BILIRUBINUR NEGATIVE  KETONESUR NEGATIVE  PROTEINUR 30*  NITRITE POSITIVE*  LEUKOCYTESUR NEGATIVE      Imaging: CT ABDOMEN PELVIS WO CONTRAST  Result Date: 04/26/2020 CLINICAL DATA:  Abdominal distension and nonlocalized abdominal pain. EXAM: CT ABDOMEN AND PELVIS WITHOUT CONTRAST TECHNIQUE: Multidetector CT imaging of the abdomen and pelvis was performed following the standard protocol without IV contrast. COMPARISON:  09/06/2019 and abdominal CT report from [facility] 01/14/2020 FINDINGS: Lower chest: Generous heart size. Dual-chamber pacer leads. Small pleural effusions on the left more than right with calcified pleural plaques. Hepatobiliary: Small appearance of the liver with some surface lobulation and caudate/fissure enlargement.Mild high-density at the dependent gallbladder without calcified stone or acute inflammatory finding Pancreas: Generalized fatty atrophy. Spleen: Unremarkable. Adrenals/Urinary Tract: Negative adrenals. Cystic densities on both sides of the renal cortex. Anterior and right lateral asymmetric bladder wall thickening. Although imperceptible on axial slices there is an intact posterior bladder wall by reformats Stomach/Bowel: No obstruction. No visible bowel inflammation. Proctitis by recent sigmoidoscopy, unremarkable by CT Vascular/Lymphatic: Multifocal atheromatous calcification. No mass or adenopathy. Reproductive:Mildly enlarged prostate. Other: Large volume ascites.  There is reticulation of the greater omentum without nodularity or discrete masslike finding. Bilateral inguinal hernia repair. Musculoskeletal: No acute abnormalities. Lumbar spine degeneration, focally advanced at L4-5. IMPRESSION: 1. Large volume ascites which has seemingly increased from a outside CT report June 2021. There could be underlying liver cirrhosis. At sampling, consider including cytology given history of bladder cancer. 2. Small pleural effusions. 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 Electronically Signed   By: Monte Fantasia M.D.   On: 04/05/2020 05:42   ECHOCARDIOGRAM COMPLETE  Result Date: 04/12/2020    ECHOCARDIOGRAM REPORT   Patient Name:   CARLSON BELLAND Date of Exam: 04/05/2020 Medical Rec #:  [id number]         Height:  71.0 in Accession #:    [id number]        Weight:       224.0 lb Date of Birth:  08-Jul-1936        BSA:          2.213 m Patient Age:    89 years          BP:           125/78 mmHg Patient Gender: M                 HR:           64 bpm. Exam Location:  [facility] Procedure: 2D Echo, Cardiac Doppler and Color Doppler Indications:     CHF- acute diastolic 322.02  History:         Patient has no prior history of Echocardiogram examinations.                  COPD; Risk Factors:Hypertension. STEMI.  Sonographer:     Sherrie Sport RDCS (AE) Referring Phys:  [id number] Royce Macadamia AGBATA Diagnosing Phys: Isaias Cowman MD IMPRESSIONS  1. Left ventricular ejection fraction, by estimation, is 50 to 55%. The left ventricle has low normal function. The left ventricle has no regional wall motion abnormalities. Left ventricular diastolic parameters are indeterminate.  2. Right ventricular systolic function is normal. The right ventricular size is normal. There is mildly elevated pulmonary artery systolic pressure.  3. Left atrial size was mild to moderately dilated.  4. The mitral valve is normal in structure. Mild mitral valve regurgitation.  No evidence of mitral stenosis.  5. The aortic valve is normal in structure. Aortic valve regurgitation is mild. No aortic stenosis is present.  6. The inferior vena cava is normal in size with greater than 50% respiratory variability, suggesting right atrial pressure of 3 mmHg. FINDINGS  Left Ventricle: Left ventricular ejection fraction, by estimation, is 50 to 55%. The left ventricle has low normal function. The left ventricle has no regional wall motion abnormalities. The left ventricular internal cavity size was normal in size. There is no left ventricular hypertrophy. Left ventricular diastolic parameters are indeterminate. Right Ventricle: The right ventricular size is normal. No increase in right ventricular wall thickness. Right ventricular systolic function is normal. There is mildly elevated pulmonary artery systolic pressure. The tricuspid regurgitant velocity is 2.93  m/s, and with an assumed right atrial pressure of 10 mmHg, the estimated right ventricular systolic pressure is 23.7 mmHg. Left Atrium: Left atrial size was mild to moderately dilated. Right Atrium: Right atrial size was normal in size. Pericardium: There is no evidence of pericardial effusion. Mitral Valve: The mitral valve is normal in structure. Normal mobility of the mitral valve leaflets. Mild mitral valve regurgitation. No evidence of mitral valve stenosis. Tricuspid Valve: The tricuspid valve is normal in structure. Tricuspid valve regurgitation is mild . No evidence of tricuspid stenosis. Aortic Valve: The aortic valve is normal in structure. Aortic valve regurgitation is mild. No aortic stenosis is present. Aortic valve mean gradient measures 6.5 mmHg. Aortic valve peak gradient measures 11.0 mmHg. Aortic valve area, by VTI measures 2.01  cm. Pulmonic Valve: The pulmonic valve was normal in structure. Pulmonic valve regurgitation is not visualized. No evidence of pulmonic stenosis. Aorta: The aortic root is normal in size and  structure. Venous: The inferior vena cava is normal in size with greater than 50% respiratory variability, suggesting right atrial pressure of 3 mmHg. IAS/Shunts: No atrial level shunt detected  by color flow Doppler. Additional Comments: A pacer wire is visualized.  LEFT VENTRICLE PLAX 2D LVIDd:         3.89 cm LVIDs:         2.59 cm LV PW:         2.02 cm LV IVS:        1.67 cm LVOT diam:     2.00 cm LV SV:         62 LV SV Index:   28 LVOT Area:     3.14 cm  RIGHT VENTRICLE RV Basal diam:  4.87 cm RV S prime:     13.70 cm/s TAPSE (M-mode): 3.2 cm LEFT ATRIUM             Index       RIGHT ATRIUM           Index LA diam:        6.10 cm 2.76 cm/m  RA Area:     18.20 cm LA Vol (A2C):   77.5 ml 35.03 ml/m RA Volume:   41.90 ml  18.94 ml/m LA Vol (A4C):   99.8 ml 45.11 ml/m LA Biplane Vol: 88.9 ml 40.18 ml/m  AORTIC VALVE                    PULMONIC VALVE AV Area (Vmax):    1.89 cm     PV Vmax:        0.60 m/s AV Area (Vmean):   1.93 cm     PV Peak grad:   1.4 mmHg AV Area (VTI):     2.01 cm     RVOT Peak grad: 3 mmHg AV Vmax:           165.50 cm/s AV Vmean:          116.000 cm/s AV VTI:            0.308 m AV Peak Grad:      11.0 mmHg AV Mean Grad:      6.5 mmHg LVOT Vmax:         99.80 cm/s LVOT Vmean:        71.100 cm/s LVOT VTI:          0.197 m LVOT/AV VTI ratio: 0.64  AORTA Ao Root diam: 3.60 cm MITRAL VALVE               TRICUSPID VALVE MV Area (PHT): 3.06 cm    TR Peak grad:   34.3 mmHg MV Decel Time: 248 msec    TR Vmax:        293.00 cm/s MV E velocity: 82.00 cm/s                            SHUNTS                            Systemic VTI:  0.20 m                            Systemic Diam: 2.00 cm Isaias Cowman MD Electronically signed by Isaias Cowman MD Signature Date/Time: 04/19/2020/1:33:29 PM    Final      Assessment & Plan: Mr. ANTONIN MEININGER is a 84 y.o.white  male with hypertension, coronary artery disease status post CABG, COPD, BPH, atrial fibrillation, bladder cancer who was  admitted to  [place] on 04/05/2020 for AKI (acute kidney injury) ([place]) [N17.9] UTI (urinary tract infection) [N39.0]   1. Acute renal failure on chronic kidney disease stage IIIA. Baseline creatinine of 1.36, GFR of 48 on 10/07/19.  Acute renal failure most likely secondary to acute hepatorenal syndrome versus prerenal azotemia.  - IV furosemide - PO spironolactone - holding losartan  2. Hypertension: - hold amlodipine. Continue metoprolol for now.  - volume driven  3. Urinary tract infection  - empiric ceftriaxone  4. Ascites and anasarca: recommend large volume and diagnostic paracentesis with IV albumin.  Concerning for spontaneous bacterial peritonitis.   5. Hepatic cirrhosis - recommend alpha feto protein tumor marker    LOS: 0 Emmakate Hypes 9/2/20213:14 PM

## 2020-04-06 NOTE — ED Notes (Signed)
Pt assisted to toilet in room. Pt had loose BM after drinking contrast. Pt cleaned up.

## 2020-04-07 ENCOUNTER — Inpatient Hospital Stay: Payer: Medicare HMO

## 2020-04-07 DIAGNOSIS — R338 Other retention of urine: Secondary | ICD-10-CM

## 2020-04-07 DIAGNOSIS — N179 Acute kidney failure, unspecified: Secondary | ICD-10-CM

## 2020-04-07 DIAGNOSIS — N390 Urinary tract infection, site not specified: Secondary | ICD-10-CM

## 2020-04-07 DIAGNOSIS — I4891 Unspecified atrial fibrillation: Secondary | ICD-10-CM

## 2020-04-07 DIAGNOSIS — R188 Other ascites: Secondary | ICD-10-CM

## 2020-04-07 DIAGNOSIS — I63411 Cerebral infarction due to embolism of right middle cerebral artery: Secondary | ICD-10-CM

## 2020-04-07 DIAGNOSIS — I2510 Atherosclerotic heart disease of native coronary artery without angina pectoris: Secondary | ICD-10-CM

## 2020-04-07 DIAGNOSIS — R601 Generalized edema: Secondary | ICD-10-CM

## 2020-04-07 LAB — CBC
HCT: 38.3 % — ABNORMAL LOW (ref 39.0–52.0)
Hemoglobin: 12.6 g/dL — ABNORMAL LOW (ref 13.0–17.0)
MCH: 30.6 pg (ref 26.0–34.0)
MCHC: 32.9 g/dL (ref 30.0–36.0)
MCV: 93 fL (ref 80.0–100.0)
Platelets: 229 10*3/uL (ref 150–400)
RBC: 4.12 MIL/uL — ABNORMAL LOW (ref 4.22–5.81)
RDW: 15.9 % — ABNORMAL HIGH (ref 11.5–15.5)
WBC: 6.9 10*3/uL (ref 4.0–10.5)
nRBC: 0 % (ref 0.0–0.2)

## 2020-04-07 LAB — BASIC METABOLIC PANEL
Anion gap: 10 (ref 5–15)
BUN: 37 mg/dL — ABNORMAL HIGH (ref 8–23)
CO2: 21 mmol/L — ABNORMAL LOW (ref 22–32)
Calcium: 8.5 mg/dL — ABNORMAL LOW (ref 8.9–10.3)
Chloride: 105 mmol/L (ref 98–111)
Creatinine, Ser: 1.89 mg/dL — ABNORMAL HIGH (ref 0.61–1.24)
GFR calc Af Amer: 38 mL/min — ABNORMAL LOW (ref >60)
GFR calc non Af Amer: 33 mL/min — ABNORMAL LOW (ref >60)
Glucose, Bld: 95 mg/dL (ref 70–99)
Potassium: 4 mmol/L (ref 3.5–5.1)
Sodium: 136 mmol/L (ref 135–145)

## 2020-04-07 MED ORDER — TRAZODONE HCL 50 MG PO TABS
50.0000 mg | ORAL_TABLET | Freq: Every evening | ORAL | Status: DC | PRN
  Administered 2020-04-07 – 2020-04-08 (×2): 50 mg via ORAL
  Filled 2020-04-07 (×2): qty 1

## 2020-04-07 MED ORDER — CHLORHEXIDINE GLUCONATE CLOTH 2 % EX PADS
6.0000 | MEDICATED_PAD | Freq: Every day | CUTANEOUS | Status: DC
  Administered 2020-04-07 – 2020-04-09 (×3): 6 via TOPICAL

## 2020-04-07 MED ORDER — MEMANTINE HCL 5 MG PO TABS
10.0000 mg | ORAL_TABLET | Freq: Two times a day (BID) | ORAL | Status: DC
  Administered 2020-04-07 – 2020-04-08 (×3): 10 mg via ORAL
  Filled 2020-04-07 (×4): qty 2

## 2020-04-07 MED ORDER — CLOPIDOGREL BISULFATE 75 MG PO TABS
75.0000 mg | ORAL_TABLET | Freq: Every day | ORAL | Status: DC
  Administered 2020-04-07 – 2020-04-08 (×2): 75 mg via ORAL
  Filled 2020-04-07 (×3): qty 1

## 2020-04-07 NOTE — ED Notes (Signed)
PT returned from CT, no changes in neuro exam., pt continues to have no movement to left arm.  Dr. Kurtis Bushman aware.

## 2020-04-07 NOTE — Consult Note (Addendum)
Requesting Physician: Dr. Kurtis Bushman    Chief Complaint: Left arm weakness  History obtained from: Patient and Chart     HPI:                                                                                                                                       Joseph Houston is a 84 y.o. male with past medical history significant for COPD, atrial fibrillation on anticoagulation, hypertension, coronary artery disease, hepatic cirrhosis with admitted for increasing shortness of breath on 9/2. [name]  Noted to have acute renal failure and urinary tract infection as well as ascitics with anasarca. Eliquis was held yesterday in anticipation for paracentesis.   Went to sleep around 11 PM last night and on waking up this morning is unable to move his left arm.  Sensation intact and able to move his left lower extremity against gravity, although apparently has chronic bilateral lower extremity weakness.  Stat CT head was obtained which rule out hemorrhage.  Patient not candidate for TPA he is less than 48hrs from last dose of Eliquis as well as outside TPA window.  Clinically does not appear LVO due to absence of neglect both visual and sensory.  CTA not performed due to acute kidney injury.   Date last known well: 9.2.21 Time last known well: 11 pm tPA Given: no, outside window NIHSS: 9 Baseline MRS 3   Past Medical History:  Diagnosis Date  . Anemia    was treated in the past, not now  . Anxiety   . Arthritis    knees  . BPH (benign prostatic hypertrophy)   . Chronic kidney disease    stage 3 ckd  . COPD (chronic obstructive pulmonary disease) ([place])    has had most of his life. has not used an inhaler for years  . Coronary artery disease   . Difficult intubation    pt reports bad sore throat after surgery. pt unsure of this response  . Dyspnea   . Dysrhythmia 2016   atrial fibrillation, bradycardia  . GERD (gastroesophageal reflux disease)   . Headache   . History of hiatal hernia   .  History of kidney stones 04/2018  . Hypertension   . Pneumonia   . Pre-diabetes    dr. Ginette Pitman following  . Presence of permanent cardiac pacemaker 2017  . Psoriasis 2019   elbows  . Restless leg   . STEMI involving right coronary artery ([place]) 10/07/2019  . Vertigo 1992  . Yeast infection    in groin for 50 years    Past Surgical History:  Procedure Laterality Date  . CATARACT EXTRACTION W/PHACO Right 09/25/2016   Procedure: CATARACT EXTRACTION PHACO AND INTRAOCULAR LENS PLACEMENT (IOC);  Surgeon: Estill Cotta, MD;  Location: [facility];  Service: Ophthalmology;  Laterality: Right;  [place] 01:58 AP% 18.1 CDE 43.36 Fluid pack # [id number]  . CORONARY STENT INTERVENTION  N/A 10/06/2019   Procedure: CORONARY STENT INTERVENTION;  Surgeon: Yolonda Kida, MD;  Location: [place] [facility];  Service: Cardiovascular;  Laterality: N/A;  MID RCA  . CORONARY/GRAFT ACUTE MI REVASCULARIZATION N/A 10/06/2019   Procedure: Coronary/Graft Acute MI Revascularization;  Surgeon: Yolonda Kida, MD;  Location: [place] [facility];  Service: Cardiovascular;  Laterality: N/A;  . CYSTOSCOPY N/A 09/28/2019   Procedure: CYSTOSCOPY;  Surgeon: Abbie Sons, MD;  Location: [facility];  Service: Urology;  Laterality: N/A;  . CYSTOSCOPY/URETEROSCOPY/HOLMIUM LASER/STENT PLACEMENT Right 04/13/2018   Procedure: CYSTOSCOPY/URETEROSCOPY/HOLMIUM LASER/STENT PLACEMENT;  Surgeon: Hollice Espy, MD;  Location: [facility];  Service: Urology;  Laterality: Right;  . ELECTROPHYSIOLOGIC STUDY N/A 12/22/2014   Procedure: CARDIOVERSION;  Surgeon: Teodoro Spray, MD;  Location: [facility];  Service: Cardiovascular;  Laterality: N/A;  . FINGER SURGERY Right 1950   index finger cut off half way  . HERNIA REPAIR Bilateral    inguinal repaired x 4  . INSERT / REPLACE / REMOVE PACEMAKER     12/17  . JOINT REPLACEMENT Right 2012   partial knee replacement  . LEFT HEART CATH AND CORONARY ANGIOGRAPHY N/A 10/06/2019   Procedure: LEFT  HEART CATH AND CORONARY ANGIOGRAPHY;  Surgeon: Yolonda Kida, MD;  Location: [place] [facility];  Service: Cardiovascular;  Laterality: N/A;  . PACEMAKER INSERTION Left 07/17/2016   Procedure: INSERTION PACEMAKER;  Surgeon: Isaias Cowman, MD;  Location: [facility];  Service: Cardiovascular;  Laterality: Left;  . PARTIAL KNEE ARTHROPLASTY Right 2012  . TONSILLECTOMY  1950  . TOTAL KNEE ARTHROPLASTY Left 09/30/2017   Procedure: TOTAL KNEE ARTHROPLASTY;  Surgeon: Hessie Knows, MD;  Location: [facility];  Service: Orthopedics;  Laterality: Left;  . TRANSURETHRAL RESECTION OF BLADDER TUMOR N/A 09/28/2019   Procedure: TRANSURETHRAL RESECTION OF BLADDER TUMOR (TURBT);  Surgeon: Abbie Sons, MD;  Location: [facility];  Service: Urology;  Laterality: N/A;  . TRANSURETHRAL RESECTION OF PROSTATE      History reviewed. No pertinent family history. Social History:  reports that he quit smoking about 54 years ago. His smoking use included cigarettes. He smoked 2.00 packs per day. He has never used smokeless tobacco. He reports that he does not drink alcohol and does not use drugs.  Allergies:  Allergies  Allergen Reactions  . Mold Extract [Trichophyton] Swelling and Other (See Comments)    Tightness in throat  . Dog Epithelium Swelling and Other (See Comments)    Also, cats too He is near the animals and does okay but allergy testing indicates that he   Is allergic to animals    . Other Swelling    Allergic to a variety of trees that surround his home. Allergy testing indicated that  this is an allergy. Took allergy shots for many years. Stopped 1 yr ago  . Tree Extract Swelling    Allergic to a variety of trees that surround his home. Allergy testing indicated that  this is an allergy.  Took allergy shots for many years. Stopped 1 yr ago  . Ciprofloxacin     Hallucinations   . Codeine Nausea And Vomiting  . Sulfa Antibiotics Nausea And Vomiting    Medications:  I reviewed home medications   ROS:                                                                                                                                     14 systems reviewed and negative except above    Examination:                                                                                                      General: Appears well-developed  Psych: Affect appropriate to situation Eyes: No scleral injection HENT: No OP obstrucion Head: Normocephalic.  Cardiovascular: Irregular rate and rhythm Respiratory: Effort normal and breath sounds normal to anterior ascultation GI: Soft.  No distension. There is no tenderness.  Skin: WDI    Neurological Examination Mental Status: Alert, oriented, thought content appropriate.  Speech fluent without evidence of aphasia. Able to follow 3 step commands without difficulty. Cranial Nerves: II: Visual fields grossly normal,  III,IV, VI: ptosis not present, extra-ocular motions intact bilaterally, pupils equal, round, reactive to light and accommodation V,VII: left facial droop,  facial light touch sensation normal bilaterally VIII: hearing normal bilaterally IX,X: uvula rises symmetrically XI: bilateral shoulder shrug XII: midline tongue extension Motor: Right : Upper extremity   5/5    Left:     Upper extremity   0/5  Lower extremity   3+/5     Lower extremity   2+/5 Tone and bulk:normal tone throughout; no atrophy noted Sensory: Pinprick and light touch intact throughout, bilaterally. No Neglect   Plantars: Right: downgoing   Left: downgoing Cerebellar: normal finger-to-nose on right UE      Lab Results: Basic Metabolic Panel: Recent Labs  Lab 04/05/20 1305 04/07/20 0646  NA 133* 136  K 4.1 4.0  CL 102 105  CO2 20* 21*  GLUCOSE 121* 95  BUN 39* 37*  CREATININE 2.11* 1.89*  CALCIUM 8.4* 8.5*    CBC: Recent  Labs  Lab 04/05/20 1305 04/07/20 0646  WBC 6.4 6.9  HGB 12.9* 12.6*  HCT 39.7 38.3*  MCV 93.9 93.0  PLT 298 229    Coagulation Studies: No results for input(s): LABPROT, INR in the last 72 hours.  Imaging: CT ABDOMEN PELVIS WO CONTRAST  Result Date: 05/02/2020 CLINICAL DATA:  Abdominal distension and nonlocalized abdominal pain. EXAM: CT ABDOMEN AND PELVIS WITHOUT CONTRAST TECHNIQUE: Multidetector CT imaging of the abdomen and pelvis was performed following the standard protocol without IV contrast. COMPARISON:  09/06/2019 and abdominal CT report from [facility] 01/14/2020  FINDINGS: Lower chest: Generous heart size. Dual-chamber pacer leads. Small pleural effusions on the left more than right with calcified pleural plaques. Hepatobiliary: Small appearance of the liver with some surface lobulation and caudate/fissure enlargement.Mild high-density at the dependent gallbladder without calcified stone or acute inflammatory finding Pancreas: Generalized fatty atrophy. Spleen: Unremarkable. Adrenals/Urinary Tract: Negative adrenals. Cystic densities on both sides of the renal cortex. Anterior and right lateral asymmetric bladder wall thickening. Although imperceptible on axial slices there is an intact posterior bladder wall by reformats Stomach/Bowel: No obstruction. No visible bowel inflammation. Proctitis by recent sigmoidoscopy, unremarkable by CT Vascular/Lymphatic: Multifocal atheromatous calcification. No mass or adenopathy. Reproductive:Mildly enlarged prostate. Other: Large volume ascites. There is reticulation of the greater omentum without nodularity or discrete masslike finding. Bilateral inguinal hernia repair. Musculoskeletal: No acute abnormalities. Lumbar spine degeneration, focally advanced at L4-5. IMPRESSION: 1. Large volume ascites which has seemingly increased from a outside CT report June 2021. There could be underlying liver cirrhosis. At sampling, consider including cytology given history  of bladder cancer. 2. Small pleural effusions. 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 Electronically Signed   By: Monte Fantasia M.D.   On: 04/17/2020 05:42   CT HEAD WO CONTRAST  Result Date: 04/07/2020 CLINICAL DATA:  Neuro deficit, acute, stroke suspected; new left arm weakness. EXAM: CT HEAD WITHOUT CONTRAST TECHNIQUE: Contiguous axial images were obtained from the base of the skull through the vertex without intravenous contrast. COMPARISON:  Prior head CT examinations 09/20/2015 and earlier. FINDINGS: Brain: Stable, moderate generalized parenchymal atrophy. Redemonstrated chronic small-vessel infarct within the posterior left centrum semiovale/corona radiata (series 4, image 39). Progressive background moderate ill-defined hypoattenuation within the cerebral white matter is nonspecific, but consistent with chronic small vessel ischemic disease. No acute demarcated cortical infarct is identified. A small chronic cortically based infarct is questioned within the right occipital lobe. There is no acute intracranial hemorrhage. No extra-axial fluid collection. No evidence of intracranial mass. No midline shift. Vascular: No hyperdense vessel.  Atherosclerotic calcifications Skull: Normal. Negative for fracture or focal lesion. Sinuses/Orbits: Visualized orbits show no acute finding. Mild ethmoid sinus mucosal thickening. No significant mastoid effusion. IMPRESSION: No CT evidence of acute intracranial abnormality. A small chronic cortically based infarct is questioned within the right occipital lobe. Progressive moderate chronic small vessel ischemic disease. Stable, moderate generalized parenchymal atrophy. Mild ethmoid sinus mucosal thickening. Electronically Signed   By: Kellie Simmering DO   On: 04/07/2020 09:23   US Paracentesis  Result Date: 04/07/2020 EXAM: ULTRASOUND GUIDED  PARACENTESIS MEDICATIONS: None. COMPLICATIONS: None immediate. PROCEDURE: Informed  written consent was obtained from the patient after a discussion of the risks, benefits and alternatives to treatment. A timeout was performed prior to the initiation of the procedure. Initial ultrasound scanning demonstrates a large amount of ascites within the right lower abdominal quadrant. The right lower abdomen was prepped and draped in the usual sterile fashion. 1% lidocaine was used for local anesthesia. Following this, a Yueh catheter was introduced. An ultrasound image was saved for documentation purposes. The paracentesis was performed. The catheter was removed and a dressing was applied. The patient tolerated the procedure well without immediate post procedural complication. Patient received post-procedure intravenous albumin; see nursing notes for details. FINDINGS: A total of approximately 5,270 mL of cloudy fluid was removed. Samples were sent to the laboratory as requested by the clinical team. IMPRESSION: Successful ultrasound-guided paracentesis yielding 5270 mL of peritoneal fluid. Electronically Signed   By:  [place]   On: 04/07/2020 05:10   ECHOCARDIOGRAM COMPLETE  Result Date: 04/29/2020    ECHOCARDIOGRAM REPORT   Patient Name:   Joseph Houston Date of Exam: 04/20/2020 Medical Rec #:  [id number]         Height:       71.0 in Accession #:    [id number]        Weight:       224.0 lb Date of Birth:  1936/06/07        BSA:          2.213 m Patient Age:    24 years          BP:           125/78 mmHg Patient Gender: M                 HR:           64 bpm. Exam Location:  [facility] Procedure: 2D Echo, Cardiac Doppler and Color Doppler Indications:     CHF- acute diastolic 941.74  History:         Patient has no prior history of Echocardiogram examinations.                  COPD; Risk Factors:Hypertension. STEMI.  Sonographer:     Sherrie Sport RDCS (AE) Referring Phys:  [id number] Royce Macadamia AGBATA Diagnosing Phys: Isaias Cowman MD IMPRESSIONS  1. Left ventricular ejection fraction, by estimation,  is 50 to 55%. The left ventricle has low normal function. The left ventricle has no regional wall motion abnormalities. Left ventricular diastolic parameters are indeterminate.  2. Right ventricular systolic function is normal. The right ventricular size is normal. There is mildly elevated pulmonary artery systolic pressure.  3. Left atrial size was mild to moderately dilated.  4. The mitral valve is normal in structure. Mild mitral valve regurgitation. No evidence of mitral stenosis.  5. The aortic valve is normal in structure. Aortic valve regurgitation is mild. No aortic stenosis is present.  6. The inferior vena cava is normal in size with greater than 50% respiratory variability, suggesting right atrial pressure of 3 mmHg. FINDINGS  Left Ventricle: Left ventricular ejection fraction, by estimation, is 50 to 55%. The left ventricle has low normal function. The left ventricle has no regional wall motion abnormalities. The left ventricular internal cavity size was normal in size. There is no left ventricular hypertrophy. Left ventricular diastolic parameters are indeterminate. Right Ventricle: The right ventricular size is normal. No increase in right ventricular wall thickness. Right ventricular systolic function is normal. There is mildly elevated pulmonary artery systolic pressure. The tricuspid regurgitant velocity is 2.93  m/s, and with an assumed right atrial pressure of 10 mmHg, the estimated right ventricular systolic pressure is 18.5 mmHg. Left Atrium: Left atrial size was mild to moderately dilated. Right Atrium: Right atrial size was normal in size. Pericardium: There is no evidence of pericardial effusion. Mitral Valve: The mitral valve is normal in structure. Normal mobility of the mitral valve leaflets. Mild mitral valve regurgitation. No evidence of mitral valve stenosis. Tricuspid Valve: The tricuspid valve is normal in structure. Tricuspid valve regurgitation is mild . No evidence of tricuspid  stenosis. Aortic Valve: The aortic valve is normal in structure. Aortic valve regurgitation is mild. No aortic stenosis is present. Aortic valve mean gradient measures 6.5 mmHg. Aortic valve peak gradient measures 11.0 mmHg. Aortic valve area, by VTI measures 2.01  cm. Pulmonic Valve: The pulmonic  valve was normal in structure. Pulmonic valve regurgitation is not visualized. No evidence of pulmonic stenosis. Aorta: The aortic root is normal in size and structure. Venous: The inferior vena cava is normal in size with greater than 50% respiratory variability, suggesting right atrial pressure of 3 mmHg. IAS/Shunts: No atrial level shunt detected by color flow Doppler. Additional Comments: A pacer wire is visualized.  LEFT VENTRICLE PLAX 2D LVIDd:         3.89 cm LVIDs:         2.59 cm LV PW:         2.02 cm LV IVS:        1.67 cm LVOT diam:     2.00 cm LV SV:         62 LV SV Index:   28 LVOT Area:     3.14 cm  RIGHT VENTRICLE RV Basal diam:  4.87 cm RV S prime:     13.70 cm/s TAPSE (M-mode): 3.2 cm LEFT ATRIUM             Index       RIGHT ATRIUM           Index LA diam:        6.10 cm 2.76 cm/m  RA Area:     18.20 cm LA Vol (A2C):   77.5 ml 35.03 ml/m RA Volume:   41.90 ml  18.94 ml/m LA Vol (A4C):   99.8 ml 45.11 ml/m LA Biplane Vol: 88.9 ml 40.18 ml/m  AORTIC VALVE                    PULMONIC VALVE AV Area (Vmax):    1.89 cm     PV Vmax:        0.60 m/s AV Area (Vmean):   1.93 cm     PV Peak grad:   1.4 mmHg AV Area (VTI):     2.01 cm     RVOT Peak grad: 3 mmHg AV Vmax:           165.50 cm/s AV Vmean:          116.000 cm/s AV VTI:            0.308 m AV Peak Grad:      11.0 mmHg AV Mean Grad:      6.5 mmHg LVOT Vmax:         99.80 cm/s LVOT Vmean:        71.100 cm/s LVOT VTI:          0.197 m LVOT/AV VTI ratio: 0.64  AORTA Ao Root diam: 3.60 cm MITRAL VALVE               TRICUSPID VALVE MV Area (PHT): 3.06 cm    TR Peak grad:   34.3 mmHg MV Decel Time: 248 msec    TR Vmax:        293.00 cm/s MV E  velocity: 82.00 cm/s                            SHUNTS                            Systemic VTI:  0.20 m                            Systemic Diam: 2.00  cm Isaias Cowman MD Electronically signed by Isaias Cowman MD Signature Date/Time: 04/17/2020/1:33:29 PM    Final      ASSESSMENT AND PLAN  84 y.o. male with past medical history significant for COPD,bladder cancer,  atrial fibrillation on anticoagulation, hypertension, coronary artery disease, CKD,  hepatic cirrhosis with ascites and anasarca admitted with Acute renal failure and UTI  With new onset left arm paralysis concerning for acute stroke. Stat CT head was obtained which rule out hemorrhage.  Patient not candidate for TPA as he is on anticoagulation with Eliquis, last dose within 48 hours as well as outside TPA window.  Clinically does not appear LVO due to absence of neglect both visual and sensory, supect a distal occlusion.   CTA not performed due to acute kidney injury.  Etiology of stroke likely cardioembolic due to Afib - with recent discontinuation of AC due to ascitis requiring peritoneal dialysis   Acute Ischemic Stroke   Recommendations # MRI of the brain without contrast and MRA Head and neck ( may not be able to be performed due to [facility], then repeat CT head in 24 hours and Carotid doppler) #Transthoracic Echo : completed yesterday- EF 50-55%, no RWMA, dilated LA, no thrombus.  # blood cultures ordered  # Hold Eliquis due to risk of hemorrhagic conversion- until MRI or repeat CT to determine size of stroke. OK to continue plavix.  #Start or continue Atorvastatin 40 mg/other high intensity statin # BP goal: permissive HTN upto 180/110 mm Hg # HBAIC and Lipid profile # Telemetry monitoring # Frequent neuro checks # stroke swallow screen  Please page stroke NP  Or  PA  Or MD from 8am -4 pm  as this patient from this time will be  followed by the stroke.   You can look them up on [email or web address]  Password  [email or web address]    CRITICAL CARE Performed by: Lanice Schwab Charlane Westry   Total critical care time: 50  minutes  Critical care time was exclusive of separately billable procedures and treating other patients.  Critical care was necessary to treat or prevent imminent or life-threatening deterioration.  Critical care was time spent personally by me on the following activities: development of treatment plan with patient and/or surrogate as well as nursing, discussions with consultants, evaluation of patient's response to treatment, examination of patient, obtaining history from patient or surrogate, ordering and performing treatments and interventions, ordering and review of laboratory studies, ordering and review of radiographic studies, pulse oximetry and re-evaluation of patient's condition.      Maddyn Lieurance [facility] Pager Number [phone]

## 2020-04-07 NOTE — ED Notes (Signed)
Neuro at bedside to assess pt.

## 2020-04-07 NOTE — ED Notes (Signed)
Called to room by pt to state that his left arm will not move.  Pt has not strength or effort against gravity with left arm.  Pt reports that this is new.  Other than left arm flaccidity, no other neurological changes noted.  Pt A&O x 4.  PT states noted the change when he awoke.  Dr. Kurtis Bushman notified, new orders received.

## 2020-04-07 NOTE — Progress Notes (Signed)
PROGRESS NOTE    Joseph Houston  RJJ:[id number] DOB: 08-31-1935 DOA: 04/05/2020 PCP: Tracie Harrier, MD    Brief Narrative:  Joseph Houston is a 84 y.o. male with medical history significant for high-grade urothelial carcinoma of the bladder status post chemo and radiation therapy, history of A. fib on anticoagulation, history of coronary artery disease, hypertension, chronic kidney disease stage III who presents to the emergency room for evaluation of generalized weakness which has progressively worsened over the last couple of weeks and now patient has to use a cane for ambulation to reduce his risk for falls because he is unsteady on his feet.  He complains of abdominal pain which is diffuse but mostly in the periumbilical area associated with nausea and vomiting but denies having any changes in his bowel habits.  He has shortness of breath with exertion but denies having any chest pain, no cough, no fever, no chills.  He also stated that he has not voided in the last 12 hours and a bladder scan revealed 900 cc of urine. Labs revealed sodium 133, potassium 4.1, chloride 102, bicarb 20, BUN 29, creatinine 2.1 compared to baseline of 1.36, AST 23, ALT 18, lactic acid 1.5, hemoglobin 12.9, hematocrit 39.7, platelet count 298. CT scan of abdomen and pelvis showed large volume ascites which has seemingly increased from an outside CT report June 2021. There could be underlying liver cirrhosis.     Consultants:   Neurology, nephrology  Procedures: CT, status post paracentesis  Antimicrobials:   Ceftriaxone   Subjective: Was called this a.m. by nursing that patient had inability to lift his left upper extremity.  No other neurologic deficits were noted.  Patient seen and examined.  Wife at bedside.  She also states this is new.  Patient unable to lift his left arm at all or move it.  Unable to squeeze my hand.  But is able to move his left leg.  Objective: Vitals:   04/07/20 0700 04/07/20  1345 04/07/20 1407 04/07/20 1435  BP: (!) 153/63  123/60 121/71  Pulse: 90 86 97 93  Resp: 14 15 17 18   Temp: (!) 97.4 F (36.3 C)   98.1 F (36.7 C)  TempSrc: Oral   Oral  SpO2: 95% 94% 92% 95%  Weight:    95.5 kg  Height:    5\' 11"  (1.803 m)    Intake/Output Summary (Last 24 hours) at 04/07/2020 1807 Last data filed at 04/07/2020 1700 Gross per 24 hour  Intake 2450 ml  Output 1300 ml  Net 1150 ml   Filed Weights   04/05/20 1301 04/07/20 1435  Weight: 101.6 kg 95.5 kg    Examination:  General exam: Appears calm and comfortable  Respiratory system: Clear to auscultation. Respiratory effort normal. Cardiovascular system: S1 & S2 heard, RRR. No JVD, murmurs, rubs, gallops or clicks. Gastrointestinal system: Abdomen is mild distention, nt, +bs Central nervous system: Alert and oriented. LUE flaccid. LLE able to move but weak b/l LE. Very mild ? Lt facial droop?[name]  Extremities:+b/l LE pitting edema Skin: No rashes, lesions or ulcers Psychiatry: Judgement and insight appear normal. Mood & affect appropriate.     Data Reviewed: I have personally reviewed following labs and imaging studies  CBC: Recent Labs  Lab 04/05/20 1305 04/07/20 0646  WBC 6.4 6.9  HGB 12.9* 12.6*  HCT 39.7 38.3*  MCV 93.9 93.0  PLT 298 016   Basic Metabolic Panel: Recent Labs  Lab 04/05/20 1305 04/07/20 0646  NA 133*  136  K 4.1 4.0  CL 102 105  CO2 20* 21*  GLUCOSE 121* 95  BUN 39* 37*  CREATININE 2.11* 1.89*  CALCIUM 8.4* 8.5*   GFR: Estimated Creatinine Clearance: 36.2 mL/min (A) (by C-G formula based on SCr of 1.89 mg/dL (H)). Liver Function Tests: Recent Labs  Lab 04/16/2020 0158  AST 23  ALT 18  ALKPHOS 44  BILITOT 0.8  PROT 5.7*  ALBUMIN 2.7*   Recent Labs  Lab 04/21/2020 0158  LIPASE 27   No results for input(s): AMMONIA in the last 168 hours. Coagulation Profile: No results for input(s): INR, PROTIME in the last 168 hours. Cardiac Enzymes: No results for input(s):  CKTOTAL, CKMB, CKMBINDEX, TROPONINI in the last 168 hours. BNP (last 3 results) No results for input(s): PROBNP in the last 8760 hours. HbA1C: No results for input(s): HGBA1C in the last 72 hours. CBG: No results for input(s): GLUCAP in the last 168 hours. Lipid Profile: No results for input(s): CHOL, HDL, LDLCALC, TRIG, CHOLHDL, LDLDIRECT in the last 72 hours. Thyroid Function Tests: No results for input(s): TSH, T4TOTAL, FREET4, T3FREE, THYROIDAB in the last 72 hours. Anemia Panel: No results for input(s): VITAMINB12, FOLATE, FERRITIN, TIBC, IRON, RETICCTPCT in the last 72 hours. Sepsis Labs: Recent Labs  Lab 04/17/2020 0200  LATICACIDVEN 1.5    Recent Results (from the past 240 hour(s))  Urine Culture     Status: Abnormal   Collection Time: 04/05/20  1:05 PM   Specimen: Urine, Clean Catch  Result Value Ref Range Status   Specimen Description   Final    URINE, CLEAN CATCH Performed at [facility], [address] [place], [place] [address]    Special Requests   Final    Normal Performed at [facility], [address] [place], [place] [address]    Culture (A)  Final    <10,000 COLONIES/mL INSIGNIFICANT GROWTH Performed at [facility], [place] [address] [place], [place] [address]    Report Status 04/30/2020 FINAL  Final  SARS Coronavirus 2 by RT PCR (hospital order, performed in [facility] hospital lab) Nasopharyngeal Nasopharyngeal Swab     Status: None   Collection Time: 04/23/2020  2:01 AM   Specimen: Nasopharyngeal Swab  Result Value Ref Range Status   SARS Coronavirus 2 NEGATIVE NEGATIVE Final    Comment: (NOTE) SARS-CoV-2 target nucleic acids are NOT DETECTED.  The SARS-CoV-2 RNA is generally detectable in upper and lower respiratory specimens during the acute phase of infection. The lowest concentration of SARS-CoV-2 viral copies this assay can detect is 250 copies / mL. A negative result does not preclude SARS-CoV-2 infection and  should not be used as the sole basis for treatment or other patient management decisions.  A negative result may occur with improper specimen collection / handling, submission of specimen other than nasopharyngeal swab, presence of viral mutation(s) within the areas targeted by this assay, and inadequate number of viral copies (<250 copies / mL). A negative result must be combined with clinical observations, patient history, and epidemiological information.  Fact Sheet for Patients:   StrictlyIdeas.no  Fact Sheet for Healthcare Providers: BankingDealers.co.za  This test is not yet approved or  cleared by the [place] FDA and has been authorized for detection and/or diagnosis of SARS-CoV-2 by FDA under an Emergency Use Authorization (EUA).  This EUA will remain in effect (meaning this test can be used) for the duration of the COVID-19 declaration under Section 564(b)(1) of the Act,  21 U.S.C. section 360bbb-3(b)(1), unless the authorization is terminated or revoked sooner.  Performed at [facility], [address] [place], [place] [address]   Body fluid culture     Status: None (Preliminary result)   Collection Time: 04/19/2020  4:35 PM   Specimen: PATH Cytology Peritoneal fluid  Result Value Ref Range Status   Specimen Description   Final    PERITONEAL Performed at [facility], [address] [place], [place] [address]    Special Requests   Final    NONE Performed at [facility], [address] [place], [place] [address]    Gram Stain   Final    FEW WBC PRESENT, PREDOMINANTLY MONONUCLEAR NO ORGANISMS SEEN    Culture   Final    NO GROWTH < 12 HOURS Performed at [facility], [place] [address] [place], [place] [address]    Report Status PENDING  Incomplete         Radiology Studies: CT ABDOMEN PELVIS WO CONTRAST  Result Date: 04/10/2020 CLINICAL DATA:  Abdominal distension and  nonlocalized abdominal pain. EXAM: CT ABDOMEN AND PELVIS WITHOUT CONTRAST TECHNIQUE: Multidetector CT imaging of the abdomen and pelvis was performed following the standard protocol without IV contrast. COMPARISON:  09/06/2019 and abdominal CT report from [facility] 01/14/2020 FINDINGS: Lower chest: Generous heart size. Dual-chamber pacer leads. Small pleural effusions on the left more than right with calcified pleural plaques. Hepatobiliary: Small appearance of the liver with some surface lobulation and caudate/fissure enlargement.Mild high-density at the dependent gallbladder without calcified stone or acute inflammatory finding Pancreas: Generalized fatty atrophy. Spleen: Unremarkable. Adrenals/Urinary Tract: Negative adrenals. Cystic densities on both sides of the renal cortex. Anterior and right lateral asymmetric bladder wall thickening. Although imperceptible on axial slices there is an intact posterior bladder wall by reformats Stomach/Bowel: No obstruction. No visible bowel inflammation. Proctitis by recent sigmoidoscopy, unremarkable by CT Vascular/Lymphatic: Multifocal atheromatous calcification. No mass or adenopathy. Reproductive:Mildly enlarged prostate. Other: Large volume ascites. There is reticulation of the greater omentum without nodularity or discrete masslike finding. Bilateral inguinal hernia repair. Musculoskeletal: No acute abnormalities. Lumbar spine degeneration, focally advanced at L4-5. IMPRESSION: 1. Large volume ascites which has seemingly increased from a outside CT report June 2021. There could be underlying liver cirrhosis. At sampling, consider including cytology given history of bladder cancer. 2. Small pleural effusions. 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 Electronically Signed   By: Monte Fantasia M.D.   On: 04/14/2020 05:42   CT HEAD WO CONTRAST  Result Date: 04/07/2020 CLINICAL DATA:  Neuro deficit, acute, stroke suspected; new left  arm weakness. EXAM: CT HEAD WITHOUT CONTRAST TECHNIQUE: Contiguous axial images were obtained from the base of the skull through the vertex without intravenous contrast. COMPARISON:  Prior head CT examinations 09/20/2015 and earlier. FINDINGS: Brain: Stable, moderate generalized parenchymal atrophy. Redemonstrated chronic small-vessel infarct within the posterior left centrum semiovale/corona radiata (series 4, image 39). Progressive background moderate ill-defined hypoattenuation within the cerebral white matter is nonspecific, but consistent with chronic small vessel ischemic disease. No acute demarcated cortical infarct is identified. A small chronic cortically based infarct is questioned within the right occipital lobe. There is no acute intracranial hemorrhage. No extra-axial fluid collection. No evidence of intracranial mass. No midline shift. Vascular: No hyperdense vessel.  Atherosclerotic calcifications Skull: Normal. Negative for fracture or focal lesion. Sinuses/Orbits: Visualized orbits show no acute finding. Mild ethmoid sinus mucosal thickening. No significant mastoid effusion.  IMPRESSION: No CT evidence of acute intracranial abnormality. A small chronic cortically based infarct is questioned within the right occipital lobe. Progressive moderate chronic small vessel ischemic disease. Stable, moderate generalized parenchymal atrophy. Mild ethmoid sinus mucosal thickening. Electronically Signed   By: Kellie Simmering DO   On: 04/07/2020 09:23   US Paracentesis  Result Date: 04/07/2020 EXAM: ULTRASOUND GUIDED  PARACENTESIS MEDICATIONS: None. COMPLICATIONS: None immediate. PROCEDURE: Informed written consent was obtained from the patient after a discussion of the risks, benefits and alternatives to treatment. A timeout was performed prior to the initiation of the procedure. Initial ultrasound scanning demonstrates a large amount of ascites within the right lower abdominal quadrant. The right lower abdomen  was prepped and draped in the usual sterile fashion. 1% lidocaine was used for local anesthesia. Following this, a Yueh catheter was introduced. An ultrasound image was saved for documentation purposes. The paracentesis was performed. The catheter was removed and a dressing was applied. The patient tolerated the procedure well without immediate post procedural complication. Patient received post-procedure intravenous albumin; see nursing notes for details. FINDINGS: A total of approximately 5,270 mL of cloudy fluid was removed. Samples were sent to the laboratory as requested by the clinical team. IMPRESSION: Successful ultrasound-guided paracentesis yielding 5270 mL of peritoneal fluid. Electronically Signed   By: Marcello Moores  Register   On: 04/07/2020 05:10   ECHOCARDIOGRAM COMPLETE  Result Date: 04/12/2020    ECHOCARDIOGRAM REPORT   Patient Name:   Joseph Houston Date of Exam: 04/22/2020 Medical Rec #:  [id number]         Height:       71.0 in Accession #:    [id number]        Weight:       224.0 lb Date of Birth:  June 10, 1936        BSA:          2.213 m Patient Age:    30 years          BP:           125/78 mmHg Patient Gender: M                 HR:           64 bpm. Exam Location:  [facility] Procedure: 2D Echo, Cardiac Doppler and Color Doppler Indications:     CHF- acute diastolic 502.77  History:         Patient has no prior history of Echocardiogram examinations.                  COPD; Risk Factors:Hypertension. STEMI.  Sonographer:     Sherrie Sport RDCS (AE) Referring Phys:  [id number] Royce Macadamia AGBATA Diagnosing Phys: Isaias Cowman MD IMPRESSIONS  1. Left ventricular ejection fraction, by estimation, is 50 to 55%. The left ventricle has low normal function. The left ventricle has no regional wall motion abnormalities. Left ventricular diastolic parameters are indeterminate.  2. Right ventricular systolic function is normal. The right ventricular size is normal. There is mildly elevated pulmonary artery systolic  pressure.  3. Left atrial size was mild to moderately dilated.  4. The mitral valve is normal in structure. Mild mitral valve regurgitation. No evidence of mitral stenosis.  5. The aortic valve is normal in structure. Aortic valve regurgitation is mild. No aortic stenosis is present.  6. The inferior vena cava is normal in size with greater than 50% respiratory variability, suggesting right atrial pressure of 3 mmHg. FINDINGS  Left  Ventricle: Left ventricular ejection fraction, by estimation, is 50 to 55%. The left ventricle has low normal function. The left ventricle has no regional wall motion abnormalities. The left ventricular internal cavity size was normal in size. There is no left ventricular hypertrophy. Left ventricular diastolic parameters are indeterminate. Right Ventricle: The right ventricular size is normal. No increase in right ventricular wall thickness. Right ventricular systolic function is normal. There is mildly elevated pulmonary artery systolic pressure. The tricuspid regurgitant velocity is 2.93  m/s, and with an assumed right atrial pressure of 10 mmHg, the estimated right ventricular systolic pressure is 64.3 mmHg. Left Atrium: Left atrial size was mild to moderately dilated. Right Atrium: Right atrial size was normal in size. Pericardium: There is no evidence of pericardial effusion. Mitral Valve: The mitral valve is normal in structure. Normal mobility of the mitral valve leaflets. Mild mitral valve regurgitation. No evidence of mitral valve stenosis. Tricuspid Valve: The tricuspid valve is normal in structure. Tricuspid valve regurgitation is mild . No evidence of tricuspid stenosis. Aortic Valve: The aortic valve is normal in structure. Aortic valve regurgitation is mild. No aortic stenosis is present. Aortic valve mean gradient measures 6.5 mmHg. Aortic valve peak gradient measures 11.0 mmHg. Aortic valve area, by VTI measures 2.01  cm. Pulmonic Valve: The pulmonic valve was normal in  structure. Pulmonic valve regurgitation is not visualized. No evidence of pulmonic stenosis. Aorta: The aortic root is normal in size and structure. Venous: The inferior vena cava is normal in size with greater than 50% respiratory variability, suggesting right atrial pressure of 3 mmHg. IAS/Shunts: No atrial level shunt detected by color flow Doppler. Additional Comments: A pacer wire is visualized.  LEFT VENTRICLE PLAX 2D LVIDd:         3.89 cm LVIDs:         2.59 cm LV PW:         2.02 cm LV IVS:        1.67 cm LVOT diam:     2.00 cm LV SV:         62 LV SV Index:   28 LVOT Area:     3.14 cm  RIGHT VENTRICLE RV Basal diam:  4.87 cm RV S prime:     13.70 cm/s TAPSE (M-mode): 3.2 cm LEFT ATRIUM             Index       RIGHT ATRIUM           Index LA diam:        6.10 cm 2.76 cm/m  RA Area:     18.20 cm LA Vol (A2C):   77.5 ml 35.03 ml/m RA Volume:   41.90 ml  18.94 ml/m LA Vol (A4C):   99.8 ml 45.11 ml/m LA Biplane Vol: 88.9 ml 40.18 ml/m  AORTIC VALVE                    PULMONIC VALVE AV Area (Vmax):    1.89 cm     PV Vmax:        0.60 m/s AV Area (Vmean):   1.93 cm     PV Peak grad:   1.4 mmHg AV Area (VTI):     2.01 cm     RVOT Peak grad: 3 mmHg AV Vmax:           165.50 cm/s AV Vmean:          116.000 cm/s AV VTI:  0.308 m AV Peak Grad:      11.0 mmHg AV Mean Grad:      6.5 mmHg LVOT Vmax:         99.80 cm/s LVOT Vmean:        71.100 cm/s LVOT VTI:          0.197 m LVOT/AV VTI ratio: 0.64  AORTA Ao Root diam: 3.60 cm MITRAL VALVE               TRICUSPID VALVE MV Area (PHT): 3.06 cm    TR Peak grad:   34.3 mmHg MV Decel Time: 248 msec    TR Vmax:        293.00 cm/s MV E velocity: 82.00 cm/s                            SHUNTS                            Systemic VTI:  0.20 m                            Systemic Diam: 2.00 cm Isaias Cowman MD Electronically signed by Isaias Cowman MD Signature Date/Time: 04/17/2020/1:33:29 PM    Final         Scheduled Meds:  amiodarone  200 mg  Oral Daily   atorvastatin  80 mg Oral q1800   Chlorhexidine Gluconate Cloth  6 each Topical Daily   clopidogrel  75 mg Oral Daily   furosemide  40 mg Intravenous Q12H   memantine  10 mg Oral BID   omega-3 acid ethyl esters  1 g Oral BID   pantoprazole  40 mg Oral Daily   sodium chloride flush  3 mL Intravenous Q12H   spironolactone  25 mg Oral Daily   cyanocobalamin  1,000 mcg Oral Daily   Continuous Infusions:  sodium chloride     cefTRIAXone (ROCEPHIN)  IV Stopped (04/07/20 1235)    Assessment & Plan:   Principal Problem:   UTI (urinary tract infection) Active Problems:   A-fib (HCC)   Bladder carcinoma (HCC)   AKI (acute kidney injury) ([place])   Coronary artery disease   Acute urinary retention   Anasarca   Ascites  Acute ischemic stroke-with LUE flaccid.  Likely etiology is cardioembolic due to A. fib with recent discontinuation of his anticoagulation. Neurology was consulted. Stat CT of the head was negative for bleed Unable to do MRI or MRA due to pacemaker Will need to repeat CT to determine size of stroke Per neurology okay to continue Plavix and high intensity statin Permissive hypertension up to 180 210 mmHg Check A1c and fasting lipid panel Telemetry neurochecks Speech for swallowing eval of PT OT Per neurology continue to hold Eliquis so it does not convert to hemorrhagic stroke We will hold Lasix and Aldactone for permissive hypertension this was discussed with nephrology and Dr. Chana Bode is in agreement  Urinary tract infection with acute urinary retention Patient complains of difficulty voiding and bladder scan reveals > 900cc of urine Patient also has pyuria Foley placed On IV Rocephin empirically  Follow-up urine culture     Anasarca Unclear etiology may be from a cardiac/renal origin Echo EF 50 to 55% Hold Lasix as above Nephrology on board   AKI-AKI possibly secondary to acute hepatorenal syndrome versus prerenal azotemia At  baseline  patient has a serum creatinine of 1.36 >> 2.11 Imaging is negative for obstructive uropathy Nephrologist input was appreciated-hold losartan.  For now will hold IV Lasix and spironolactone which he was getting but with his acute stroke we have to hold it for permissive hypertension as noted above  Ascites New onset No known history of liver cirrhosis Patient has a history of bladder cancer, ??  Ascites secondary to metastatic disease Status post paracentesis, 5 L of cloudy fluid removed.  WBC found in fluid culture we will follow up final results Continue IV antibiotics   History of atrial fibrillation Rate controlled Patient is on apixaban as primary prophylaxis for an acute stroke We will hold apixaban for now for anticipated paracentesis   History of coronary artery disease Status post stent angioplasty in 03/21 Continue Plavix and statin.  Metoprolol on hold due to above Per cardiology patient does not need dual antiplatelet therapy since he is also on apixaban-but will holding Eliquis for now to prevent transition to hemorrhagic stroke  History of bladder cancer Patient was diagnosed with high-grade urothelial carcinoma and is status post radiation and chemotherapy Follow-up with oncology as outpatient    DVT prophylaxis: SCD Code Status: Full Family Communication: Wife at bedside  Status is: Inpatient  Remains inpatient appropriate because:Inpatient level of care appropriate due to severity of illness   Dispo: The patient is from: Home              Anticipated d/c is to: home vs snf. likely snf              Anticipated d/c date is: 3 days              Patient currently is not medically stable to d/c.w/u pending. Requiring iv abx. As inpt had new acute stroke, w/u pending.            LOS: 1 day   Time spent: 45 minutes with more than 50% on [place], MD [facility] Pager [phone]  If 7PM-7AM, please contact  night-coverage [email or web address] Password [facility] 04/07/2020, 6:07 PM

## 2020-04-07 NOTE — Plan of Care (Signed)
Continuing with plan of care. 

## 2020-04-07 NOTE — Progress Notes (Signed)
[place], [place] 04/07/20  Subjective:  [name] with past medical history significant for COPD, atrial fibrillation on anticoagulation, hypertension, coronary artery disease, hepatic cirrhosis  admitted to [facility] after having abdominal pain, weakness and nausea/vomiting. Patient states he is having progressive shortness of breath. He states that his abdominal girth has worsening. He claims to be taking all his medicaiton as prescribed. He is unsure if he is keeping a fluid restriction or low salt diet.   Patient found resting in bed,reports new onset of left arm weakness today. His lower extremity chronic weakness staying the same. Denies worsening SOB, nausea or vomiting.   Objective:  Vital signs in last 24 hours:  Temp:  [97.4 F (36.3 C)-98.1 F (36.7 C)] 98.1 F (36.7 C) (09/03 1435) Pulse Rate:  [71-97] 93 (09/03 1435) Resp:  [12-20] 18 (09/03 1435) BP: (113-162)/(60-88) 121/71 (09/03 1435) SpO2:  [92 %-99 %] 95 % (09/03 1435) Weight:  [95.5 kg] 95.5 kg (09/03 1435)  Weight change:  Filed Weights   04/05/20 1301 04/07/20 1435  Weight: 101.6 kg 95.5 kg    Intake/Output:    Intake/Output Summary (Last 24 hours) at 04/07/2020 1611 Last data filed at 04/07/2020 1600 Gross per 24 hour  Intake 2450 ml  Output --  Net 2450 ml     Physical Exam: General: Alert, oriented,lethargic  HEENT Normocephalic.Atraumatic  Pulm/lungs Diminished at the bases  CVS/Heart  S1S2, regular  Abdomen:  Distended, Ascites +  Extremities: Left Arm unable to move, 2+ peripheral edema on lower extremities  Neurologic: Alert,oriented,lethargic  Skin: No acute lesions or rashes          Basic Metabolic Panel:  Recent Labs  Lab 04/05/20 1305 04/07/20 0646  NA 133* 136  K 4.1 4.0  CL 102 105  CO2 20* 21*  GLUCOSE 121* 95  BUN 39* 37*  CREATININE 2.11* 1.89*  CALCIUM 8.4* 8.5*     CBC: Recent Labs  Lab 04/05/20 1305 04/07/20 0646   WBC 6.4 6.9  HGB 12.9* 12.6*  HCT 39.7 38.3*  MCV 93.9 93.0  PLT 298 229     No results found for: HEPBSAG, HEPBSAB, HEPBIGM    Microbiology:  Recent Results (from the past 240 hour(s))  Urine Culture     Status: Abnormal   Collection Time: 04/05/20  1:05 PM   Specimen: Urine, Clean Catch  Result Value Ref Range Status   Specimen Description   Final    URINE, CLEAN CATCH Performed at [facility], [address], [place], [place] [address]    Special Requests   Final    Normal Performed at [facility], [address], [place], [place] [address]    Culture (A)  Final    <10,000 COLONIES/mL INSIGNIFICANT GROWTH Performed at [facility], [place] [address] [place], [place] [address]    Report Status 04/07/2020 FINAL  Final  SARS Coronavirus 2 by RT PCR (hospital order, performed in [place] hospital lab) Nasopharyngeal Nasopharyngeal Swab     Status: None   Collection Time: 04/25/2020  2:01 AM   Specimen: Nasopharyngeal Swab  Result Value Ref Range Status   SARS Coronavirus 2 NEGATIVE NEGATIVE Final    Comment: (NOTE) SARS-CoV-2 target nucleic acids are NOT DETECTED.  The SARS-CoV-2 RNA is generally detectable in upper and lower respiratory specimens during the acute phase of infection. The lowest concentration of SARS-CoV-2 viral copies this assay can detect is 250 copies / mL.  A negative result does not preclude SARS-CoV-2 infection and should not be used as the sole basis for treatment or other patient management decisions.  A negative result may occur with improper specimen collection / handling, submission of specimen other than nasopharyngeal swab, presence of viral mutation(s) within the areas targeted by this assay, and inadequate number of viral copies (<250 copies / mL). A negative result must be combined with clinical observations, patient history, and epidemiological information.  Fact Sheet for Patients:    StrictlyIdeas.no  Fact Sheet for Healthcare Providers: BankingDealers.co.za  This test is not yet approved or  cleared by the [place] FDA and has been authorized for detection and/or diagnosis of SARS-CoV-2 by FDA under an Emergency Use Authorization (EUA).  This EUA will remain in effect (meaning this test can be used) for the duration of the COVID-19 declaration under Section 564(b)(1) of the Act, 21 U.S.C. section 360bbb-3(b)(1), unless the authorization is terminated or revoked sooner.  Performed at [facility], [address] [place], [place] [address]   Body fluid culture     Status: None (Preliminary result)   Collection Time: 04/27/2020  4:35 PM   Specimen: PATH Cytology Peritoneal fluid  Result Value Ref Range Status   Specimen Description   Final    PERITONEAL Performed at [facility], [address], [place], [place] [address]    Special Requests   Final    NONE Performed at [facility], [address] [place], [place] [address]    Gram Stain   Final    FEW WBC PRESENT, PREDOMINANTLY MONONUCLEAR NO ORGANISMS SEEN    Culture   Final    NO GROWTH < 12 HOURS Performed at [facility], [place] [address] [place], [place] [address]    Report Status PENDING  Incomplete    Coagulation Studies: No results for input(s): LABPROT, INR in the last 72 hours.  Urinalysis: Recent Labs    04/05/20 1303  COLORURINE AMBER*  LABSPEC 1.023  PHURINE 5.0  GLUCOSEU NEGATIVE  HGBUR NEGATIVE  BILIRUBINUR NEGATIVE  KETONESUR NEGATIVE  PROTEINUR 30*  NITRITE POSITIVE*  LEUKOCYTESUR NEGATIVE      Imaging: CT ABDOMEN PELVIS WO CONTRAST  Result Date: 04/09/2020 CLINICAL DATA:  Abdominal distension and nonlocalized abdominal pain. EXAM: CT ABDOMEN AND PELVIS WITHOUT CONTRAST TECHNIQUE: Multidetector CT imaging of the abdomen and pelvis was performed following the standard protocol  without IV contrast. COMPARISON:  09/06/2019 and abdominal CT report from [facility] 01/14/2020 FINDINGS: Lower chest: Generous heart size. Dual-chamber pacer leads. Small pleural effusions on the left more than right with calcified pleural plaques. Hepatobiliary: Small appearance of the liver with some surface lobulation and caudate/fissure enlargement.Mild high-density at the dependent gallbladder without calcified stone or acute inflammatory finding Pancreas: Generalized fatty atrophy. Spleen: Unremarkable. Adrenals/Urinary Tract: Negative adrenals. Cystic densities on both sides of the renal cortex. Anterior and right lateral asymmetric bladder wall thickening. Although imperceptible on axial slices there is an intact posterior bladder wall by reformats Stomach/Bowel: No obstruction. No visible bowel inflammation. Proctitis by recent sigmoidoscopy, unremarkable by CT Vascular/Lymphatic: Multifocal atheromatous calcification. No mass or adenopathy. Reproductive:Mildly enlarged prostate. Other: Large volume ascites. There is reticulation of the greater omentum without nodularity or discrete masslike finding. Bilateral inguinal hernia repair. Musculoskeletal: No acute abnormalities. Lumbar spine degeneration, focally advanced at L4-5. IMPRESSION: 1. Large volume ascites which has seemingly increased from a outside CT report June 2021. There could be underlying liver cirrhosis. At sampling, consider including  cytology given history of bladder cancer. 2. Small pleural effusions. 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 Electronically Signed   By: Monte Fantasia M.D.   On: 04/30/2020 05:42   CT HEAD WO CONTRAST  Result Date: 04/07/2020 CLINICAL DATA:  Neuro deficit, acute, stroke suspected; new left arm weakness. EXAM: CT HEAD WITHOUT CONTRAST TECHNIQUE: Contiguous axial images were obtained from the base of the skull through the vertex without intravenous contrast. COMPARISON:   Prior head CT examinations 09/20/2015 and earlier. FINDINGS: Brain: Stable, moderate generalized parenchymal atrophy. Redemonstrated chronic small-vessel infarct within the posterior left centrum semiovale/corona radiata (series 4, image 39). Progressive background moderate ill-defined hypoattenuation within the cerebral white matter is nonspecific, but consistent with chronic small vessel ischemic disease. No acute demarcated cortical infarct is identified. A small chronic cortically based infarct is questioned within the right occipital lobe. There is no acute intracranial hemorrhage. No extra-axial fluid collection. No evidence of intracranial mass. No midline shift. Vascular: No hyperdense vessel.  Atherosclerotic calcifications Skull: Normal. Negative for fracture or focal lesion. Sinuses/Orbits: Visualized orbits show no acute finding. Mild ethmoid sinus mucosal thickening. No significant mastoid effusion. IMPRESSION: No CT evidence of acute intracranial abnormality. A small chronic cortically based infarct is questioned within the right occipital lobe. Progressive moderate chronic small vessel ischemic disease. Stable, moderate generalized parenchymal atrophy. Mild ethmoid sinus mucosal thickening. Electronically Signed   By: Kellie Simmering DO   On: 04/07/2020 09:23   US Paracentesis  Result Date: 04/07/2020 EXAM: ULTRASOUND GUIDED  PARACENTESIS MEDICATIONS: None. COMPLICATIONS: None immediate. PROCEDURE: Informed written consent was obtained from the patient after a discussion of the risks, benefits and alternatives to treatment. A timeout was performed prior to the initiation of the procedure. Initial ultrasound scanning demonstrates a large amount of ascites within the right lower abdominal quadrant. The right lower abdomen was prepped and draped in the usual sterile fashion. 1% lidocaine was used for local anesthesia. Following this, a Yueh catheter was introduced. An ultrasound image was saved for  documentation purposes. The paracentesis was performed. The catheter was removed and a dressing was applied. The patient tolerated the procedure well without immediate post procedural complication. Patient received post-procedure intravenous albumin; see nursing notes for details. FINDINGS: A total of approximately 5,270 mL of cloudy fluid was removed. Samples were sent to the laboratory as requested by the clinical team. IMPRESSION: Successful ultrasound-guided paracentesis yielding 5270 mL of peritoneal fluid. Electronically Signed   By: Marcello Moores  Register   On: 04/07/2020 05:10   ECHOCARDIOGRAM COMPLETE  Result Date: 04/24/2020    ECHOCARDIOGRAM REPORT   Patient Name:   LONNEY REVAK Date of Exam: 04/29/2020 Medical Rec #:  [id number]         Height:       71.0 in Accession #:    [id number]        Weight:       224.0 lb Date of Birth:  1936/03/12        BSA:          2.213 m Patient Age:    33 years          BP:           125/78 mmHg Patient Gender: M                 HR:           64 bpm. Exam Location:  [facility] Procedure: 2D Echo, Cardiac  Doppler and Color Doppler Indications:     CHF- acute diastolic 182.99  History:         Patient has no prior history of Echocardiogram examinations.                  COPD; Risk Factors:Hypertension. STEMI.  Sonographer:     Sherrie Sport RDCS (AE) Referring Phys:  [id number] Royce Macadamia AGBATA Diagnosing Phys: Isaias Cowman MD IMPRESSIONS  1. Left ventricular ejection fraction, by estimation, is 50 to 55%. The left ventricle has low normal function. The left ventricle has no regional wall motion abnormalities. Left ventricular diastolic parameters are indeterminate.  2. Right ventricular systolic function is normal. The right ventricular size is normal. There is mildly elevated pulmonary artery systolic pressure.  3. Left atrial size was mild to moderately dilated.  4. The mitral valve is normal in structure. Mild mitral valve regurgitation. No evidence of mitral stenosis.  5. The  aortic valve is normal in structure. Aortic valve regurgitation is mild. No aortic stenosis is present.  6. The inferior vena cava is normal in size with greater than 50% respiratory variability, suggesting right atrial pressure of 3 mmHg. FINDINGS  Left Ventricle: Left ventricular ejection fraction, by estimation, is 50 to 55%. The left ventricle has low normal function. The left ventricle has no regional wall motion abnormalities. The left ventricular internal cavity size was normal in size. There is no left ventricular hypertrophy. Left ventricular diastolic parameters are indeterminate. Right Ventricle: The right ventricular size is normal. No increase in right ventricular wall thickness. Right ventricular systolic function is normal. There is mildly elevated pulmonary artery systolic pressure. The tricuspid regurgitant velocity is 2.93  m/s, and with an assumed right atrial pressure of 10 mmHg, the estimated right ventricular systolic pressure is 78.9 mmHg. Left Atrium: Left atrial size was mild to moderately dilated. Right Atrium: Right atrial size was normal in size. Pericardium: There is no evidence of pericardial effusion. Mitral Valve: The mitral valve is normal in structure. Normal mobility of the mitral valve leaflets. Mild mitral valve regurgitation. No evidence of mitral valve stenosis. Tricuspid Valve: The tricuspid valve is normal in structure. Tricuspid valve regurgitation is mild . No evidence of tricuspid stenosis. Aortic Valve: The aortic valve is normal in structure. Aortic valve regurgitation is mild. No aortic stenosis is present. Aortic valve mean gradient measures 6.5 mmHg. Aortic valve peak gradient measures 11.0 mmHg. Aortic valve area, by VTI measures 2.01  cm. Pulmonic Valve: The pulmonic valve was normal in structure. Pulmonic valve regurgitation is not visualized. No evidence of pulmonic stenosis. Aorta: The aortic root is normal in size and structure. Venous: The inferior vena cava  is normal in size with greater than 50% respiratory variability, suggesting right atrial pressure of 3 mmHg. IAS/Shunts: No atrial level shunt detected by color flow Doppler. Additional Comments: A pacer wire is visualized.  LEFT VENTRICLE PLAX 2D LVIDd:         3.89 cm LVIDs:         2.59 cm LV PW:         2.02 cm LV IVS:        1.67 cm LVOT diam:     2.00 cm LV SV:         62 LV SV Index:   28 LVOT Area:     3.14 cm  RIGHT VENTRICLE RV Basal diam:  4.87 cm RV S prime:     13.70 cm/s TAPSE (M-mode): 3.2 cm LEFT  ATRIUM             Index       RIGHT ATRIUM           Index LA diam:        6.10 cm 2.76 cm/m  RA Area:     18.20 cm LA Vol (A2C):   77.5 ml 35.03 ml/m RA Volume:   41.90 ml  18.94 ml/m LA Vol (A4C):   99.8 ml 45.11 ml/m LA Biplane Vol: 88.9 ml 40.18 ml/m  AORTIC VALVE                    PULMONIC VALVE AV Area (Vmax):    1.89 cm     PV Vmax:        0.60 m/s AV Area (Vmean):   1.93 cm     PV Peak grad:   1.4 mmHg AV Area (VTI):     2.01 cm     RVOT Peak grad: 3 mmHg AV Vmax:           165.50 cm/s AV Vmean:          116.000 cm/s AV VTI:            0.308 m AV Peak Grad:      11.0 mmHg AV Mean Grad:      6.5 mmHg LVOT Vmax:         99.80 cm/s LVOT Vmean:        71.100 cm/s LVOT VTI:          0.197 m LVOT/AV VTI ratio: 0.64  AORTA Ao Root diam: 3.60 cm MITRAL VALVE               TRICUSPID VALVE MV Area (PHT): 3.06 cm    TR Peak grad:   34.3 mmHg MV Decel Time: 248 msec    TR Vmax:        293.00 cm/s MV E velocity: 82.00 cm/s                            SHUNTS                            Systemic VTI:  0.20 m                            Systemic Diam: 2.00 cm Isaias Cowman MD Electronically signed by Isaias Cowman MD Signature Date/Time: 05/04/2020/1:33:29 PM    Final      Medications:   . sodium chloride    . cefTRIAXone (ROCEPHIN)  IV Stopped (04/07/20 1235)   . amiodarone  200 mg Oral Daily  . atorvastatin  80 mg Oral q1800  . Chlorhexidine Gluconate Cloth  6 each Topical Daily  .  clopidogrel  75 mg Oral Daily  . furosemide  40 mg Intravenous Q12H  . memantine  10 mg Oral BID  . omega-3 acid ethyl esters  1 g Oral BID  . pantoprazole  40 mg Oral Daily  . sodium chloride flush  3 mL Intravenous Q12H  . spironolactone  25 mg Oral Daily  . cyanocobalamin  1,000 mcg Oral Daily   sodium chloride, HYDROcodone-acetaminophen, ondansetron **OR** ondansetron (ZOFRAN) IV, sodium chloride flush, traZODone  Assessment/ Plan:  84 y.o. male with COPD, atrial fibrillation on anticoagulation, hypertension, coronary artery disease, hepatic cirrhosis  admitted  to [facility] after having abdominal pain, weakness and nausea/vomiting.   Principal Problem:   UTI (urinary tract infection) Active Problems:   A-fib ([place])   Bladder carcinoma ([place])   AKI (acute kidney injury) ([place])   Coronary artery disease   Acute urinary retention   Anasarca   Ascites   #. Acute Renal Failure on CKD stage 111A   Recent Labs    04/05/20 1305 04/07/20 0646  CREATININE 2.11* 1.89*   Acute renal failure most likely secondary to acute hepatorenal syndrome versus prerenal azotemia.  - IV furosemide - PO spironolactone - holding losartan  #. Anemia of CKD  Hgb remains at  goal  Lab Results  Component Value Date   HGB 12.6 (L) 04/07/2020    #. HTN with CKD Blood pressure readings at goal  Keep holding amlodipine.  Continue metoprolol .  Monitor closely  # Suspected Acute Ischemic Stroke CT Head  IMPRESSION: No CT evidence of acute intracranial abnormality Neurology managing   Patient was seen and examined with Crosby Oyster, [place]. Additionally patient had large volume therapeutic paracentesis where greater than 5 liters removed and found to have spontaneous bacterial peritonitis. He is now on empiric antibiotics.  Above plan was discussed and agreed upon on the signing of this note.   Lavonia Dana , [facility]  9/3/20215:21 PM    LOS: 1 Lavonia Dana 9/3/20214:11  PM  Adelino Newton, [place]

## 2020-04-08 ENCOUNTER — Inpatient Hospital Stay: Payer: Medicare HMO

## 2020-04-08 DIAGNOSIS — I639 Cerebral infarction, unspecified: Secondary | ICD-10-CM

## 2020-04-08 LAB — CBC
HCT: 37.5 % — ABNORMAL LOW (ref 39.0–52.0)
Hemoglobin: 12.3 g/dL — ABNORMAL LOW (ref 13.0–17.0)
MCH: 30.7 pg (ref 26.0–34.0)
MCHC: 32.8 g/dL (ref 30.0–36.0)
MCV: 93.5 fL (ref 80.0–100.0)
Platelets: 175 10*3/uL (ref 150–400)
RBC: 4.01 MIL/uL — ABNORMAL LOW (ref 4.22–5.81)
RDW: 15.5 % (ref 11.5–15.5)
WBC: 8 10*3/uL (ref 4.0–10.5)
nRBC: 0 % (ref 0.0–0.2)

## 2020-04-08 LAB — BASIC METABOLIC PANEL
Anion gap: 9 (ref 5–15)
BUN: 31 mg/dL — ABNORMAL HIGH (ref 8–23)
CO2: 24 mmol/L (ref 22–32)
Calcium: 8.2 mg/dL — ABNORMAL LOW (ref 8.9–10.3)
Chloride: 101 mmol/L (ref 98–111)
Creatinine, Ser: 1.69 mg/dL — ABNORMAL HIGH (ref 0.61–1.24)
GFR calc Af Amer: 43 mL/min — ABNORMAL LOW (ref >60)
GFR calc non Af Amer: 37 mL/min — ABNORMAL LOW (ref >60)
Glucose, Bld: 142 mg/dL — ABNORMAL HIGH (ref 70–99)
Potassium: 3.7 mmol/L (ref 3.5–5.1)
Sodium: 134 mmol/L — ABNORMAL LOW (ref 135–145)

## 2020-04-08 LAB — AFP TUMOR MARKER: AFP, Serum, Tumor Marker: <0.9 ng/mL (ref 0.0–8.3)

## 2020-04-08 MED ORDER — SODIUM CHLORIDE 0.9 % IV SOLN
750.0000 mg | Freq: Three times a day (TID) | INTRAVENOUS | Status: DC
  Administered 2020-04-08 – 2020-04-10 (×5): 750 mg via INTRAVENOUS
  Filled 2020-04-08 (×9): qty 750

## 2020-04-08 MED ORDER — ALPRAZOLAM 0.25 MG PO TABS
0.2500 mg | ORAL_TABLET | Freq: Once | ORAL | Status: AC
  Administered 2020-04-08: 0.25 mg via ORAL
  Filled 2020-04-08: qty 1

## 2020-04-08 NOTE — Progress Notes (Signed)
PROGRESS NOTE    Joseph Houston  YBO:[id number] DOB: Dec 06, 1935 DOA: 04/05/2020 PCP: Tracie Harrier, MD    Brief Narrative:  Joseph Houston is a 84 y.o. male with medical history significant for high-grade urothelial carcinoma of the bladder status post chemo and radiation therapy, history of A. fib on anticoagulation, history of coronary artery disease, hypertension, chronic kidney disease stage III who presents to the emergency room for evaluation of generalized weakness which has progressively worsened over the last couple of weeks and now patient has to use a cane for ambulation to reduce his risk for falls because he is unsteady on his feet.  He complains of abdominal pain which is diffuse but mostly in the periumbilical area associated with nausea and vomiting but denies having any changes in his bowel habits.  He has shortness of breath with exertion but denies having any chest pain, no cough, no fever, no chills.  He also stated that he has not voided in the last 12 hours and a bladder scan revealed 900 cc of urine. Labs revealed sodium 133, potassium 4.1, chloride 102, bicarb 20, BUN 29, creatinine 2.1 compared to baseline of 1.36, AST 23, ALT 18, lactic acid 1.5, hemoglobin 12.9, hematocrit 39.7, platelet count 298. CT scan of abdomen and pelvis showed large volume ascites which has seemingly increased from an outside CT report June 2021. There could be underlying liver cirrhosis.     Consultants:   Neurology, nephrology  Procedures: CT, status post paracentesis  Antimicrobials:   Ceftriaxone   Subjective: Wife at bedside.  Patient is confused.  Her rolled over to his stomach and will try to roll him back.wife states hes confused. Pt not really answering my questions. Eyes closed.  Objective: Vitals:   04/07/20 1930 04/07/20 2311 04/08/20 0548 04/08/20 1215  BP: 110/64 104/60 120/68 130/64  Pulse: 90 91 88 85  Resp: 14 18 17 18   Temp: 97.6 F (36.4 C) 98.4 F (36.9 C)  97.8 F (36.6 C) 97.6 F (36.4 C)  TempSrc: Oral Oral Oral Oral  SpO2: 97% 96% 97% 97%  Weight:      Height:        Intake/Output Summary (Last 24 hours) at 04/08/2020 1621 Last data filed at 04/08/2020 1400 Gross per 24 hour  Intake 343 ml  Output 2000 ml  Net -1657 ml   Filed Weights   04/05/20 1301 04/07/20 1435  Weight: 101.6 kg 95.5 kg    Examination: Confused, nad. Eyes closed, not really responding to questions.  Neuro:Unable to fully assess. LUE still flaccid cta anteriorly Reg-ireg. S1/s2 Soft nt , nd +bs +pitting edema b/l    Data Reviewed: I have personally reviewed following labs and imaging studies  CBC: Recent Labs  Lab 04/05/20 1305 04/07/20 0646 04/08/20 0428  WBC 6.4 6.9 8.0  HGB 12.9* 12.6* 12.3*  HCT 39.7 38.3* 37.5*  MCV 93.9 93.0 93.5  PLT 298 229 277   Basic Metabolic Panel: Recent Labs  Lab 04/05/20 1305 04/07/20 0646 04/08/20 0428  NA 133* 136 134*  K 4.1 4.0 3.7  CL 102 105 101  CO2 20* 21* 24  GLUCOSE 121* 95 142*  BUN 39* 37* 31*  CREATININE 2.11* 1.89* 1.69*  CALCIUM 8.4* 8.5* 8.2*   GFR: Estimated Creatinine Clearance: 40.4 mL/min (A) (by C-G formula based on SCr of 1.69 mg/dL (H)). Liver Function Tests: Recent Labs  Lab 04/30/2020 0158  AST 23  ALT 18  ALKPHOS 44  BILITOT 0.8  PROT  5.7*  ALBUMIN 2.7*   Recent Labs  Lab 04/17/2020 0158  LIPASE 27   No results for input(s): AMMONIA in the last 168 hours. Coagulation Profile: No results for input(s): INR, PROTIME in the last 168 hours. Cardiac Enzymes: No results for input(s): CKTOTAL, CKMB, CKMBINDEX, TROPONINI in the last 168 hours. BNP (last 3 results) No results for input(s): PROBNP in the last 8760 hours. HbA1C: No results for input(s): HGBA1C in the last 72 hours. CBG: No results for input(s): GLUCAP in the last 168 hours. Lipid Profile: No results for input(s): CHOL, HDL, LDLCALC, TRIG, CHOLHDL, LDLDIRECT in the last 72 hours. Thyroid Function  Tests: No results for input(s): TSH, T4TOTAL, FREET4, T3FREE, THYROIDAB in the last 72 hours. Anemia Panel: No results for input(s): VITAMINB12, FOLATE, FERRITIN, TIBC, IRON, RETICCTPCT in the last 72 hours. Sepsis Labs: Recent Labs  Lab 04/05/2020 0200  LATICACIDVEN 1.5    Recent Results (from the past 240 hour(s))  Urine Culture     Status: Abnormal   Collection Time: 04/05/20  1:05 PM   Specimen: Urine, Clean Catch  Result Value Ref Range Status   Specimen Description   Final    URINE, CLEAN CATCH Performed at [facility], [address], [place], [place] [address]    Special Requests   Final    Normal Performed at [facility], [address] [place], [place] [address]    Culture (A)  Final    <10,000 COLONIES/mL INSIGNIFICANT GROWTH Performed at [facility], [place] [address] [place], [place] [address]    Report Status 04/16/2020 FINAL  Final  SARS Coronavirus 2 by RT PCR (hospital order, performed in [facility] hospital lab) Nasopharyngeal Nasopharyngeal Swab     Status: None   Collection Time: 04/10/2020  2:01 AM   Specimen: Nasopharyngeal Swab  Result Value Ref Range Status   SARS Coronavirus 2 NEGATIVE NEGATIVE Final    Comment: (NOTE) SARS-CoV-2 target nucleic acids are NOT DETECTED.  The SARS-CoV-2 RNA is generally detectable in upper and lower respiratory specimens during the acute phase of infection. The lowest concentration of SARS-CoV-2 viral copies this assay can detect is 250 copies / mL. A negative result does not preclude SARS-CoV-2 infection and should not be used as the sole basis for treatment or other patient management decisions.  A negative result may occur with improper specimen collection / handling, submission of specimen other than nasopharyngeal swab, presence of viral mutation(s) within the areas targeted by this assay, and inadequate number of viral copies (<250 copies / mL). A negative result must be combined with  clinical observations, patient history, and epidemiological information.  Fact Sheet for Patients:   StrictlyIdeas.no  Fact Sheet for Healthcare Providers: BankingDealers.co.za  This test is not yet approved or  cleared by the [place] FDA and has been authorized for detection and/or diagnosis of SARS-CoV-2 by FDA under an Emergency Use Authorization (EUA).  This EUA will remain in effect (meaning this test can be used) for the duration of the COVID-19 declaration under Section 564(b)(1) of the Act, 21 U.S.C. section 360bbb-3(b)(1), unless the authorization is terminated or revoked sooner.  Performed at [facility], [address] [place], [place] [address]   Body fluid culture     Status: None (Preliminary result)   Collection Time: 04/10/2020  4:35 PM   Specimen: PATH Cytology Peritoneal fluid  Result Value Ref Range Status   Specimen Description   Final    PERITONEAL Performed  at [facility], [address] [place], [place] [address]    Special Requests   Final    NONE Performed at [facility], [address] [place], [place] [address]    Gram Stain   Final    FEW WBC PRESENT, PREDOMINANTLY MONONUCLEAR NO ORGANISMS SEEN    Culture   Final    NO GROWTH 2 DAYS Performed at [facility], [place] [address] [place], [place] [address]    Report Status PENDING  Incomplete  CULTURE, BLOOD (ROUTINE X 2) w Reflex to ID Panel     Status: None (Preliminary result)   Collection Time: 04/07/20  3:01 PM   Specimen: BLOOD  Result Value Ref Range Status   Specimen Description BLOOD RIGHT AC  Final   Special Requests   Final    BOTTLES DRAWN AEROBIC AND ANAEROBIC Blood Culture adequate volume   Culture   Final    NO GROWTH < 24 HOURS Performed at [facility], [address] [place], [place] [address]    Report Status PENDING  Incomplete  CULTURE, BLOOD (ROUTINE X 2) w Reflex to  ID Panel     Status: None (Preliminary result)   Collection Time: 04/07/20  3:53 PM   Specimen: BLOOD  Result Value Ref Range Status   Specimen Description BLOOD RIGHT ANTECUBITAL  Final   Special Requests   Final    BOTTLES DRAWN AEROBIC AND ANAEROBIC Blood Culture adequate volume   Culture   Final    NO GROWTH < 24 HOURS Performed at [facility], [address], [place],  [address]    Report Status PENDING  Incomplete         Radiology Studies: CT HEAD WO CONTRAST  Result Date: 04/08/2020 CLINICAL DATA:  Stroke follow-up EXAM: CT HEAD WITHOUT CONTRAST TECHNIQUE: Contiguous axial images were obtained from the base of the skull through the vertex without intravenous contrast. COMPARISON:  Yesterday FINDINGS: Brain: Moderate posterior right frontal infarct with cytotoxic edema obscuring the cortex. There is also a small recent appearing infarct in the right occipital cortex just above a pre-existing cortical infarct. Small right superior cerebellar infarction which is also become apparent since prior. No hemorrhage, hydrocephalus, or collection. Chronic small vessel ischemia in the cerebral white matter. Vascular: No hyperdense vessel.  Atherosclerotic calcification Skull: Normal. Negative for fracture or focal lesion. Sinuses/Orbits: Right cataract resection.  No acute finding IMPRESSION: 1. Moderate acute infarct at the right posterior frontal cortex. 2. Small recent right occipital cortex infarct adjacent to a more chronic infarct. 3. Small acute right superior cerebellar infarct. Electronically Signed   By: Monte Fantasia M.D.   On: 04/08/2020 09:32   CT HEAD WO CONTRAST  Result Date: 04/07/2020 CLINICAL DATA:  Neuro deficit, acute, stroke suspected; new left arm weakness. EXAM: CT HEAD WITHOUT CONTRAST TECHNIQUE: Contiguous axial images were obtained from the base of the skull through the vertex without intravenous contrast. COMPARISON:  Prior head CT examinations  09/20/2015 and earlier. FINDINGS: Brain: Stable, moderate generalized parenchymal atrophy. Redemonstrated chronic small-vessel infarct within the posterior left centrum semiovale/corona radiata (series 4, image 39). Progressive background moderate ill-defined hypoattenuation within the cerebral white matter is nonspecific, but consistent with chronic small vessel ischemic disease. No acute demarcated cortical infarct is identified. A small chronic cortically based infarct is questioned within the right occipital lobe. There is no acute intracranial hemorrhage. No extra-axial fluid collection. No evidence of intracranial mass. No midline shift. Vascular: No  hyperdense vessel.  Atherosclerotic calcifications Skull: Normal. Negative for fracture or focal lesion. Sinuses/Orbits: Visualized orbits show no acute finding. Mild ethmoid sinus mucosal thickening. No significant mastoid effusion. IMPRESSION: No CT evidence of acute intracranial abnormality. A small chronic cortically based infarct is questioned within the right occipital lobe. Progressive moderate chronic small vessel ischemic disease. Stable, moderate generalized parenchymal atrophy. Mild ethmoid sinus mucosal thickening. Electronically Signed   By: Kellie Simmering DO   On: 04/07/2020 09:23   US Carotid Bilateral  Result Date: 04/08/2020 CLINICAL DATA:  Stroke-like symptoms EXAM: BILATERAL CAROTID DUPLEX ULTRASOUND TECHNIQUE: Pearline Cables scale imaging, color Doppler and duplex ultrasound were performed of bilateral carotid and vertebral arteries in the neck. COMPARISON:  Prior duplex carotid ultrasound 10/15/2019 FINDINGS: Criteria: Quantification of carotid stenosis is based on velocity parameters that correlate the residual internal carotid diameter with NASCET-based stenosis levels, using the diameter of the distal internal carotid lumen as the denominator for stenosis measurement. The following velocity measurements were obtained: RIGHT ICA: 142/14 cm/sec CCA:  99/2 cm/sec SYSTOLIC ICA/CCA RATIO:  1.9 ECA:  75 cm/sec LEFT ICA: 69/9 cm/sec CCA: 42/6 cm/sec SYSTOLIC ICA/CCA RATIO:  1.2 ECA:  70 cm/sec RIGHT CAROTID ARTERY: Focal heterogeneous atherosclerotic plaque in the proximal internal carotid artery. By peak systolic velocity criteria, the estimated stenosis falls in the 50-69% diameter range. RIGHT VERTEBRAL ARTERY:  Patent with normal antegrade flow. LEFT CAROTID ARTERY: No significant atherosclerotic plaque or evidence of stenosis in the internal carotid artery. LEFT VERTEBRAL ARTERY:  Patent with normal antegrade flow. IMPRESSION: 1. Moderate (50-69%) stenosis proximal right internal carotid artery secondary to heterogenous atherosclerotic plaque. 2. No significant atherosclerotic plaque or evidence of stenosis in the left internal carotid artery. 3. Vertebral arteries remain patent with normal antegrade flow. Signed, Criselda Peaches, MD, [facility] Electronically Signed   By: Jacqulynn Cadet M.D.   On: 04/08/2020 11:12   US Paracentesis  Result Date: 04/07/2020 EXAM: ULTRASOUND GUIDED  PARACENTESIS MEDICATIONS: None. COMPLICATIONS: None immediate. PROCEDURE: Informed written consent was obtained from the patient after a discussion of the risks, benefits and alternatives to treatment. A timeout was performed prior to the initiation of the procedure. Initial ultrasound scanning demonstrates a large amount of ascites within the right lower abdominal quadrant. The right lower abdomen was prepped and draped in the usual sterile fashion. 1% lidocaine was used for local anesthesia. Following this, a Yueh catheter was introduced. An ultrasound image was saved for documentation purposes. The paracentesis was performed. The catheter was removed and a dressing was applied. The patient tolerated the procedure well without immediate post procedural complication. Patient received post-procedure intravenous  albumin; see nursing notes for details. FINDINGS: A total of approximately 5,270 mL of cloudy fluid was removed. Samples were sent to the laboratory as requested by the clinical team. IMPRESSION: Successful ultrasound-guided paracentesis yielding 5270 mL of peritoneal fluid. Electronically Signed   By: Marcello Moores  Register   On: 04/07/2020 05:10        Scheduled Meds: . amiodarone  200 mg Oral Daily  . atorvastatin  80 mg Oral q1800  . Chlorhexidine Gluconate Cloth  6 each Topical Daily  . clopidogrel  75 mg Oral Daily  . memantine  10 mg Oral BID  . omega-3 acid ethyl esters  1 g Oral BID  . pantoprazole  40 mg Oral Daily  . sodium chloride flush  3 mL Intravenous Q12H  . cyanocobalamin  1,000 mcg Oral Daily  Continuous Infusions: . sodium chloride    . cefTRIAXone (ROCEPHIN)  IV Stopped (04/08/20 1228)    Assessment & Plan:   Principal Problem:   UTI (urinary tract infection) Active Problems:   A-fib (HCC)   Bladder carcinoma (HCC)   AKI (acute kidney injury) ([place])   Coronary artery disease   Acute urinary retention   Anasarca   Ascites  Acute ischemic stroke-with LUE flaccid.  Likely etiology is cardioembolic due to A. fib with recent discontinuation of his anticoagulation. Neurology was consulted. Stat CT of the head was negative for bleed Unable to do MRI or MRA due to pacemaker Per neurology okay to continue Plavix and high intensity statin Permissive hypertension up to 180 210 mmHg initially 9/4 . Confused today. Repeat CT of the head this a.m. reveals moderate acute infarct and couple other small infarcts please see full report -Per neurology okay to resume Eliquis tomorrow, approximately 3 days after stroke to minimize risk of hemorrhagic conversion Continue statin Blood pressure goal less than 140/90 Check A1c fasting lipid panel Frequent neuro checks Speech eval-completed mechanical soft diet consistent with gravies please see speech report PT/OT-recommends  CIR     Urinary tract infection with acute urinary retention Patient complains of difficulty voiding and bladder scan reveals > 900cc of urine Patient also has pyuria Foley placed Was started on IV Rocephin empirically  Urine culture less than 10,000 colonies     Anasarca Unclear etiology possibly cardiorenal source Echo EF 50 to 55% Holding ACE Lasix for now Nephrology following   AKI-AKI possibly secondary to acute hepatorenal syndrome versus prerenal azotemia At baseline patient has a serum creatinine of 1.36 >> 2.11..>>>1.69 Imaging is negative for obstructive uropathy Nephrologist input was appreciated- Holding losartan  Held IV Lasix and spironolactone yesterday due to permissive hypertension Creatinine is 1.69 today We will follow-up with nephrology     Ascites-New onset No known history of liver cirrhosis Patient has a history of bladder cancer, ??  Ascites secondary to metastatic disease Status post paracentesis, 5 L of cloudy fluid removed.  WBC found in fluid culture  Will follow up with final cultures Will switch from ceftriaxone to cefuroxime mean for treatment of peritonitis   History of atrial fibrillation Rate controlled Patient is on apixaban as primary prophylaxis for an acute stroke Eliquis was held for paracentesis.  And patient had acute stroke and will need to resume tomorrow to make sure it does not convert to hemorrhagic stroke as he is on Plavix   History of coronary artery disease Status post stent angioplasty in 03/21 Continue Plavix and statin.   Metoprolol on hold due to permissive hypertension.  If BP starts going up will resume. Per cardiology patient does not need dual antiplatelet therapy since he is also on apixaban-but will holding Eliquis for now to prevent transition to hemorrhagic stroke Will resume Eliquis and d/c plavix in am after confirming with cardiology  History of bladder cancer Patient was diagnosed with  high-grade urothelial carcinoma and is status post radiation and chemotherapy Follow-up with oncology as outpatient    DVT prophylaxis: SCD Code Status: Full Family Communication: Wife at bedside  Status is: Inpatient  Remains inpatient appropriate because:Inpatient level of care appropriate due to severity of illness   Dispo: The patient is from: Home              Anticipated d/c is to: CIR              Anticipated d/c date  is: 3 days              Patient currently is not medically stable to d/c.w/u pending. CT today with acute stroke. Pt is confused. CIR pending too.           LOS: 2 days   Time spent: 35 minutes with more than 50% on [place], MD [facility] Pager [phone]  If 7PM-7AM, please contact night-coverage [email or web address] Password [email or web address] 04/08/2020, 4:21 PM

## 2020-04-08 NOTE — Evaluation (Addendum)
Physical Therapy Evaluation Patient Details Name: Joseph Houston MRN: [id number] DOB: 14-May-1936 Today's Date: 04/08/2020   History of Present Illness  84 y.o. male with past medical history significant for COPD, bladder cancer,  atrial fibrillation on anticoagulation, hypertension, coronary artery disease, CKD stage III,  hepatic cirrhosis with ascites and anasarca admitted with Acute renal failure and UTI  With new onset left arm paralysis concerning for acute stroke. Stat CT head was obtained which rule out hemorrhage.  Patient not candidate for TPA as he is on anticoagulation with Eliquis, last dose within 48 hours as well as outside TPA window.  Clinically does not appear LVO due to absence of neglect both visual and sensory, supect a distal occlusion.   CTA not performed due to acute kidney injury. Cannot have MRI due to pacemaker.  Repeat head CT shows moderate acute infarct at the R posterior frontal cortex, small recent right occipital cortex infarct adjacent to a more chronic infarct, small acute right superior cerebellar infarct. CT scan of abdomen and pelvis showed large volume ascites which has seemingly increased from an outside CT report June 2021.    Clinical Impression  Patient drowsy and oriented to self only; unable to provide history. Wife, [name], at bedside and provided detailed history. Prior to admission patient ambulated I with no AD and was I with ADLs. He required some help with IADLs such as driving and laundry. Patient lives with his wife in a single story home with 3 steps to enter, handrails on both side. Per wife, cognition was intact prior to this episode. Upon eval, patient neglects left UE which is flacid and does not fully use L LE despite having good strength for hip flexion, knee extension, and ankle movements. Patient requires mod A to perform most bed mobility and max A for transfers. Patient lacks trunk control or awareness of L LE with left lean in sitting and  standing that requires constant support when upright. Did not attempt ambulation due to patient requesting urgent need for BM. Patient has experienced a profound decrease in functional independence and strength compared to baseline and would benefit from intensive inpatient rehab to maximize recovery of function prior to safe return home. Patient would benefit from skilled physical therapy to address impairments and functional limitations (see PT Problem List below) to work towards stated goals and return to PLOF or maximal functional independence.        Follow Up Recommendations CIR    Equipment Recommendations  3in1 (PT) (to be determined in next venue of care and as pt progress)    Recommendations for Other Services OT consult;Rehab consult     Precautions / Restrictions Precautions Precautions: Fall Restrictions Weight Bearing Restrictions: No Other Position/Activity Restrictions: left UE flaccid      Mobility  Bed Mobility Overal bed mobility: Needs Assistance Bed Mobility: Rolling;Supine to Sit;Sit to Supine Rolling: Mod assist;+2 for safety/equipment   Supine to sit: Min assist Sit to supine: Mod assist   General bed mobility comments: patient rolls to left side to place and remove bedpan with min A. Transfers supine to sit with left UE support and sit to supine with mod A at trunk. Scoots up in bed with mod A at draw sheet. Unable to maintain balance in sitting with left lean (not pushing but unable to resist gravity to that side).  Transfers Overall transfer level: Needs assistance   Transfers: Sit to/from Stand Sit to Stand: Max assist  General transfer comment: sit <> stand from thigh high bed with max A hand held assist managing flacid left UE and left trunk lean. Required multiple attempts and able to stand for about 10 seconds before needin to sit. States he needs to have a bowel movement urgently so returned to bed to use bedpan instead of attemptin  transfer to chair.  Ambulation/Gait             General Gait Details: not safe/able at this time  Stairs            Wheelchair Mobility    Modified Rankin (Stroke Patients Only)       Balance Overall balance assessment: Needs assistance Sitting-balance support: Bilateral upper extremity supported;Single extremity supported Sitting balance-Leahy Scale: Poor Sitting balance - Comments: patient requires mod A to prevent falling to the left due to lack of trunk control Postural control: Left lateral lean Standing balance support: Bilateral upper extremity supported Standing balance-Leahy Scale: Poor Standing balance comment: Patient requires mod A in standing to prevent lateral trunk lean and maintain balance.                             Pertinent Vitals/Pain Pain Assessment: Faces Faces Pain Scale: Hurts even more Pain Location: back pain when laying on heart monitor cord. Removed cord and resolve issue Pain Intervention(s): Repositioned;Monitored during session;Limited activity within patient's tolerance    Home Living Family/patient expects to be discharged to:: Private residence Living Arrangements: Spouse/significant other Available Help at Discharge: Family Type of Home: House Home Access: Stairs to enter Entrance Stairs-Rails: Left;Right;Can reach both Technical brewer of Steps: 3 Home Layout: One level Home Equipment: Cane - quad Additional Comments: cannot find RW    Prior Function Level of Independence: Needs assistance   Gait / Transfers Assistance Needed: ambulated I with no AD prior to 1 week ago  ADL's / Homemaking Assistance Needed: independent with ADLs, did some meal prep. required help with IADLs including driving        Hand Dominance   Dominant Hand: Right    Extremity/Trunk Assessment   Upper Extremity Assessment Upper Extremity Assessment: Generalized weakness;LUE deficits/detail LUE Deficits / Details: Unable  to feel touch hand to shoulder. Flaccid.    Lower Extremity Assessment Lower Extremity Assessment: Generalized weakness;LLE deficits/detail (able to lift both legs off the bed one at a time.) LLE Deficits / Details: Patient positioned with L LE off edge of bed upon arrival. Able to lift straight leg raise from bed and at least 4/5 ankle MMT for PF and DF. However, patient appears unable to keep L LE in hooklying and does not push with that leg when scooting up in bed.    Cervical / Trunk Assessment Cervical / Trunk Assessment: Other exceptions Cervical / Trunk Exceptions: left lateral lean  Communication   Communication: No difficulties  Cognition Arousal/Alertness: Lethargic Behavior During Therapy: Flat affect Overall Cognitive Status: Impaired/Different from baseline Area of Impairment: Orientation                 Orientation Level: Disoriented to;Place;Time;Situation             General Comments: Wife, [name] states he did not have these cognitive difficulties until now      General Comments General comments (skin integrity, edema, etc.): patient requires cuing to open eyes frequently. appears to neglect L UE and possibly L LE    Exercises Other Exercises Other Exercises: sitting  at edge of bed to practice trunk control ~ 5 min. Standing balance ~ 10 seconds. educated patient and wife on role of PT in acute care setting   Assessment/Plan    PT Assessment Patient needs continued PT services  PT Problem List Decreased strength;Decreased coordination;Decreased cognition;Impaired sensation;Decreased activity tolerance;Impaired tone;Decreased knowledge of use of DME;Decreased balance;Decreased safety awareness;Decreased mobility;Decreased knowledge of precautions       PT Treatment Interventions DME instruction;Balance training;Gait training;Neuromuscular re-education;Stair training;Cognitive remediation;Functional mobility training;Patient/family education;Therapeutic  activities;Therapeutic exercise    PT Goals (Current goals can be found in the Care Plan section)  Acute Rehab PT Goals PT Goal Formulation: Patient unable to participate in goal setting Time For Goal Achievement: 04/22/20    Frequency Min 1X/week   Barriers to discharge Decreased caregiver support;Inaccessible home environment patient requires heavy physical assist and constant supervision    Co-evaluation               AM-PAC PT "6 Clicks" Mobility  Outcome Measure Help needed turning from your back to your side while in a flat bed without using bedrails?: A Lot Help needed moving from lying on your back to sitting on the side of a flat bed without using bedrails?: A Lot Help needed moving to and from a bed to a chair (including a wheelchair)?: Total Help needed standing up from a chair using your arms (e.g., wheelchair or bedside chair)?: A Lot Help needed to walk in hospital room?: Total Help needed climbing 3-5 steps with a railing? : Total 6 Click Score: 9    End of Session Equipment Utilized During Treatment: Gait belt Activity Tolerance: Patient limited by fatigue;Patient limited by lethargy;Other (comment) (sensation to have urgent bowel movement) Patient left: in bed;with family/visitor present;with nursing/sitter in room;Other (comment) (nursing took over to start IV, MD discussin care with wife/pt) Nurse Communication: Mobility status PT Visit Diagnosis: Unsteadiness on feet (R26.81);Hemiplegia and hemiparesis;Difficulty in walking, not elsewhere classified (R26.2);Muscle weakness (generalized) (M62.81) Hemiplegia - Right/Left: Left Hemiplegia - dominant/non-dominant: Non-dominant Hemiplegia - caused by: Cerebral infarction    Time: 8768-1157 PT Time Calculation (min) (ACUTE ONLY): 29 min   Charges:   PT Evaluation $PT Eval Moderate Complexity: 1 Mod PT Treatments $Therapeutic Activity: 8-22 mins        Everlean Alstrom. Graylon Good, PT, DPT 04/08/20, 11:49  AM

## 2020-04-08 NOTE — Plan of Care (Signed)
Continuing with plan of care. 

## 2020-04-08 NOTE — Evaluation (Addendum)
Clinical/Bedside Swallow Evaluation Patient Details  Name: Joseph Houston MRN: [id number] Date of Birth: 19-Aug-1935  Today's Date: 04/08/2020 Time: SLP Start Time (ACUTE ONLY): 39 SLP Stop Time (ACUTE ONLY): 1055 SLP Time Calculation (min) (ACUTE ONLY): 50 min  Past Medical History:  Past Medical History:  Diagnosis Date  . Anemia    was treated in the past, not now  . Anxiety   . Arthritis    knees  . BPH (benign prostatic hypertrophy)   . Chronic kidney disease    stage 3 ckd  . COPD (chronic obstructive pulmonary disease) ([place])    has had most of his life. has not used an inhaler for years  . Coronary artery disease   . Difficult intubation    pt reports bad sore throat after surgery. pt unsure of this response  . Dyspnea   . Dysrhythmia 2016   atrial fibrillation, bradycardia  . GERD (gastroesophageal reflux disease)   . Headache   . History of hiatal hernia   . History of kidney stones 04/2018  . Hypertension   . Pneumonia   . Pre-diabetes    dr. Ginette Pitman following  . Presence of permanent cardiac pacemaker 2017  . Psoriasis 2019   elbows  . Restless leg   . STEMI involving right coronary artery ([place]) 10/07/2019  . Vertigo 1992  . Yeast infection    in groin for 50 years   Past Surgical History:  Past Surgical History:  Procedure Laterality Date  . CATARACT EXTRACTION W/PHACO Right 09/25/2016   Procedure: CATARACT EXTRACTION PHACO AND INTRAOCULAR LENS PLACEMENT (IOC);  Surgeon: Estill Cotta, MD;  Location: [facility];  Service: Ophthalmology;  Laterality: Right;  [place] 01:58 AP% 18.1 CDE 43.36 Fluid pack # [id number]  . CORONARY STENT INTERVENTION N/A 10/06/2019   Procedure: CORONARY STENT INTERVENTION;  Surgeon: Yolonda Kida, MD;  Location: [place] [facility];  Service: Cardiovascular;  Laterality: N/A;  MID RCA  . CORONARY/GRAFT ACUTE MI REVASCULARIZATION N/A 10/06/2019   Procedure: Coronary/Graft Acute MI Revascularization;  Surgeon: Yolonda Kida,  MD;  Location: [place] [facility];  Service: Cardiovascular;  Laterality: N/A;  . CYSTOSCOPY N/A 09/28/2019   Procedure: CYSTOSCOPY;  Surgeon: Abbie Sons, MD;  Location: [facility];  Service: Urology;  Laterality: N/A;  . CYSTOSCOPY/URETEROSCOPY/HOLMIUM LASER/STENT PLACEMENT Right 04/13/2018   Procedure: CYSTOSCOPY/URETEROSCOPY/HOLMIUM LASER/STENT PLACEMENT;  Surgeon: Hollice Espy, MD;  Location: [facility];  Service: Urology;  Laterality: Right;  . ELECTROPHYSIOLOGIC STUDY N/A 12/22/2014   Procedure: CARDIOVERSION;  Surgeon: Teodoro Spray, MD;  Location: [facility];  Service: Cardiovascular;  Laterality: N/A;  . FINGER SURGERY Right 1950   index finger cut off half way  . HERNIA REPAIR Bilateral    inguinal repaired x 4  . INSERT / REPLACE / REMOVE PACEMAKER     12/17  . JOINT REPLACEMENT Right 2012   partial knee replacement  . LEFT HEART CATH AND CORONARY ANGIOGRAPHY N/A 10/06/2019   Procedure: LEFT HEART CATH AND CORONARY ANGIOGRAPHY;  Surgeon: Yolonda Kida, MD;  Location: [place] [facility];  Service: Cardiovascular;  Laterality: N/A;  . PACEMAKER INSERTION Left 07/17/2016   Procedure: INSERTION PACEMAKER;  Surgeon: Isaias Cowman, MD;  Location: [facility];  Service: Cardiovascular;  Laterality: Left;  . PARTIAL KNEE ARTHROPLASTY Right 2012  . TONSILLECTOMY  1950  . TOTAL KNEE ARTHROPLASTY Left 09/30/2017   Procedure: TOTAL KNEE ARTHROPLASTY;  Surgeon: Hessie Knows, MD;  Location: [facility];  Service: Orthopedics;  Laterality: Left;  .  TRANSURETHRAL RESECTION OF BLADDER TUMOR N/A 09/28/2019   Procedure: TRANSURETHRAL RESECTION OF BLADDER TUMOR (TURBT);  Surgeon: Abbie Sons, MD;  Location: [facility];  Service: Urology;  Laterality: N/A;  . TRANSURETHRAL RESECTION OF PROSTATE     HPI:  Pt is a 84 y.o. male with medical history significant for high-grade urothelial carcinoma of the bladder status post chemo and radiation therapy, history of A. fib on anticoagulation,  history of coronary artery disease, hypertension, chronic kidney disease stage III who presents to the emergency room for evaluation of generalized weakness which has progressively worsened over the last couple of weeks and now patient has to use a cane for ambulation to reduce his risk for falls because he is unsteady on his feet.  He has shortness of breath with exertion but denies having any chest pain, no cough, no fever, no chills.  He also stated that he has not voided in the last 12 hours and a bladder scan revealed 900 cc of urine.  CT scan of abdomen and pelvis showed large volume ascites which has seemingly increased from an outside CT report June 2021. There could be underlying liver cirrhosis per MD.  Pt was admitted w/ dx of UTI; Neurology was consulted for inability to move LUE - new weakness per report.    Assessment / Plan / Recommendation Clinical Impression  Pt appears to present w/ generalized deconditioning and LUE weakness requiring much support physically to sit upright in bed but overall grossly adequate oropharyngeal phase swallow w/ No overt oropharyngeal phase dysphagia noted, No neuromuscular deficits noted.Pt consumed po trials w/ No overt, clinical s/s of aspiration during po trials. Pt appears at reduced risk for aspiration when following general aspiration precautions and supported during oral intake d/t weakness/deconditioning; LUE weakness. During po trials, pt consumed all consistencies w/ no overt coughing, decline in vocal quality, or change in respiratory presentation during/post trials. Oral phase appeared grossly [facility] w/ timely bolus management, mastication, and control of bolus propulsion for A-P transfer for swallowing. Oral clearing achieved w/ all trial consistencies w/ min Time. Pt wears Full Dentures w/ Bottom plate loose at Baseline -- he does not use adhesive to help secure per pt/wife. OM Exam appeared [facility] w/ no unilateral weakness noted. Speech Clear. Pt and wife  endorse loss of Taste since chemo/xrt txs for Bladder Ca. Pt fed self w/ setup and min-mod support -- new LUE weakness per pt. Recommend a more Mech Soft consistency diet w/ well-Cut meats, moistened foods; Thin liquids -- monitor cup/straw use. Recommend general aspiration precautions, Pills WHOLE in Puree for safer, easier swallowing. Education given on Pills in Puree; food consistencies and easy to eat options; general aspiration precautions. Menu provided to pt/wife. Wife and pt stated he feels he is at his Baseline from a Cognitive-linguistic status - no change in his verbal communication w/ wife or NSG reported. NSG to reconsult if any new needs arise. NSG agreed. Pt/Wife agreed. SLP Visit Diagnosis: Dysphagia, unspecified (R13.10) (full Dentures -- loose bottom plate; weakness)    Aspiration Risk  Risk for inadequate nutrition/hydration (loss of taste since chemo/XRT for bladder)    Diet Recommendation  Mech Soft diet consistency w/ Gravies on meats/foods to moisten and add flavor; Thin liquids. General aspiration precautions; Reflux precautions. Support at meals w/ feeding d/t LUE weakness.  Medication Administration: Whole meds with puree (vs need to Crush for ease of swallowing)    Other  Recommendations Recommended Consults:  (Dietician f/u for support w/ diet/needs)  Oral Care Recommendations: Oral care BID;Oral care before and after PO;Staff/trained caregiver to provide oral care (Denture care) Other Recommendations:  (n/a)   Follow up Recommendations None      Frequency and Duration  (n/a)   (n/a)       Prognosis Prognosis for Safe Diet Advancement: Good Barriers to Reach Goals:  (waekness; deconditioning; lack of taste)      Swallow Study   General Date of Onset: 04/05/2020 HPI: Pt is a 84 y.o. male with medical history significant for high-grade urothelial carcinoma of the bladder status post chemo and radiation therapy, history of A. fib on anticoagulation, history of  coronary artery disease, hypertension, chronic kidney disease stage III who presents to the emergency room for evaluation of generalized weakness which has progressively worsened over the last couple of weeks and now patient has to use a cane for ambulation to reduce his risk for falls because he is unsteady on his feet.  He has shortness of breath with exertion but denies having any chest pain, no cough, no fever, no chills.  He also stated that he has not voided in the last 12 hours and a bladder scan revealed 900 cc of urine.  CT scan of abdomen and pelvis showed large volume ascites which has seemingly increased from an outside CT report June 2021. There could be underlying liver cirrhosis per MD.  Pt was admitted w/ dx of UTI; Neurology was consulted for inability to move LUE - new weakness per report.  Type of Study: Bedside Swallow Evaluation Previous Swallow Assessment: none reported Diet Prior to this Study: Regular;Thin liquids Temperature Spikes Noted: No (wbc 8.0) Respiratory Status: Room air History of Recent Intubation: No Behavior/Cognition: Alert;Cooperative;Pleasant mood;Distractible;Requires cueing (needed verbal cues for follow through) Oral Cavity Assessment: Within Functional Limits (adequate) Oral Care Completed by SLP: Yes Oral Cavity - Dentition: Dentures, top;Dentures, bottom (loose bottom denture plate - no adhesive use) Vision: Functional for self-feeding Self-Feeding Abilities: Able to feed self;Needs assist;Needs set up (weak LUE) Patient Positioning: Upright in bed (needed support for positioning upright; cues) Baseline Vocal Quality: Normal (min engagement but adequate and answered ?s) Volitional Cough: Strong Volitional Swallow: Able to elicit    Oral/Motor/Sensory Function Overall Oral Motor/Sensory Function: Within functional limits   Ice Chips Ice chips: Within functional limits Presentation: Spoon (fed; 2 trials)   Thin Liquid Thin Liquid: Within functional  limits Presentation: Cup;Self Fed;Straw (~4-5 trials via each method)    Nectar Thick Nectar Thick Liquid: Not tested   Honey Thick Honey Thick Liquid: Not tested   Puree Puree: Within functional limits Presentation: Spoon (fed; 5 trials)   Solid     Solid: Within functional limits (grossly) Presentation: Spoon (fed; 3 trials)       Orinda Kenner, MS, CCC-SLP Speech Language Pathologist Rehab Services [phone] [facility] 04/08/2020,11:00 AM

## 2020-04-08 NOTE — Progress Notes (Addendum)
Reason for consult: Stroke, left side weakness  Subjective: Patient did not sleep much last night. Appears drwosy this morning but following commands. Slightly confused.    ROS: negative except above   Examination  Vital signs in last 24 hours: Temp:  [97.6 F (36.4 C)-98.4 F (36.9 C)] 97.6 F (36.4 C) (09/04 1215) Pulse Rate:  [85-97] 85 (09/04 1215) Resp:  [14-18] 18 (09/04 1215) BP: (104-130)/(60-71) 130/64 (09/04 1215) SpO2:  [92 %-97 %] 97 % (09/04 1215) Weight:  [95.5 kg] 95.5 kg (09/03 1435)  General: lying in bed CVS: pulse-normal rate and rhythm RS: breathing comfortably Extremities: normal   Neuro: MS: Confused, oriented to himself but not month. follows commands CN: pupils equal and reactive,  EOMI, face symmetric, tongue midline, normal sensation over face, Motor: LUE 0/5 LLE 3+/5 RUE 5/5 RLE 4/5 Reflexes:  plantars: flexor Coordination: normal Gait: not tested  Basic Metabolic Panel: Recent Labs  Lab 04/05/20 1305 04/07/20 0646 04/08/20 0428  NA 133* 136 134*  K 4.1 4.0 3.7  CL 102 105 101  CO2 20* 21* 24  GLUCOSE 121* 95 142*  BUN 39* 37* 31*  CREATININE 2.11* 1.89* 1.69*  CALCIUM 8.4* 8.5* 8.2*    CBC: Recent Labs  Lab 04/05/20 1305 04/07/20 0646 04/08/20 0428  WBC 6.4 6.9 8.0  HGB 12.9* 12.6* 12.3*  HCT 39.7 38.3* 37.5*  MCV 93.9 93.0 93.5  PLT 298 229 175     Coagulation Studies: No results for input(s): LABPROT, INR in the last 72 hours.  Imaging Reviewed:   Repeat CT head shows : Moderate acute infarct at the right posterior frontal cortex. 2. Small recent right occipital cortex infarct adjacent to a more chronic infarct. 3. Small acute right superior cerebellar infarct.  ASSESSMENT AND PLAN  84 y.o. male with past medical history significant for COPD,bladder cancer,  atrial fibrillation on anticoagulation, hypertension, coronary artery disease, CKD,  hepatic cirrhosis with ascites and anasarca admitted with Acute renal  failure and UTI  With new onset left arm paralysis concerning for acute stroke. Stat CT head was obtained which rule out hemorrhage.  Patient not candidate for TPA as he is on anticoagulation with Eliquis, last dose within 48 hours as well as outside TPA window.  Clinically does not appear LVO due to absence of neglect both visual and sensory, supect a distal occlusion.   CTA not performed due to acute kidney injury.  Etiology of stroke likely cardioembolic due to Afib - with recent discontinuation of AC due to ascitis requiring peritoneal dialysis   Repeat CT head shows right frontal infarct as well 2 smaller infarcts- likely embolic shower. While patient does have carotid stenosis - this less likely symptomatic as CT head also shows acute infarcts in the occipital love and cerebellum likely favoring cardioembolic etiology of stroke.   Acute Ischemic Stroke: Multiple embolic infarctions  Atrial fibrillation with Eliquis held for PD Right Internal Carotid Stenosis New diagnosis of cirrhosis with ascitis  Acute Kidney injury     Recommendations #CT head: Moderate acute infarct at the right posterior frontal cortex,  right occipital cortex infarct adjacent to a more chronic infarct and small acute right superior cerebellar infarct. # Carotid doppler: Moderate (50-69%) stenosis proximal right internal carotid artery secondary to heterogenous atherosclerotic plaque: recommend outpatient vascular surgery consult  #Transthoracic Echo : completed yesterday- EF 50-55%, no RWMA, dilated LA, no thrombus.  # blood cultures ordered  # OK to resume Eliquis tomorrow, approximately 3 days after stroke  to minimize risk of hemorrhagic conversion #Start or continue Atorvastatin 40 mg/other high intensity statin # BP goal: less than 140/90 mmHg # HBAIC and Lipid profile # Telemetry monitoring # Frequent neuro checks # stroke swallow screen    Joseph Houston [facility] Pager Number  [phone] For questions after 7pm please refer to AMION to reach the Neurologist on call

## 2020-04-08 NOTE — Progress Notes (Signed)
Inpatient Rehab Admissions Coordinator Note:   Per therapy recommendations, pt was screened for CIR candidacy by Lanett Lasorsa, MS CCC-SLP. At this time, Pt. Appears to have functional decline and is a good candidate for CIR. Will place order for rehab consult per protocol.  Please contact me with questions.   Carlinda Ohlson, MS, CCC-SLP Rehab Admissions Coordinator  [phone] (celll) [phone] (office)  

## 2020-04-08 NOTE — Evaluation (Signed)
Occupational Therapy Evaluation Patient Details Name: Joseph Houston MRN: [id number] DOB: Dec 17, 1935 Today's Date: 04/08/2020    History of Present Illness 84 y.o. male with past medical history significant for COPD, bladder cancer,  atrial fibrillation on anticoagulation, hypertension, coronary artery disease, CKD stage III,  hepatic cirrhosis with ascites and anasarca admitted with Acute renal failure and UTI  With new onset left arm paralysis concerning for acute stroke. Stat CT head was obtained which rule out hemorrhage.  Patient not candidate for TPA as he is on anticoagulation with Eliquis, last dose within 48 hours as well as outside TPA window.  Clinically does not appear LVO due to absence of neglect both visual and sensory, supect a distal occlusion.   CTA not performed due to acute kidney injury. Cannot have MRI due to pacemaker.  Repeat head CT shows moderate acute infarct at the R posterior frontal cortex, small recent right occipital cortex infarct adjacent to a more chronic infarct, small acute right superior cerebellar infarct. CT scan of abdomen and pelvis showed large volume ascites which has seemingly increased from an outside CT report June 2021.   Clinical Impression   Joseph Houston was seen for OT evaluation this date. Prior to hospital admission, pt was Independent for mobility and ADLs. Pt lives c wife in [facility]. Pt presents to acute OT demonstrating impaired ADL performance, functional cognition, and functional mobility 2/2 functional impairments of non-dominant LUE, functional strength/balance/vision deficits, and decreased safety awareness. Currently pt demonstrates 0/5 LUE strength, sensation to light pressure intact. Oriented to self and location only, follows commands c repetition.   Pt is R hand dominant and requires SETUP self-drinking at bed level. SETUP + MIN A self-feeding at bed level - TOTAL A to complete (pt spills 2/2 tremor and requests wife's assisstance for  thoroughness, reports difficulty seeing utensils and keeps eyes closed t/o). MOD A to exit L side of bed, MOD A + single UE support for static sitting EOB - L lateral lean noted. MOD A sit<>stand at elevated bed height and 2 side steps at EOB. MAX A to return to supine. MIN A to scoot higher in trended bed. Pt is eager to return to his PLOF with increased independence stating "I'll do whatever it takes". Pt educated on safe positioning of LUE for joint protection and to promote skin integrity on this date. Pt would benefit from skilled OT to address noted impairments and functional limitations (see below for any additional details) in order to maximize safety and independence while minimizing falls risk and caregiver burden.  Upon hospital discharge, recommend pt discharge to CIR to maximize safety and return to PLOF.    Follow Up Recommendations  CIR    Equipment Recommendations  3 in 1 bedside commode;Other (comment) (TBD at next venue of care)    Recommendations for Other Services       Precautions / Restrictions Precautions Precautions: Fall Restrictions Weight Bearing Restrictions: No Other Position/Activity Restrictions: LUE flaccid      Mobility Bed Mobility Overal bed mobility: Needs Assistance Bed Mobility: Supine to Sit;Sit to Supine   Supine to sit: Mod assist;HOB elevated Sit to supine: Max assist   General bed mobility comments: VCs for joint protection strategies  Transfers Overall transfer level: Needs assistance Equipment used: 1 person hand held assist Transfers: Sit to/from Stand Sit to Stand: Mod assist;From elevated surface         General transfer comment: MOD A + VCs for 2 side steps at EOB  Balance Overall balance assessment: Needs assistance Sitting-balance support: Single extremity supported;Feet supported Sitting balance-Leahy Scale: Poor Sitting balance - Comments: MOD A static sitting balance 2/2 poor trunk control Postural control: Left  lateral lean Standing balance support: Bilateral upper extremity supported Standing balance-Leahy Scale: Poor Standing balance comment: MOD A static standing balance       ADL either performed or assessed with clinical judgement   ADL Overall ADL's : Needs assistance/impaired      General ADL Comments: SETUP self-drinking at bed level. SETUP + MIN A self-feeding at bed level - TOTAL A to complete (pt spills 2/2 tremor and requests wife's assisstance for thoroughness, reports difficulty seeing utensils and keeps eyes closed t/o).      Vision Baseline Vision/History: Wears glasses Patient Visual Report: Eye fatigue/eye pain/headache Vision Assessment?: Vision impaired- to be further tested in functional context Additional Comments: Pt reports difficulty seeing spoon to self-feed, accurately states # of fingers in front of him. Impaired H test, pt reports eye fatigue      Pertinent Vitals/Pain Pain Assessment: Faces Faces Pain Scale: Hurts a little bit Pain Location: headache Pain Descriptors / Indicators: Headache Pain Intervention(s): Limited activity within patient's tolerance;Repositioned     Hand Dominance Right   Extremity/Trunk Assessment Upper Extremity Assessment Upper Extremity Assessment: LUE deficits/detail;RUE deficits/detail RUE Deficits / Details: Tremor noted LUE Deficits / Details: 0/5 t/o LUE. Sensation grossly intact to light pressure   Lower Extremity Assessment Lower Extremity Assessment: Generalized weakness LLE Deficits / Details: Patient positioned with L LE off edge of bed upon arrival. Able to lift straight leg raise from bed and at least 4/5 ankle MMT for PF and DF. However, patient appears unable to keep L LE in hooklying and does not push with that leg when scooting up in bed.   Cervical / Trunk Assessment Cervical / Trunk Assessment: Other exceptions Cervical / Trunk Exceptions: left lateral lean   Communication Communication Communication: No  difficulties   Cognition Arousal/Alertness: Lethargic Behavior During Therapy: Flat affect Overall Cognitive Status: Impaired/Different from baseline Area of Impairment: Orientation;Following commands            Orientation Level: Disoriented to;Time;Situation     Following Commands: Follows one step commands with increased time       General Comments: Wife, [name] states he did not have these cognitive difficulties until now   Exercises Exercises: Other exercises Other Exercises Other Exercises: Pt and family educated re: OT role, d/c recs, joint protection strategies, falls prevention, neuro re-education, stroke education Other Exercises: LBD, self-drinking/feeding, sup<>sit, sit<>stand, sitting/standing balance/tolerance   Shoulder Instructions      Home Living Family/patient expects to be discharged to:: Private residence Living Arrangements: Spouse/significant other Available Help at Discharge: Family Type of Home: House Home Access: Stairs to enter Technical brewer of Steps: 3 Entrance Stairs-Rails: Can reach both Home Layout: One level     Bathroom Shower/Tub: Teacher, early years/pre: Standard Bathroom Accessibility: No   Home Equipment: Cane - quad   Additional Comments: cannot find RW      Prior Functioning/Environment Level of Independence: Independent  Gait / Transfers Assistance Needed: ambulated I with no AD prior to 1 week ago ADL's / Homemaking Assistance Needed: independent with ADLs, did some meal prep. required help with IADLs including driving            OT Problem List: Decreased strength;Decreased activity tolerance;Impaired balance (sitting and/or standing);Impaired vision/perception;Impaired UE functional use;Decreased knowledge of use of DME or AE;Decreased  safety awareness;Decreased cognition      OT Treatment/Interventions: Self-care/ADL training;Therapeutic exercise;Energy conservation;DME and/or AE  instruction;Neuromuscular education;Therapeutic activities;Cognitive remediation/compensation;Visual/perceptual remediation/compensation;Patient/family education;Balance training    OT Goals(Current goals can be found in the care plan section) Acute Rehab OT Goals Patient Stated Goal: To return to PLOF OT Goal Formulation: With patient/family Time For Goal Achievement: 04/22/20 Potential to Achieve Goals: Fair ADL Goals Pt Will Perform Eating: with min assist;with caregiver independent in assisting;bed level Pt Will Perform Upper Body Dressing: sitting;with min assist Pt Will Transfer to Toilet: with min assist;stand pivot transfer;bedside commode (c LRAD PRN)  OT Frequency: Min 2X/week   Barriers to D/C: Inaccessible home environment;Decreased caregiver support          AM-PAC OT "6 Clicks" Daily Activity     Outcome Measure Help from another person eating meals?: A Little Help from another person taking care of personal grooming?: A Lot Help from another person toileting, which includes using toliet, bedpan, or urinal?: A Lot Help from another person bathing (including washing, rinsing, drying)?: A Lot Help from another person to put on and taking off regular upper body clothing?: A Lot Help from another person to put on and taking off regular lower body clothing?: A Lot 6 Click Score: 13   End of Session Nurse Communication: Mobility status  Activity Tolerance: Patient tolerated treatment well Patient left: in bed;with call bell/phone within reach;with bed alarm set;with family/visitor present  OT Visit Diagnosis: Other abnormalities of gait and mobility (R26.89);Muscle weakness (generalized) (M62.81);Hemiplegia and hemiparesis Hemiplegia - Right/Left: Left Hemiplegia - dominant/non-dominant: Non-Dominant Hemiplegia - caused by: Cerebral infarction                Time: 1610-9604 OT Time Calculation (min): 34 min Charges:  OT General Charges $OT Visit: 1 Visit OT  Evaluation $OT Eval Moderate Complexity: 1 Mod OT Treatments $Self Care/Home Management : 8-22 mins $Therapeutic Activity: 8-22 mins  Dessie Coma, M.S. OTR/L  04/08/20, 1:52 PM  ascom [phone]

## 2020-04-09 ENCOUNTER — Inpatient Hospital Stay: Payer: Medicare HMO

## 2020-04-09 LAB — AMMONIA: Ammonia: 15 umol/L (ref 9–35)

## 2020-04-09 LAB — LIPID PANEL
Cholesterol: 123 mg/dL (ref 0–200)
HDL: 54 mg/dL (ref >40)
LDL Cholesterol: 53 mg/dL (ref 0–99)
Total CHOL/HDL Ratio: 2.3 RATIO
Triglycerides: 79 mg/dL (ref <150)
VLDL: 16 mg/dL (ref 0–40)

## 2020-04-09 LAB — BASIC METABOLIC PANEL
Anion gap: 9 (ref 5–15)
BUN: 28 mg/dL — ABNORMAL HIGH (ref 8–23)
CO2: 23 mmol/L (ref 22–32)
Calcium: 8 mg/dL — ABNORMAL LOW (ref 8.9–10.3)
Chloride: 102 mmol/L (ref 98–111)
Creatinine, Ser: 1.56 mg/dL — ABNORMAL HIGH (ref 0.61–1.24)
GFR calc Af Amer: 48 mL/min — ABNORMAL LOW (ref >60)
GFR calc non Af Amer: 41 mL/min — ABNORMAL LOW (ref >60)
Glucose, Bld: 129 mg/dL — ABNORMAL HIGH (ref 70–99)
Potassium: 4 mmol/L (ref 3.5–5.1)
Sodium: 134 mmol/L — ABNORMAL LOW (ref 135–145)

## 2020-04-09 LAB — URINALYSIS, COMPLETE (UACMP) WITH MICROSCOPIC
Bilirubin Urine: NEGATIVE
Glucose, UA: NEGATIVE mg/dL
Ketones, ur: NEGATIVE mg/dL
Nitrite: NEGATIVE
Protein, ur: 100 mg/dL — AB
Specific Gravity, Urine: 1.02 (ref 1.005–1.030)
pH: 5 (ref 5.0–8.0)

## 2020-04-09 LAB — HEMOGLOBIN A1C
Hgb A1c MFr Bld: 5.4 % (ref 4.8–5.6)
Mean Plasma Glucose: 108.28 mg/dL

## 2020-04-09 MED ORDER — HALOPERIDOL LACTATE 5 MG/ML IJ SOLN
1.0000 mg | Freq: Once | INTRAMUSCULAR | Status: AC
  Administered 2020-04-09: 1 mg via INTRAVENOUS
  Filled 2020-04-09: qty 1

## 2020-04-09 MED ORDER — LORAZEPAM 2 MG/ML IJ SOLN
1.0000 mg | Freq: Once | INTRAMUSCULAR | Status: AC
  Administered 2020-04-09: 1 mg via INTRAVENOUS
  Filled 2020-04-09: qty 1

## 2020-04-09 MED ORDER — APIXABAN 2.5 MG PO TABS: 2.5000 mg | ORAL_TABLET | Freq: Two times a day (BID) | ORAL | Status: DC

## 2020-04-09 MED ORDER — LORAZEPAM 2 MG/ML IJ SOLN
0.5000 mg | INTRAMUSCULAR | Status: DC | PRN
  Administered 2020-04-09: 0.5 mg via INTRAMUSCULAR
  Filled 2020-04-09: qty 1
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
## 2020-04-09 NOTE — Progress Notes (Signed)
Patient's oxygen saturation went down to 83%, patient placed on 2L [place], Dr. Kurtis Bushman also aware.

## 2020-04-09 NOTE — Consult Note (Signed)
[facility] Cardiology  CARDIOLOGY CONSULT NOTE  Patient ID: Joseph Houston MRN: [id number] DOB/AGE: [birth year] 82 y.o.  Admit date: 04/30/2020 Referring Physician [place] Primary Physician [facility] Primary Cardiologist Fath Reason for Consultation atrial fibrillation and coronary artery disease  HPI: 84 year old gentleman referred for evaluation for management of anticoagulation for coronary artery disease and atrial fibrillation.  Patient was admitted 05/04/2020 with urinary tract infection, anasarca, acute kidney injury and ascites.  Patient has a history of high-grade urothelial carcinoma of the bladder status post chemotherapy and radiation therapy.  The patient underwent paracentesis, and Eliquis was held 1-2 doses.  She has a history of atrial fibrillation on Eliquis for stroke prevention.  On 04/07/2020, the patient developed left upper extremity weakness, CT scan was unremarkable.  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  The patient is status post inferior STEMI, Resolute Onyx drug-eluting stent proximal RCA 10/06/2019, on aspirin and clopidogrel.  Review of systems complete and found to be negative unless listed above     Past Medical History:  Diagnosis Date  . Anemia    was treated in the past, not now  . Anxiety   . Arthritis    knees  . BPH (benign prostatic hypertrophy)   . Chronic kidney disease    stage 3 ckd  . COPD (chronic obstructive pulmonary disease) ([place])    has had most of his life. has not used an inhaler for years  . Coronary artery disease   . Difficult intubation    pt reports bad sore throat after surgery. pt unsure of this response  . Dyspnea   . Dysrhythmia 2016   atrial fibrillation, bradycardia  . GERD (gastroesophageal reflux disease)   . Headache   . History of hiatal hernia   . History of kidney stones 04/2018  . Hypertension   . Pneumonia   . Pre-diabetes    dr. Ginette Pitman following  . Presence of  permanent cardiac pacemaker 2017  . Psoriasis 2019   elbows  . Restless leg   . STEMI involving right coronary artery ([place]) 10/07/2019  . Vertigo 1992  . Yeast infection    in groin for 50 years    Past Surgical History:  Procedure Laterality Date  . CATARACT EXTRACTION W/PHACO Right 09/25/2016   Procedure: CATARACT EXTRACTION PHACO AND INTRAOCULAR LENS PLACEMENT (IOC);  Surgeon: Estill Cotta, MD;  Location: [facility] ORS;  Service: Ophthalmology;  Laterality: Right;  [place] 01:58 AP% 18.1 CDE 43.36 Fluid pack # [id number]  . CORONARY STENT INTERVENTION N/A 10/06/2019   Procedure: CORONARY STENT INTERVENTION;  Surgeon: Yolonda Kida, MD;  Location: [place] [facility];  Service: Cardiovascular;  Laterality: N/A;  MID RCA  . CORONARY/GRAFT ACUTE MI REVASCULARIZATION N/A 10/06/2019   Procedure: Coronary/Graft Acute MI Revascularization;  Surgeon: Yolonda Kida, MD;  Location: [place] [facility];  Service: Cardiovascular;  Laterality: N/A;  . CYSTOSCOPY N/A 09/28/2019   Procedure: CYSTOSCOPY;  Surgeon: Abbie Sons, MD;  Location: [facility] ORS;  Service: Urology;  Laterality: N/A;  . CYSTOSCOPY/URETEROSCOPY/HOLMIUM LASER/STENT PLACEMENT Right 04/13/2018   Procedure: CYSTOSCOPY/URETEROSCOPY/HOLMIUM LASER/STENT PLACEMENT;  Surgeon: Hollice Espy, MD;  Location: [facility] OR[facility];  Service: Urology;  Laterality: Right;  . ELECTROPHYSIOLOGIC STUDY N/A 12/22/2014   Procedure: CARDIOVERSION;  Surgeon: Teodoro Spray, MD;  Location: [facility] ORS;  Service: Cardiovascular;  Laterality: N/A;  . FINGER SURGERY Right 1950   index finger cut off half way  . HERNIA REPAIR Bilateral  inguinal repaired x 4  . INSERT / REPLACE / REMOVE PACEMAKER     12/17  . JOINT REPLACEMENT Right 2012   partial knee replacement  . LEFT HEART CATH AND CORONARY ANGIOGRAPHY N/A 10/06/2019   Procedure: LEFT HEART CATH AND CORONARY ANGIOGRAPHY;  Surgeon: Yolonda Kida, MD;  Location: [place] [facility];  Service:  Cardiovascular;  Laterality: N/A;  . PACEMAKER INSERTION Left 07/17/2016   Procedure: INSERTION PACEMAKER;  Surgeon: Isaias Cowman, MD;  Location: [facility] OR[facility];  Service: Cardiovascular;  Laterality: Left;  . PARTIAL KNEE ARTHROPLASTY Right 2012  . TONSILLECTOMY  1950  . TOTAL KNEE ARTHROPLASTY Left 09/30/2017   Procedure: TOTAL KNEE ARTHROPLASTY;  Surgeon: Hessie Knows, MD;  Location: [facility];  Service: Orthopedics;  Laterality: Left;  . TRANSURETHRAL RESECTION OF BLADDER TUMOR N/A 09/28/2019   Procedure: TRANSURETHRAL RESECTION OF BLADDER TUMOR (TURBT);  Surgeon: Abbie Sons, MD;  Location: [facility] OR[facility];  Service: Urology;  Laterality: N/A;  . TRANSURETHRAL RESECTION OF PROSTATE      Medications Prior to Admission  Medication Sig Dispense Refill Last Dose  . acetaminophen (TYLENOL) 650 MG CR tablet Take 1,300 mg by mouth every 8 (eight) hours as needed for pain.   Unknown at PRN  . amiodarone (PACERONE) 200 MG tablet Take 1 tablet (200 mg total) by mouth daily. 30 tablet 0 04/05/2020 at 0900  . amLODipine (NORVASC) 2.5 MG tablet Take 5 mg by mouth daily.   04/05/2020 at 0900  . aspirin EC 81 MG tablet Take 81 mg by mouth daily.     [name] atorvastatin (LIPITOR) 80 MG tablet Take 1 tablet (80 mg total) by mouth daily at 6 PM. 30 tablet 0 04/04/2020 at 1800  . clopidogrel (PLAVIX) 75 MG tablet Take 1 tablet (75 mg total) by mouth daily. 30 tablet 0 04/05/2020 at 0900  . cyanocobalamin 1000 MCG tablet Take 1,000 mcg by mouth daily.      [name] enoxaparin (LOVENOX) 80 MG/0.8ML injection Inject 0.8 mg into the skin every 12 (twelve) hours.   04/05/2020 at 0900  . losartan (COZAAR) 50 MG tablet Take 50 mg by mouth at bedtime.    04/04/2020 at 2100  . memantine (NAMENDA) 10 MG tablet Take 10 mg by mouth 2 (two) times daily.    04/05/2020 at 0900  . metoprolol tartrate (LOPRESSOR) 50 MG tablet Take 1 tablet (50 mg total) by mouth 2 (two) times daily. 60 tablet 0 04/05/2020 at 0900  . Omega-3 Fatty Acids (FISH OIL) 1000  MG CAPS Take 1 capsule by mouth 2 (two) times daily.      [name] omeprazole (PRILOSEC) 20 MG capsule Take 20 mg by mouth 2 (two) times daily before a meal.    04/05/2020 at 0700  . phenazopyridine (PYRIDIUM) 200 MG tablet Take 200 mg by mouth at bedtime.     . prochlorperazine (COMPAZINE) 10 MG tablet Take 10 mg by mouth every 6 (six) hours as needed for nausea/vomiting.      Social History   Socioeconomic History  . Marital status: Married    Spouse name: [place]  . Number of children: 4  . Years of education: Not on file  . Highest education level: Not on file  Occupational History  . 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 Smoking status: Former Smoker    Packs/day: 2.00    Types: Cigarettes    Quit date: 12/21/1965    Years since quitting:  54.3  . Smokeless tobacco: Never Used  Vaping Use  . Vaping Use: Never used  Substance and Sexual Activity  . Alcohol use: No    Comment: last drink 1964  . Drug use: No  . Sexual activity: Not Currently  Other Topics Concern  . Not on file  Social History Narrative  . Not on file   Social Determinants of Health   Financial Resource Strain:   . Difficulty of Paying Living Expenses: Not on file  Food Insecurity:   . Worried About Charity fundraiser in the Last Year: Not on file  . Ran Out of Food in the Last Year: Not on file  Transportation Needs:   . Lack of Transportation (Medical): Not on file  . Lack of Transportation (Non-Medical): Not on file  Physical Activity:   . Days of Exercise per Week: Not on file  . Minutes of Exercise per Session: Not on file  Stress:   . Feeling of Stress : Not on file  Social Connections:   . Frequency of Communication with Friends and Family: Not on file  . Frequency of Social Gatherings with Friends and Family: Not on file  . Attends Religious Services: Not on file  . Active Member of Clubs or Organizations: Not on file  . Attends Archivist  Meetings: Not on file  . Marital Status: Not on file  Intimate Partner Violence:   . Fear of Current or Ex-Partner: Not on file  . Emotionally Abused: Not on file  . Physically Abused: Not on file  . Sexually Abused: Not on file    History reviewed. No pertinent family history.    Review of systems complete and found to be negative unless listed above      PHYSICAL EXAM  General: Well developed, well nourished, in no acute distress HEENT:  Normocephalic and atramatic Neck:  No JVD.  Lungs: Clear bilaterally to auscultation and percussion. Heart: HRRR . Normal S1 and S2 without gallops or murmurs.  Abdomen: Bowel sounds are positive, abdomen soft and non-tender  Msk:  Back normal, normal gait. Normal strength and tone for age. Extremities: No clubbing, cyanosis or edema.   Neuro: Patient nonconversant Psych: Patient nonconversant  Labs:   Lab Results  Component Value Date   WBC 8.0 04/08/2020   HGB 12.3 (L) 04/08/2020   HCT 37.5 (L) 04/08/2020   MCV 93.5 04/08/2020   PLT 175 04/08/2020    Recent Labs  Lab 04/09/2020 0158 04/07/20 0646 04/09/20 0805  NA  --    < > 134*  K  --    < > 4.0  CL  --    < > 102  CO2  --    < > 23  BUN  --    < > 28*  CREATININE  --    < > 1.56*  CALCIUM  --    < > 8.0*  PROT 5.7*  --   --   BILITOT 0.8  --   --   ALKPHOS 44  --   --   ALT 18  --   --   AST 23  --   --   GLUCOSE  --    < > 129*   < > = values in this interval not displayed.   Lab Results  Component Value Date   TROPONINI <0.03 12/01/2015    Lab Results  Component Value Date   CHOL 123 04/09/2020   Lab Results  Component Value Date   HDL 54 04/09/2020   Lab Results  Component Value Date   LDLCALC 53 04/09/2020   Lab Results  Component Value Date   TRIG 79 04/09/2020   Lab Results  Component Value Date   CHOLHDL 2.3 04/09/2020   No results found for: LDLDIRECT    Radiology: CT ABDOMEN PELVIS WO CONTRAST  Result Date: 04/21/2020 CLINICAL DATA:   Abdominal distension and nonlocalized abdominal pain. EXAM: CT ABDOMEN AND PELVIS WITHOUT CONTRAST TECHNIQUE: Multidetector CT imaging of the abdomen and pelvis was performed following the standard protocol without IV contrast. COMPARISON:  09/06/2019 and abdominal CT report from [facility] 01/14/2020 FINDINGS: Lower chest: Generous heart size. Dual-chamber pacer leads. Small pleural effusions on the left more than right with calcified pleural plaques. Hepatobiliary: Small appearance of the liver with some surface lobulation and caudate/fissure enlargement.Mild high-density at the dependent gallbladder without calcified stone or acute inflammatory finding Pancreas: Generalized fatty atrophy. Spleen: Unremarkable. Adrenals/Urinary Tract: Negative adrenals. Cystic densities on both sides of the renal cortex. Anterior and right lateral asymmetric bladder wall thickening. Although imperceptible on axial slices there is an intact posterior bladder wall by reformats Stomach/Bowel: No obstruction. No visible bowel inflammation. Proctitis by recent sigmoidoscopy, unremarkable by CT Vascular/Lymphatic: Multifocal atheromatous calcification. No mass or adenopathy. Reproductive:Mildly enlarged prostate. Other: Large volume ascites. There is reticulation of the greater omentum without nodularity or discrete masslike finding. Bilateral inguinal hernia repair. Musculoskeletal: No acute abnormalities. Lumbar spine degeneration, focally advanced at L4-5. IMPRESSION: 1. Large volume ascites which has seemingly increased from a outside CT report June 2021. There could be underlying liver cirrhosis. At sampling, consider including cytology given history of bladder cancer. 2. Small pleural effusions. 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 Electronically Signed   By: Monte Fantasia M.D.   On: 04/05/2020 05:42   CT HEAD WO CONTRAST  Result Date: 04/08/2020 CLINICAL DATA:  Stroke follow-up EXAM: CT  HEAD WITHOUT CONTRAST TECHNIQUE: Contiguous axial images were obtained from the base of the skull through the vertex without intravenous contrast. COMPARISON:  Yesterday FINDINGS: Brain: Moderate posterior right frontal infarct with cytotoxic edema obscuring the cortex. There is also a small recent appearing infarct in the right occipital cortex just above a pre-existing cortical infarct. Small right superior cerebellar infarction which is also become apparent since prior. No hemorrhage, hydrocephalus, or collection. Chronic small vessel ischemia in the cerebral white matter. Vascular: No hyperdense vessel.  Atherosclerotic calcification Skull: Normal. Negative for fracture or focal lesion. Sinuses/Orbits: Right cataract resection.  No acute finding IMPRESSION: 1. Moderate acute infarct at the right posterior frontal cortex. 2. Small recent right occipital cortex infarct adjacent to a more chronic infarct. 3. Small acute right superior cerebellar infarct. Electronically Signed   By: Monte Fantasia M.D.   On: 04/08/2020 09:32   CT HEAD WO CONTRAST  Result Date: 04/07/2020 CLINICAL DATA:  Neuro deficit, acute, stroke suspected; new left arm weakness. EXAM: CT HEAD WITHOUT CONTRAST TECHNIQUE: Contiguous axial images were obtained from the base of the skull through the vertex without intravenous contrast. COMPARISON:  Prior head CT examinations 09/20/2015 and earlier. FINDINGS: Brain: Stable, moderate generalized parenchymal atrophy. Redemonstrated chronic small-vessel infarct within the posterior left centrum semiovale/corona radiata (series 4, image 39). Progressive background moderate ill-defined hypoattenuation within the cerebral white matter is nonspecific, but consistent with chronic small vessel ischemic disease. No acute demarcated cortical infarct is identified. A small chronic cortically based infarct is questioned  within the right occipital lobe. There is no acute intracranial hemorrhage. No extra-axial  fluid collection. No evidence of intracranial mass. No midline shift. Vascular: No hyperdense vessel.  Atherosclerotic calcifications Skull: Normal. Negative for fracture or focal lesion. Sinuses/Orbits: Visualized orbits show no acute finding. Mild ethmoid sinus mucosal thickening. No significant mastoid effusion. IMPRESSION: No CT evidence of acute intracranial abnormality. A small chronic cortically based infarct is questioned within the right occipital lobe. Progressive moderate chronic small vessel ischemic disease. Stable, moderate generalized parenchymal atrophy. Mild ethmoid sinus mucosal thickening. Electronically Signed   By: Kellie Simmering DO   On: 04/07/2020 09:23   US Carotid Bilateral  Result Date: 04/08/2020 CLINICAL DATA:  Stroke-like symptoms EXAM: BILATERAL CAROTID DUPLEX ULTRASOUND TECHNIQUE: Pearline Cables scale imaging, color Doppler and duplex ultrasound were performed of bilateral carotid and vertebral arteries in the neck. COMPARISON:  Prior duplex carotid ultrasound 10/15/2019 FINDINGS: Criteria: Quantification of carotid stenosis is based on velocity parameters that correlate the residual internal carotid diameter with NASCET-based stenosis levels, using the diameter of the distal internal carotid lumen as the denominator for stenosis measurement. The following velocity measurements were obtained: RIGHT ICA: 142/14 cm/sec CCA: 47/4 cm/sec SYSTOLIC ICA/CCA RATIO:  1.9 ECA:  75 cm/sec LEFT ICA: 69/9 cm/sec CCA: 25/9 cm/sec SYSTOLIC ICA/CCA RATIO:  1.2 ECA:  70 cm/sec RIGHT CAROTID ARTERY: Focal heterogeneous atherosclerotic plaque in the proximal internal carotid artery. By peak systolic velocity criteria, the estimated stenosis falls in the 50-69% diameter range. RIGHT VERTEBRAL ARTERY:  Patent with normal antegrade flow. LEFT CAROTID ARTERY: No significant atherosclerotic plaque or evidence of stenosis in the internal carotid artery. LEFT VERTEBRAL ARTERY:  Patent with normal antegrade flow.  IMPRESSION: 1. Moderate (50-69%) stenosis proximal right internal carotid artery secondary to heterogenous atherosclerotic plaque. 2. No significant atherosclerotic plaque or evidence of stenosis in the left internal carotid artery. 3. Vertebral arteries remain patent with normal antegrade flow. Signed, Criselda Peaches, MD, [facility] Radiology Electronically Signed   By: Jacqulynn Cadet M.D.   On: 04/08/2020 11:12   US Paracentesis  Result Date: 04/07/2020 EXAM: ULTRASOUND GUIDED  PARACENTESIS MEDICATIONS: None. COMPLICATIONS: None immediate. PROCEDURE: Informed written consent was obtained from the patient after a discussion of the risks, benefits and alternatives to treatment. A timeout was performed prior to the initiation of the procedure. Initial ultrasound scanning demonstrates a large amount of ascites within the right lower abdominal quadrant. The right lower abdomen was prepped and draped in the usual sterile fashion. 1% lidocaine was used for local anesthesia. Following this, a Yueh catheter was introduced. An ultrasound image was saved for documentation purposes. The paracentesis was performed. The catheter was removed and a dressing was applied. The patient tolerated the procedure well without immediate post procedural complication. Patient received post-procedure intravenous albumin; see nursing notes for details. FINDINGS: A total of approximately 5,270 mL of cloudy fluid was removed. Samples were sent to the laboratory as requested by the clinical team. IMPRESSION: Successful ultrasound-guided paracentesis yielding 5270 mL of peritoneal fluid. Electronically Signed   By: Marcello Moores  Register   On: 04/07/2020 05:10   ECHOCARDIOGRAM COMPLETE  Result Date: 04/10/2020    ECHOCARDIOGRAM REPORT   Patient Name:   Joseph Houston Date of Exam: 04/29/2020 Medical Rec #:  [id number]         Height:       71.0 in Accession #:    [id number]         Weight:  224.0 lb Date of Birth:  1935-11-04        BSA:          2.213 m Patient Age:    52 years          BP:           125/78 mmHg Patient Gender: M                 HR:           64 bpm. Exam Location:  [facility] Procedure: 2D Echo, Cardiac Doppler and Color Doppler Indications:     CHF- acute diastolic 884.16  History:         Patient has no prior history of Echocardiogram examinations.                  COPD; Risk Factors:Hypertension. STEMI.  Sonographer:     Sherrie Sport RDCS (AE) Referring Phys:  [id number] Royce Macadamia AGBATA Diagnosing Phys: Isaias Cowman MD IMPRESSIONS  1. Left ventricular ejection fraction, by estimation, is 50 to 55%. The left ventricle has low normal function. The left ventricle has no regional wall motion abnormalities. Left ventricular diastolic parameters are indeterminate.  2. Right ventricular systolic function is normal. The right ventricular size is normal. There is mildly elevated pulmonary artery systolic pressure.  3. Left atrial size was mild to moderately dilated.  4. The mitral valve is normal in structure. Mild mitral valve regurgitation. No evidence of mitral stenosis.  5. The aortic valve is normal in structure. Aortic valve regurgitation is mild. No aortic stenosis is present.  6. The inferior vena cava is normal in size with greater than 50% respiratory variability, suggesting right atrial pressure of 3 mmHg. FINDINGS  Left Ventricle: Left ventricular ejection fraction, by estimation, is 50 to 55%. The left ventricle has low normal function. The left ventricle has no regional wall motion abnormalities. The left ventricular internal cavity size was normal in size. There is no left ventricular hypertrophy. Left ventricular diastolic parameters are indeterminate. Right Ventricle: The right ventricular size is normal. No increase in right ventricular wall thickness. Right ventricular systolic function is normal. There is mildly elevated pulmonary artery systolic pressure. The  tricuspid regurgitant velocity is 2.93  m/s, and with an assumed right atrial pressure of 10 mmHg, the estimated right ventricular systolic pressure is 60.1 mmHg. Left Atrium: Left atrial size was mild to moderately dilated. Right Atrium: Right atrial size was normal in size. Pericardium: There is no evidence of pericardial effusion. Mitral Valve: The mitral valve is normal in structure. Normal mobility of the mitral valve leaflets. Mild mitral valve regurgitation. No evidence of mitral valve stenosis. Tricuspid Valve: The tricuspid valve is normal in structure. Tricuspid valve regurgitation is mild . No evidence of tricuspid stenosis. Aortic Valve: The aortic valve is normal in structure. Aortic valve regurgitation is mild. No aortic stenosis is present. Aortic valve mean gradient measures 6.5 mmHg. Aortic valve peak gradient measures 11.0 mmHg. Aortic valve area, by VTI measures 2.01  cm. Pulmonic Valve: The pulmonic valve was normal in structure. Pulmonic valve regurgitation is not visualized. No evidence of pulmonic stenosis. Aorta: The aortic root is normal in size and structure. Venous: The inferior vena cava is normal in size with greater than 50% respiratory variability, suggesting right atrial pressure of 3 mmHg. IAS/Shunts: No atrial level shunt detected by color flow Doppler. Additional Comments: A pacer wire is visualized.  LEFT VENTRICLE PLAX 2D LVIDd:  3.89 cm LVIDs:         2.59 cm LV PW:         2.02 cm LV IVS:        1.67 cm LVOT diam:     2.00 cm LV SV:         62 LV SV Index:   28 LVOT Area:     3.14 cm  RIGHT VENTRICLE RV Basal diam:  4.87 cm RV S prime:     13.70 cm/s TAPSE (M-mode): 3.2 cm LEFT ATRIUM             Index       RIGHT ATRIUM           Index LA diam:        6.10 cm 2.76 cm/m  RA Area:     18.20 cm LA Vol (A2C):   77.5 ml 35.03 ml/m RA Volume:   41.90 ml  18.94 ml/m LA Vol (A4C):   99.8 ml 45.11 ml/m LA Biplane Vol: 88.9 ml 40.18 ml/m  AORTIC VALVE                     PULMONIC VALVE AV Area (Vmax):    1.89 cm     PV Vmax:        0.60 m/s AV Area (Vmean):   1.93 cm     PV Peak grad:   1.4 mmHg AV Area (VTI):     2.01 cm     RVOT Peak grad: 3 mmHg AV Vmax:           165.50 cm/s AV Vmean:          116.000 cm/s AV VTI:            0.308 m AV Peak Grad:      11.0 mmHg AV Mean Grad:      6.5 mmHg LVOT Vmax:         99.80 cm/s LVOT Vmean:        71.100 cm/s LVOT VTI:          0.197 m LVOT/AV VTI ratio: 0.64  AORTA Ao Root diam: 3.60 cm MITRAL VALVE               TRICUSPID VALVE MV Area (PHT): 3.06 cm    TR Peak grad:   34.3 mmHg MV Decel Time: 248 msec    TR Vmax:        293.00 cm/s MV E velocity: 82.00 cm/s                            SHUNTS                            Systemic VTI:  0.20 m                            Systemic Diam: 2.00 cm Isaias Cowman MD Electronically signed by Isaias Cowman MD Signature Date/Time: 04/20/2020/1:33:29 PM    Final     EKG: Atrial fibrillation at a rate of 75 bpm with unifocal PVCs  ASSESSMENT AND PLAN:   1.  Embolic CVA, manifested with left upper extremity weakness, patient now nonconversant, felt to be due to holding Eliquis for paracentesis 2.  Chronic atrial fibrillation, on Eliquis for stroke prevention 3.  History of inferior STEMI, status  post drug-eluting stent proximal RCA 10/06/2019 4.  High-grade urothelial bladder cancer status post chemotherapy and radiation therapy 5.  Chronic kidney disease 6.  Anasarca / ascites, status post paracentesis   Recommendations  1.  Agree with overall current therapy 2.  Resume Eliquis 2.5 mg twice daily  (older than 82 years old, creatinine greater than 1.5 ) 3.  Resume clopidogrel 75 mg daily 4.  Hold aspirin 5.  No further cardiac diagnostics at this time.  No clear indication for transesophageal echocardiogram since results would not impact or change current coagulation scheme, and the patient currently is nonconversant and confused would be noncompliant with  procedure  Signed: Isaias Cowman MD,PhD, [facility] 04/09/2020, 11:31 AM

## 2020-04-09 NOTE — Progress Notes (Signed)
PROGRESS NOTE    EDRIC FETTERMAN  XFG:[id number] DOB: 03-27-1936 DOA: 04/08/2020 PCP: Tracie Harrier, MD    Brief Narrative:  Joseph Houston is a 84 y.o. male with medical history significant for high-grade urothelial carcinoma of the bladder status post chemo and radiation therapy, history of A. fib on anticoagulation, history of coronary artery disease, hypertension, chronic kidney disease stage III who presents to the emergency room for evaluation of generalized weakness which has progressively worsened over the last couple of weeks and now patient has to use a cane for ambulation to reduce his risk for falls because he is unsteady on his feet.  He complains of abdominal pain which is diffuse but mostly in the periumbilical area associated with nausea and vomiting but denies having any changes in his bowel habits.  He has shortness of breath with exertion but denies having any chest pain, no cough, no fever, no chills.  He also stated that he has not voided in the last 12 hours and a bladder scan revealed 900 cc of urine. Labs revealed sodium 133, potassium 4.1, chloride 102, bicarb 20, BUN 29, creatinine 2.1 compared to baseline of 1.36, AST 23, ALT 18, lactic acid 1.5, hemoglobin 12.9, hematocrit 39.7, platelet count 298. CT scan of abdomen and pelvis showed large volume ascites which has seemingly increased from an outside CT report June 2021. There could be underlying liver cirrhosis.     Consultants:   Neurology, nephrology  Procedures: CT, status post paracentesis  Antimicrobials:   Ceftriaxone   Subjective: Confused. Per nsg was thrashing . For me was confused, and somnolent. Not responding to questions or participating in exam basically was sleepy.  Was given medication earlier.  Objective: Vitals:   04/09/20 0458 04/09/20 0544 04/09/20 0938 04/09/20 1335  BP: 123/85  (!) 106/59 105/61  Pulse: 96 91 91 85  Resp: 20  12 18   Temp: 97.6 F (36.4 C)  97.6 F (36.4 C) 98.2  F (36.8 C)  TempSrc:   Oral Oral  SpO2:  99% (!) 89% 98%  Weight:      Height:        Intake/Output Summary (Last 24 hours) at 04/09/2020 1756 Last data filed at 04/09/2020 1600 Gross per 24 hour  Intake 540.27 ml  Output 500 ml  Net 40.27 ml   Filed Weights   04/05/20 1301 04/07/20 1435  Weight: 101.6 kg 95.5 kg    Examination: Confused, nad.  Sleepy not participating with exam eyes closed Neuro check unable to perform Anteriorly clear to auscultation no wheezing Reg-irreg, s1/s2 no murmurs Soft nt, mild distention but no change from yesteday .+bs +pitting edema.  Data Reviewed: I have personally reviewed following labs and imaging studies  CBC: Recent Labs  Lab 04/05/20 1305 04/07/20 0646 04/08/20 0428  WBC 6.4 6.9 8.0  HGB 12.9* 12.6* 12.3*  HCT 39.7 38.3* 37.5*  MCV 93.9 93.0 93.5  PLT 298 229 967   Basic Metabolic Panel: Recent Labs  Lab 04/05/20 1305 04/07/20 0646 04/08/20 0428 04/09/20 0805  NA 133* 136 134* 134*  K 4.1 4.0 3.7 4.0  CL 102 105 101 102  CO2 20* 21* 24 23  GLUCOSE 121* 95 142* 129*  BUN 39* 37* 31* 28*  CREATININE 2.11* 1.89* 1.69* 1.56*  CALCIUM 8.4* 8.5* 8.2* 8.0*   GFR: Estimated Creatinine Clearance: 43.8 mL/min (A) (by C-G formula based on SCr of 1.56 mg/dL (H)). Liver Function Tests: Recent Labs  Lab 04/26/2020 0158  AST 23  ALT 18  ALKPHOS 44  BILITOT 0.8  PROT 5.7*  ALBUMIN 2.7*   Recent Labs  Lab 04/12/2020 0158  LIPASE 27   Recent Labs  Lab 04/09/20 1228  AMMONIA 15   Coagulation Profile: No results for input(s): INR, PROTIME in the last 168 hours. Cardiac Enzymes: No results for input(s): CKTOTAL, CKMB, CKMBINDEX, TROPONINI in the last 168 hours. BNP (last 3 results) No results for input(s): PROBNP in the last 8760 hours. HbA1C: Recent Labs    04/09/20 0805  HGBA1C 5.4   CBG: No results for input(s): GLUCAP in the last 168 hours. Lipid Profile: Recent Labs    04/09/20 0805  CHOL 123  HDL 54    LDLCALC 53  TRIG 79  CHOLHDL 2.3   Thyroid Function Tests: No results for input(s): TSH, T4TOTAL, FREET4, T3FREE, THYROIDAB in the last 72 hours. Anemia Panel: No results for input(s): VITAMINB12, FOLATE, FERRITIN, TIBC, IRON, RETICCTPCT in the last 72 hours. Sepsis Labs: Recent Labs  Lab 04/10/2020 0200  LATICACIDVEN 1.5    Recent Results (from the past 240 hour(s))  Urine Culture     Status: Abnormal   Collection Time: 04/05/20  1:05 PM   Specimen: Urine, Clean Catch  Result Value Ref Range Status   Specimen Description   Final    URINE, CLEAN CATCH Performed at [facility], [address] [place], [place] [address]    Special Requests   Final    Normal Performed at [facility], [address] [place], [place] [address]    Culture (A)  Final    <10,000 COLONIES/mL INSIGNIFICANT GROWTH Performed at [facility], [place] [address] [place], [place] [address]    Report Status 04/30/2020 FINAL  Final  SARS Coronavirus 2 by RT PCR (hospital order, performed in [facility] hospital lab) Nasopharyngeal Nasopharyngeal Swab     Status: None   Collection Time: 04/12/2020  2:01 AM   Specimen: Nasopharyngeal Swab  Result Value Ref Range Status   SARS Coronavirus 2 NEGATIVE NEGATIVE Final    Comment: (NOTE) SARS-CoV-2 target nucleic acids are NOT DETECTED.  The SARS-CoV-2 RNA is generally detectable in upper and lower respiratory specimens during the acute phase of infection. The lowest concentration of SARS-CoV-2 viral copies this assay can detect is 250 copies / mL. A negative result does not preclude SARS-CoV-2 infection and should not be used as the sole basis for treatment or other patient management decisions.  A negative result may occur with improper specimen collection / handling, submission of specimen other than nasopharyngeal swab, presence of viral mutation(s) within the areas targeted by this assay, and inadequate number of viral  copies (<250 copies / mL). A negative result must be combined with clinical observations, patient history, and epidemiological information.  Fact Sheet for Patients:   StrictlyIdeas.no  Fact Sheet for Healthcare Providers: BankingDealers.co.za  This test is not yet approved or  cleared by the [place] FDA and has been authorized for detection and/or diagnosis of SARS-CoV-2 by FDA under an Emergency Use Authorization (EUA).  This EUA will remain in effect (meaning this test can be used) for the duration of the COVID-19 declaration under Section 564(b)(1) of the Act, 21 U.S.C. section 360bbb-3(b)(1), unless the authorization is terminated or revoked sooner.  Performed at [facility], [address], [place], [place] [address]   Body fluid culture     Status: None (Preliminary result)   Collection Time: 04/10/2020  4:35 PM  Specimen: PATH Cytology Peritoneal fluid  Result Value Ref Range Status   Specimen Description   Final    PERITONEAL Performed at [facility], [address] [place], [place] [address]    Special Requests   Final    NONE Performed at [facility], [address] [place], [place] [address]    Gram Stain   Final    FEW WBC PRESENT, PREDOMINANTLY MONONUCLEAR NO ORGANISMS SEEN    Culture   Final    NO GROWTH 3 DAYS Performed at [facility], [place] [address], [place], [place] [address]    Report Status PENDING  Incomplete  CULTURE, BLOOD (ROUTINE X 2) w Reflex to ID Panel     Status: None (Preliminary result)   Collection Time: 04/07/20  3:01 PM   Specimen: BLOOD  Result Value Ref Range Status   Specimen Description BLOOD RIGHT [facility]  Final   Special Requests   Final    BOTTLES DRAWN AEROBIC AND ANAEROBIC Blood Culture adequate volume   Culture   Final    NO GROWTH 2 DAYS Performed at [facility], [address] [place], [place] [address]    Report Status  PENDING  Incomplete  CULTURE, BLOOD (ROUTINE X 2) w Reflex to ID Panel     Status: None (Preliminary result)   Collection Time: 04/07/20  3:53 PM   Specimen: BLOOD  Result Value Ref Range Status   Specimen Description BLOOD RIGHT ANTECUBITAL  Final   Special Requests   Final    BOTTLES DRAWN AEROBIC AND ANAEROBIC Blood Culture adequate volume   Culture   Final    NO GROWTH 2 DAYS Performed at [facility], [address] [place], [place] [address]    Report Status PENDING  Incomplete         Radiology Studies: CT HEAD WO CONTRAST  Result Date: 04/09/2020 CLINICAL DATA:  Continue confusion.  Stroke follow-up. EXAM: CT HEAD WITHOUT CONTRAST TECHNIQUE: Contiguous axial images were obtained from the base of the skull through the vertex without intravenous contrast. COMPARISON:  Head CT, 04/08/2020 FINDINGS: Brain: Small area of right posterior frontal lobe infarction is unchanged from the previous day's exam. There is also a small area of hypoattenuation involving the gray-white junction of the posterior right occipital/parietal lobe. No other evidence of recent infarction. No hydrocephalus and no intracranial hemorrhage. Ventricles and sulci are enlarged reflecting mild diffuse atrophy. There are patchy areas of white matter hypoattenuation consistent with chronic microvascular ischemic change. Small old cerebellar infarcts. Vascular: No hyperdense vessel or unexpected calcification. Skull: Normal. Negative for fracture or focal lesion. Sinuses/Orbits: No acute finding. Other: None IMPRESSION: 1. No interval change from the previous day's head CT. Small area of right MCA distribution infarction along the right posterosuperior frontal cortex. There is also a small area of possible recent infarction involving the posterior right occipital/parietal lobe. 2. No new areas of infarction and no intracranial hemorrhage. No hydrocephalus. Electronically Signed   By: Lajean Manes M.D.   On:  04/09/2020 14:50   CT HEAD WO CONTRAST  Result Date: 04/08/2020 CLINICAL DATA:  Stroke follow-up EXAM: CT HEAD WITHOUT CONTRAST TECHNIQUE: Contiguous axial images were obtained from the base of the skull through the vertex without intravenous contrast. COMPARISON:  Yesterday FINDINGS: Brain: Moderate posterior right frontal infarct with cytotoxic edema obscuring the cortex. There is also a small recent appearing infarct in the right occipital cortex just above a pre-existing cortical infarct. Small right  superior cerebellar infarction which is also become apparent since prior. No hemorrhage, hydrocephalus, or collection. Chronic small vessel ischemia in the cerebral white matter. Vascular: No hyperdense vessel.  Atherosclerotic calcification Skull: Normal. Negative for fracture or focal lesion. Sinuses/Orbits: Right cataract resection.  No acute finding IMPRESSION: 1. Moderate acute infarct at the right posterior frontal cortex. 2. Small recent right occipital cortex infarct adjacent to a more chronic infarct. 3. Small acute right superior cerebellar infarct. Electronically Signed   By: Monte Fantasia M.D.   On: 04/08/2020 09:32   US Carotid Bilateral  Result Date: 04/08/2020 CLINICAL DATA:  Stroke-like symptoms EXAM: BILATERAL CAROTID DUPLEX ULTRASOUND TECHNIQUE: Pearline Cables scale imaging, color Doppler and duplex ultrasound were performed of bilateral carotid and vertebral arteries in the neck. COMPARISON:  Prior duplex carotid ultrasound 10/15/2019 FINDINGS: Criteria: Quantification of carotid stenosis is based on velocity parameters that correlate the residual internal carotid diameter with NASCET-based stenosis levels, using the diameter of the distal internal carotid lumen as the denominator for stenosis measurement. The following velocity measurements were obtained: RIGHT ICA: 142/14 cm/sec CCA: 10/1 cm/sec SYSTOLIC ICA/CCA RATIO:  1.9 ECA:  75 cm/sec LEFT ICA: 69/9 cm/sec CCA: 75/1 cm/sec SYSTOLIC ICA/CCA  RATIO:  1.2 ECA:  70 cm/sec RIGHT CAROTID ARTERY: Focal heterogeneous atherosclerotic plaque in the proximal internal carotid artery. By peak systolic velocity criteria, the estimated stenosis falls in the 50-69% diameter range. RIGHT VERTEBRAL ARTERY:  Patent with normal antegrade flow. LEFT CAROTID ARTERY: No significant atherosclerotic plaque or evidence of stenosis in the internal carotid artery. LEFT VERTEBRAL ARTERY:  Patent with normal antegrade flow. IMPRESSION: 1. Moderate (50-69%) stenosis proximal right internal carotid artery secondary to heterogenous atherosclerotic plaque. 2. No significant atherosclerotic plaque or evidence of stenosis in the left internal carotid artery. 3. Vertebral arteries remain patent with normal antegrade flow. Signed, Criselda Peaches, MD, [facility] Radiology Electronically Signed   By: Jacqulynn Cadet M.D.   On: 04/08/2020 11:12        Scheduled Meds:  amiodarone  200 mg Oral Daily   atorvastatin  80 mg Oral q1800   Chlorhexidine Gluconate Cloth  6 each Topical Daily   clopidogrel  75 mg Oral Daily   memantine  10 mg Oral BID   omega-3 acid ethyl esters  1 g Oral BID   pantoprazole  40 mg Oral Daily   sodium chloride flush  3 mL Intravenous Q12H   cyanocobalamin  1,000 mcg Oral Daily   Continuous Infusions:  sodium chloride     cefUROXime (ZINACEF)  IV 750 mg (04/09/20 1600)    Assessment & Plan:   Principal Problem:   UTI (urinary tract infection) Active Problems:   A-fib (HCC)   Bladder carcinoma (HCC)   AKI (acute kidney injury) ([place])   Coronary artery disease   Acute urinary retention   Anasarca   Ascites  Acute ischemic stroke-with LUE flaccid.  Likely etiology is cardioembolic due to A. fib with recent discontinuation of his anticoagulation. Neurology was consulted. Stat CT of the head was negative for bleed Unable to do MRI or MRA due to pacemaker Per  neurology okay to continue Plavix and high intensity statin Permissive hypertension up to 180 210 mmHg initially 9/4 . Confused today. Repeat CT of the head this a.m. reveals moderate acute infarct and couple other small infarcts please see full report Per neurology okay to resume Eliquis tomorrow, approximately 3 days after stroke to minimize risk of hemorrhagic conversion.  9/5-still confused, maybe bit more. Possibly delirium.  However since resuming Eliquis will obtain CT of the head and I chatted Dr. Malen Gauze neurology he was okay with this plan.  Repeat CT today no new infarction, ok to resume Eliquis per neuro via chat. 1:1 sit. I discussed with nurse kelly. Continue statin bp goal <140/90 LDL goal less than 70, currently at goal of 53 Check A1c Frequent neuro checks Speech eval completed mechanical soft diet consistent with gravies please see speech report PT OT recommends CIR    Urinary tract infection with acute urinary retention-on admission Patient complained of difficulty voiding and bladder scan reveals > 900cc of urine Patient also has pyuria Foley was placed on admission Urine culture less than 10,000 colonies 9/5 was on Rocephin  Empirically, but since treating for peritonitis switched her to cefuroxime     Anasarca Unclear etiology possibly cardiorenal source Echo EF 50 to 55% Holding Lasix and ACE for now We will check prealbumin, may benefit from albumin infusion Nephrology following    AKI-AKI possibly secondary to acute hepatorenal syndrome versus prerenal azotemia At baseline patient has a serum creatinine of 1.36 >> 2.11..>>>1.69 Imaging is negative for obstructive uropathy Nephrologist input was appreciated- Continue to hold losartan  Held IV Lasix and spironolactone initially due to permissive hypertension from his acute stroke on admission we will continue for now and will discuss with nephrology when to resume  Creatinine today is 1.56 which is  improving  Continue to monitor       Ascites-New onset No known history of liver cirrhosis Patient has a history of bladder cancer, ??  Ascites secondary to metastatic disease S/p 5 L of cloudy fluid removed during paracentesis.  White blood cell found on fluid culture.   Final cultures no growth  Continue cefuroxime mean for treatment of peritonitis     History of atrial fibrillation Rate controlled Patient is on apixaban as primary prophylaxis for an acute stroke Eliquis was held for paracentesis.  Patient had acute stroke and we had to hold his Eliquis.  I am unsure if he takes it at home.  I spoke to his son but he was not sure.  Will need to discuss his compliance with his wife tomorrow.  Again he was held to prevent his acute stroke converting to hemorrhagic stroke.   Cleared by neurology to resume, will resume Eliquis 2.5 twice daily  Cardiology was consulted since patient had a recent MI and stent back in March.  They agree patient to take Eliquis 2.5 mg twice daily with Plavix 75 daily and holding aspirin.     History of coronary artery disease Status post stent angioplasty in 03/21 Continue Plavix and statin.   Metoprolol on hold due to permissive hypertension.  If BP starts going up will resume. Per cardiology patient does not need dual antiplatelet therapy since he is also on apixaban-but will holding Eliquis for now to prevent transition to hemorrhagic stroke Will resume Eliquis and d/c plavix in am after confirming with cardiology  History of bladder cancer Patient was diagnosed with high-grade urothelial carcinoma and is status post radiation and chemotherapy Follow-up with oncology as outpatient  Palliative consulted-9/4  DVT prophylaxis: SCD Code Status: Full Family Communication: Updated son via phone.  All questions and concerns answered.  Did discuss about patient's CODE STATUS and his current medical conditions.  Did discuss that I have consulted  palliative care with wife's permission.  They plan to have a family meeting and discuss  his CODE STATUS and further aggressive management if needed.  Status is: Inpatient  Remains inpatient appropriate because:Inpatient level of care appropriate due to severity of illness   Dispo: The patient is from: Home              Anticipated d/c is to: CIR              Anticipated d/c date is: 3 days              Patient currently is not medically stable to d/c.Pt very confused . Had recent acute stroke. Requiring close monitoring and iv treatment. Assess needs daily.         LOS: 3 days   Time spent: 35 minutes with more than 50% on [place], MD [facility] Pager [phone]  If 7PM-7AM, please contact night-coverage [email or web address] Password [facility] 04/09/2020, 5:56 PM

## 2020-04-09 NOTE — Progress Notes (Signed)
Called [facility] regarding patient's restlessness and attempts to get out of bed.  Appropriate orders were placed for anti-anxiety medicine.  Will continue to monitor.  Christene Slates

## 2020-04-09 NOTE — Plan of Care (Signed)
Continuing with plan of care. 

## 2020-04-10 ENCOUNTER — Inpatient Hospital Stay: Payer: Medicare HMO

## 2020-04-10 DIAGNOSIS — C679 Malignant neoplasm of bladder, unspecified: Secondary | ICD-10-CM

## 2020-04-10 DIAGNOSIS — D696 Thrombocytopenia, unspecified: Secondary | ICD-10-CM

## 2020-04-10 LAB — BODY FLUID CULTURE: Culture: NO GROWTH

## 2020-04-10 LAB — CBC
HCT: 36.9 % — ABNORMAL LOW (ref 39.0–52.0)
Hemoglobin: 12.5 g/dL — ABNORMAL LOW (ref 13.0–17.0)
MCH: 31 pg (ref 26.0–34.0)
MCHC: 33.9 g/dL (ref 30.0–36.0)
MCV: 91.6 fL (ref 80.0–100.0)
Platelets: 63 10*3/uL — ABNORMAL LOW (ref 150–400)
RBC: 4.03 MIL/uL — ABNORMAL LOW (ref 4.22–5.81)
RDW: 15.8 % — ABNORMAL HIGH (ref 11.5–15.5)
WBC: 9.7 10*3/uL (ref 4.0–10.5)
nRBC: 0 % (ref 0.0–0.2)

## 2020-04-10 LAB — BASIC METABOLIC PANEL
Anion gap: 9 (ref 5–15)
BUN: 30 mg/dL — ABNORMAL HIGH (ref 8–23)
CO2: 22 mmol/L (ref 22–32)
Calcium: 8.2 mg/dL — ABNORMAL LOW (ref 8.9–10.3)
Chloride: 107 mmol/L (ref 98–111)
Creatinine, Ser: 1.72 mg/dL — ABNORMAL HIGH (ref 0.61–1.24)
GFR calc Af Amer: 42 mL/min — ABNORMAL LOW (ref >60)
GFR calc non Af Amer: 36 mL/min — ABNORMAL LOW (ref >60)
Glucose, Bld: 120 mg/dL — ABNORMAL HIGH (ref 70–99)
Potassium: 3.8 mmol/L (ref 3.5–5.1)
Sodium: 138 mmol/L (ref 135–145)

## 2020-04-10 LAB — URINE CULTURE: Culture: NO GROWTH

## 2020-04-10 MED ORDER — METHYLPREDNISOLONE SODIUM SUCC 125 MG IJ SOLR: 60.0000 mg | Freq: Once | INTRAMUSCULAR | Status: DC

## 2020-04-10 MED ORDER — MORPHINE 100MG IN NS 100ML (1MG/ML) PREMIX INFUSION
5.0000 mg/h | INTRAVENOUS | Status: DC
  Administered 2020-04-10 – 2020-04-11 (×2): 5 mg/h via INTRAVENOUS
  Filled 2020-04-10 (×2): qty 100

## 2020-04-10 MED ORDER — ALBUTEROL SULFATE (2.5 MG/3ML) 0.083% IN NEBU
2.5000 mg | INHALATION_SOLUTION | Freq: Once | RESPIRATORY_TRACT | Status: AC
  Administered 2020-04-10: 2.5 mg via RESPIRATORY_TRACT
  Filled 2020-04-10: qty 3

## 2020-04-10 NOTE — Plan of Care (Signed)
Per Dr. Kurtis Bushman, no need to see patient, goals are set. Will D/C consult.

## 2020-04-10 NOTE — Care Management Important Message (Signed)
Important Message  Patient Details  Name: Joseph Houston MRN: [id number] Date of Birth: 1936/03/24   Medicare Important Message Given:  Yes     [name] 04/10/2020, 1:05 PM

## 2020-04-10 NOTE — Progress Notes (Signed)
[facility] Cardiology  SUBJECTIVE: Patient nonconversant   Vitals:   04/09/20 1335 04/09/20 2001 04/10/20 0441 04/10/20 1045  BP: 105/61 (!) 119/52 (!) 143/76 134/73  Pulse: 85 82 (!) 109 100  Resp: 18 (!) 22 20 (!) 22  Temp: 98.2 F (36.8 C) (!) 97.5 F (36.4 C) 97.7 F (36.5 C) 97.7 F (36.5 C)  TempSrc: Oral Axillary Oral Oral  SpO2: 98% 94% 94% 94%  Weight:      Height:         Intake/Output Summary (Last 24 hours) at 04/10/2020 1054 Last data filed at 04/10/2020 4193 Gross per 24 hour  Intake 478.94 ml  Output 450 ml  Net 28.94 ml      PHYSICAL EXAM  General: Well developed, well nourished, in no acute distress HEENT:  Normocephalic and atramatic Neck:  No JVD.  Lungs: Clear bilaterally to auscultation and percussion. Heart: HRRR . Normal S1 and S2 without gallops or murmurs.  Abdomen: Bowel sounds are positive, abdomen soft and non-tender  Msk:  Back normal, normal gait. Normal strength and tone for age. Extremities: No clubbing, cyanosis or edema.   Neuro: Alert and oriented X 3. Psych:  Good affect, responds appropriately   LABS: Basic Metabolic Panel: Recent Labs    04/09/20 0805 04/10/20 0723  NA 134* 138  K 4.0 3.8  CL 102 107  CO2 23 22  GLUCOSE 129* 120*  BUN 28* 30*  CREATININE 1.56* 1.72*  CALCIUM 8.0* 8.2*   Liver Function Tests: No results for input(s): AST, ALT, ALKPHOS, BILITOT, PROT, ALBUMIN in the last 72 hours. No results for input(s): LIPASE, AMYLASE in the last 72 hours. CBC: Recent Labs    04/08/20 0428 04/10/20 0723  WBC 8.0 9.7  HGB 12.3* 12.5*  HCT 37.5* 36.9*  MCV 93.5 91.6  PLT 175 63*   Cardiac Enzymes: No results for input(s): CKTOTAL, CKMB, CKMBINDEX, TROPONINI in the last 72 hours. BNP: Invalid input(s): POCBNP D-Dimer: No results for input(s): DDIMER in the last 72 hours. Hemoglobin A1C: Recent Labs    04/09/20 0805  HGBA1C 5.4   Fasting Lipid Panel: Recent Labs    04/09/20 0805  CHOL 123  HDL 54   LDLCALC 53  TRIG 79  CHOLHDL 2.3   Thyroid Function Tests: No results for input(s): TSH, T4TOTAL, T3FREE, THYROIDAB in the last 72 hours.  Invalid input(s): FREET3 Anemia Panel: No results for input(s): VITAMINB12, FOLATE, FERRITIN, TIBC, IRON, RETICCTPCT in the last 72 hours.  CT HEAD WO CONTRAST  Result Date: 04/09/2020 CLINICAL DATA:  Continue confusion.  Stroke follow-up. EXAM: CT HEAD WITHOUT CONTRAST TECHNIQUE: Contiguous axial images were obtained from the base of the skull through the vertex without intravenous contrast. COMPARISON:  Head CT, 04/08/2020 FINDINGS: Brain: Small area of right posterior frontal lobe infarction is unchanged from the previous day's exam. There is also a small area of hypoattenuation involving the gray-white junction of the posterior right occipital/parietal lobe. No other evidence of recent infarction. No hydrocephalus and no intracranial hemorrhage. Ventricles and sulci are enlarged reflecting mild diffuse atrophy. There are patchy areas of white matter hypoattenuation consistent with chronic microvascular ischemic change. Small old cerebellar infarcts. Vascular: No hyperdense vessel or unexpected calcification. Skull: Normal. Negative for fracture or focal lesion. Sinuses/Orbits: No acute finding. Other: None IMPRESSION: 1. No interval change from the previous day's head CT. Small area of right MCA distribution infarction along the right posterosuperior frontal cortex. There is also a small area of possible recent infarction  involving the posterior right occipital/parietal lobe. 2. No new areas of infarction and no intracranial hemorrhage. No hydrocephalus. Electronically Signed   By: Lajean Manes M.D.   On: 04/09/2020 14:50     Echo LVEF 50 to 55%  TELEMETRY:   ASSESSMENT AND PLAN:  Principal Problem:   UTI (urinary tract infection) Active Problems:   A-fib (HCC)   Bladder carcinoma (HCC)   AKI (acute kidney injury) ([place])   Coronary artery  disease   Acute urinary retention   Anasarca   Ascites    1.  Embolic CVA, manifested with left upper extremity weakness, patient now nonconversant, felt to be due to holding Eliquis for paracentesis 2.  Chronic atrial fibrillation, on Eliquis for stroke prevention 3.  History of inferior STEMI, status post drug-eluting stent proximal RCA 10/06/2019 4.  High-grade urothelial bladder cancer status post chemotherapy and radiation therapy 5.  Chronic kidney disease 6.  Anasarca / ascites, status post paracentesis  Recommendations  1.  Agree with overall current therapy 2.  Resume Eliquis 2.5 mg twice daily  (>28 years old, creatinine > 1.5 ) 3.  Resume clopidogrel 75 mg daily 4.  Hold aspirin 5.  No further cardiac diagnostics at this time.  No clear indication for transesophageal echocardiogram since results would not impact or change current coagulation scheme, and the patient currently is nonconversant and confused would be noncompliant with procedure   Isaias Cowman, MD, PhD, [facility] 04/10/2020 10:54 AM

## 2020-04-10 NOTE — Progress Notes (Signed)
[place], [place] 04/10/20  Subjective:  [name] with past medical history significant for COPD, atrial fibrillation on anticoagulation, hypertension, coronary artery disease, hepatic cirrhosis  admitted to [facility] after having abdominal pain, weakness and nausea/vomiting.  Patient is restless this morning.  Mouth appears dry.  Encephalopathic.  Answers a few simple questions but did not wake up for a conversation.  Objective:  Vital signs in last 24 hours:  Temp:  [97.5 F (36.4 C)-98.2 F (36.8 C)] 97.7 F (36.5 C) (09/06 0441) Pulse Rate:  [82-109] 109 (09/06 0441) Resp:  [18-22] 20 (09/06 0441) BP: (105-143)/(52-76) 143/76 (09/06 0441) SpO2:  [94 %-98 %] 94 % (09/06 0441)  Weight change:  Filed Weights   04/05/20 1301 04/07/20 1435  Weight: 101.6 kg 95.5 kg    Intake/Output:    Intake/Output Summary (Last 24 hours) at 04/10/2020 0952 Last data filed at 04/10/2020 8144 Gross per 24 hour  Intake 478.94 ml  Output 450 ml  Net 28.94 ml     Physical Exam: General:  Restless, lethargic  HEENT Normocephalic.Atraumatic, dry oral mucous membranes  Pulm/lungs Diminished at the bases  CVS/Heart  S1S2, irregular  Abdomen:   Mildly distended, Ascites +  Extremities:  Trace to 1+ edema  Neurologic:  Lethargic, answers a few simple questions  Skin: No acute lesions or rashes          Basic Metabolic Panel:  Recent Labs  Lab 04/05/20 1305 04/05/20 1305 04/07/20 0646 04/07/20 0646 04/08/20 0428 04/09/20 0805 04/10/20 0723  NA 133*  --  136  --  134* 134* 138  K 4.1  --  4.0  --  3.7 4.0 3.8  CL 102  --  105  --  101 102 107  CO2 20*  --  21*  --  24 23 22   GLUCOSE 121*  --  95  --  142* 129* 120*  BUN 39*  --  37*  --  31* 28* 30*  CREATININE 2.11*  --  1.89*  --  1.69* 1.56* 1.72*  CALCIUM 8.4*   < > 8.5*   < > 8.2* 8.0* 8.2*   < > = values in this interval not displayed.     CBC: Recent Labs  Lab 04/05/20 1305  04/07/20 0646 04/08/20 0428 04/10/20 0723  WBC 6.4 6.9 8.0 9.7  HGB 12.9* 12.6* 12.3* 12.5*  HCT 39.7 38.3* 37.5* 36.9*  MCV 93.9 93.0 93.5 91.6  PLT 298 229 175 63*     No results found for: HEPBSAG, HEPBSAB, HEPBIGM    Microbiology:  Recent Results (from the past 240 hour(s))  Urine Culture     Status: Abnormal   Collection Time: 04/05/20  1:05 PM   Specimen: Urine, Clean Catch  Result Value Ref Range Status   Specimen Description   Final    URINE, CLEAN CATCH Performed at [facility], [address] [place], [place] [address]    Special Requests   Final    Normal Performed at [facility], [address] [place], [place] [address]    Culture (A)  Final    <10,000 COLONIES/mL INSIGNIFICANT GROWTH Performed at [facility], [place] [address] [place], [place] [address]    Report Status 04/10/2020 FINAL  Final  SARS Coronavirus 2 by RT PCR (hospital order, performed in [facility] hospital lab) Nasopharyngeal Nasopharyngeal Swab     Status: None   Collection Time: 04/12/2020  2:01 AM  Specimen: Nasopharyngeal Swab  Result Value Ref Range Status   SARS Coronavirus 2 NEGATIVE NEGATIVE Final    Comment: (NOTE) SARS-CoV-2 target nucleic acids are NOT DETECTED.  The SARS-CoV-2 RNA is generally detectable in upper and lower respiratory specimens during the acute phase of infection. The lowest concentration of SARS-CoV-2 viral copies this assay can detect is 250 copies / mL. A negative result does not preclude SARS-CoV-2 infection and should not be used as the sole basis for treatment or other patient management decisions.  A negative result may occur with improper specimen collection / handling, submission of specimen other than nasopharyngeal swab, presence of viral mutation(s) within the areas targeted by this assay, and inadequate number of viral copies (<250 copies / mL). A negative result must be combined with clinical observations, patient  history, and epidemiological information.  Fact Sheet for Patients:   StrictlyIdeas.no  Fact Sheet for Healthcare Providers: BankingDealers.co.za  This test is not yet approved or  cleared by the [place] FDA and has been authorized for detection and/or diagnosis of SARS-CoV-2 by FDA under an Emergency Use Authorization (EUA).  This EUA will remain in effect (meaning this test can be used) for the duration of the COVID-19 declaration under Section 564(b)(1) of the Act, 21 U.S.C. section 360bbb-3(b)(1), unless the authorization is terminated or revoked sooner.  Performed at [facility], [address] [place], [place] [address]   Body fluid culture     Status: None (Preliminary result)   Collection Time: 04/07/2020  4:35 PM   Specimen: PATH Cytology Peritoneal fluid  Result Value Ref Range Status   Specimen Description   Final    PERITONEAL Performed at [facility], [address], [place], [place] [address]    Special Requests   Final    NONE Performed at [facility], [address] [place], [place] [address]    Gram Stain   Final    FEW WBC PRESENT, PREDOMINANTLY MONONUCLEAR NO ORGANISMS SEEN    Culture   Final    NO GROWTH 3 DAYS Performed at [facility], [place] [address], [place], [place] [address]    Report Status PENDING  Incomplete  CULTURE, BLOOD (ROUTINE X 2) w Reflex to ID Panel     Status: None (Preliminary result)   Collection Time: 04/07/20  3:01 PM   Specimen: BLOOD  Result Value Ref Range Status   Specimen Description BLOOD RIGHT [facility]  Final   Special Requests   Final    BOTTLES DRAWN AEROBIC AND ANAEROBIC Blood Culture adequate volume   Culture   Final    NO GROWTH 3 DAYS Performed at [facility], [address] [place], [place] [address]    Report Status PENDING  Incomplete  CULTURE, BLOOD (ROUTINE X 2) w Reflex to ID Panel     Status: None  (Preliminary result)   Collection Time: 04/07/20  3:53 PM   Specimen: BLOOD  Result Value Ref Range Status   Specimen Description BLOOD RIGHT ANTECUBITAL  Final   Special Requests   Final    BOTTLES DRAWN AEROBIC AND ANAEROBIC Blood Culture adequate volume   Culture   Final    NO GROWTH 3 DAYS Performed at [facility], [address], [place], [place] [address]    Report Status PENDING  Incomplete    Coagulation Studies: No results for input(s): LABPROT, INR in the last 72 hours.  Urinalysis: Recent Labs    04/09/20 1145  COLORURINE AMBER*  LABSPEC 1.020  PHURINE 5.0  GLUCOSEU NEGATIVE  HGBUR LARGE*  BILIRUBINUR NEGATIVE  KETONESUR NEGATIVE  PROTEINUR 100*  NITRITE NEGATIVE  LEUKOCYTESUR TRACE*      Imaging: CT HEAD WO CONTRAST  Result Date: 04/09/2020 CLINICAL DATA:  Continue confusion.  Stroke follow-up. EXAM: CT HEAD WITHOUT CONTRAST TECHNIQUE: Contiguous axial images were obtained from the base of the skull through the vertex without intravenous contrast. COMPARISON:  Head CT, 04/08/2020 FINDINGS: Brain: Small area of right posterior frontal lobe infarction is unchanged from the previous day's exam. There is also a small area of hypoattenuation involving the gray-white junction of the posterior right occipital/parietal lobe. No other evidence of recent infarction. No hydrocephalus and no intracranial hemorrhage. Ventricles and sulci are enlarged reflecting mild diffuse atrophy. There are patchy areas of white matter hypoattenuation consistent with chronic microvascular ischemic change. Small old cerebellar infarcts. Vascular: No hyperdense vessel or unexpected calcification. Skull: Normal. Negative for fracture or focal lesion. Sinuses/Orbits: No acute finding. Other: None IMPRESSION: 1. No interval change from the previous day's head CT. Small area of right MCA distribution infarction along the right posterosuperior frontal cortex. There is also a small area of  possible recent infarction involving the posterior right occipital/parietal lobe. 2. No new areas of infarction and no intracranial hemorrhage. No hydrocephalus. Electronically Signed   By: Lajean Manes M.D.   On: 04/09/2020 14:50     Medications:   . sodium chloride Stopped (04/10/20 0123)  . cefUROXime (ZINACEF)  IV Stopped (04/10/20 0043)   . amiodarone  200 mg Oral Daily  . atorvastatin  80 mg Oral q1800  . Chlorhexidine Gluconate Cloth  6 each Topical Daily  . clopidogrel  75 mg Oral Daily  . memantine  10 mg Oral BID  . omega-3 acid ethyl esters  1 g Oral BID  . pantoprazole  40 mg Oral Daily  . sodium chloride flush  3 mL Intravenous Q12H  . cyanocobalamin  1,000 mcg Oral Daily   sodium chloride, HYDROcodone-acetaminophen, LORazepam, ondansetron **OR** ondansetron (ZOFRAN) IV, sodium chloride flush, traZODone  Assessment/ Plan:  84 y.o. male with COPD, atrial fibrillation on anticoagulation, hypertension, coronary artery disease, hepatic cirrhosis, history of bladder cancer admitted to [facility] after having abdominal pain, weakness and nausea/vomiting.   Principal Problem:   UTI (urinary tract infection) Active Problems:   A-fib ([place])   Bladder carcinoma ([place])   AKI (acute kidney injury) ([place])   Coronary artery disease   Acute urinary retention   Anasarca   Ascites   #. Acute Renal Failure on CKD stage 3A  Recent Labs    04/07/20 0646 04/08/20 0428 04/09/20 0805 04/10/20 0723  CREATININE 1.89* 1.69* 1.56* 1.72*   Acute renal failure most likely secondary to acute hepatorenal syndrome versus prerenal azotemia.  - Agree with holding IV Lasix, losartan Avoid nonsteroidals IV contrast Consider gentle IV hydration   #  Acute Ischemic Stroke -Not candidate for TPA.   -Management as per primary team  # cirrhosis with ascites Paracentesis on 9/2, 5270 cc of fluid was removed   Murlean Iba , MD [facility]  9/6/20219:52 AM    LOS: [place] 9/6/20219:52 [place]sey Hubbell, [place]

## 2020-04-10 NOTE — Psychosocial Assessment (Addendum)
At 12:45 pt started on 25mL.per hour of morphine drip. At 13: 39 increased dose to 59mL/hr. Will assess in two hours.   At 14:50, morphine increased to 3 mL.hour.

## 2020-04-10 NOTE — Progress Notes (Signed)
Inpatient Rehab Admissions Coordinator:   Per MD, pt. Is now comfort care. CIR will sign off.   Clemens Catholic, [place], [place] Admissions Coordinator  [phone] ([place]) [phone] (office)

## 2020-04-10 NOTE — Progress Notes (Signed)
Physical Therapy Treatment Patient Details Name: Joseph Houston MRN: [id number] DOB: 1936-08-01 Today's Date: 04/10/2020    History of Present Illness 84 y.o. male with past medical history significant for COPD, bladder cancer,  atrial fibrillation on anticoagulation, hypertension, coronary artery disease, CKD stage III,  hepatic cirrhosis with ascites and anasarca admitted with Acute renal failure and UTI  With new onset left arm paralysis concerning for acute stroke. Stat CT head was obtained which rule out hemorrhage.  Patient not candidate for TPA as he is on anticoagulation with Eliquis, last dose within 48 hours as well as outside TPA window.  Clinically does not appear LVO due to absence of neglect both visual and sensory, supect a distal occlusion.   CTA not performed due to acute kidney injury. Cannot have MRI due to pacemaker.  Repeat head CT shows moderate acute infarct at the R posterior frontal cortex, small recent right occipital cortex infarct adjacent to a more chronic infarct, small acute right superior cerebellar infarct. CT scan of abdomen and pelvis showed large volume ascites which has seemingly increased from an outside CT report June 2021.    PT Comments    PT treatment completed. Patient lethargic during session with difficulty maintaining alertness for participation with therapy efforts. When awake, patient is restless and agitated at times (hitting bed rail with RUE and trying to sit up in bed). Patient needs maximal assistance for bed mobility with cues for task initiation and sequencing. Patient participated with LLE strengthening therapeutic exercises intermittently after facilitation and cueing and verbal instruction/stimulation. Recommend to continue PT to maximize independence in preparation for next level of care.     Follow Up Recommendations  CIR     Equipment Recommendations    to be determined at next level of care    Recommendations for Other Services        Precautions / Restrictions Precautions Precautions: Fall Restrictions Weight Bearing Restrictions: No    Mobility  Bed Mobility Overal bed mobility: Needs Assistance Bed Mobility: Supine to Sit;Sit to Supine     Supine to sit: Max assist Sit to supine: Max assist   General bed mobility comments: assistance for BLE and trunk support. verbal and tactile cues for task initiation, sequencing, and technique. patient requesting to return to supine immediately after sitting up   Transfers                 General transfer comment: not assessed due to lethargy and poor sitting tolerance   Ambulation/Gait                 Stairs             Wheelchair Mobility    Modified Rankin (Stroke Patients Only)       Balance Overall balance assessment: Needs assistance Sitting-balance support: No upper extremity supported Sitting balance-Leahy Scale: Poor Sitting balance - Comments: Mod A- Max A for midline trunk                                     Cognition Arousal/Alertness: Lethargic Behavior During Therapy: Agitated;Restless (lethargic but agitated and restless when awake ) Overall Cognitive Status: Impaired/Different from baseline Area of Impairment: Orientation;Following commands;Attention                 Orientation Level: Disoriented to;Time;Situation Current Attention Level:  (difficulty maintaining alertness )   Following Commands: Follows one step  commands with increased time              Exercises General Exercises - Lower Extremity Ankle Circles/Pumps: AAROM;Left;10 reps;Supine;PROM;Strengthening Short Arc Quad: AAROM;Strengthening;Left;10 reps;Supine;PROM Heel Slides: AAROM;Strengthening;Left;10 reps;Supine;PROM Hip ABduction/ADduction: Strengthening;AAROM;PROM;Left;10 reps;Supine Other Exercises Other Exercises: exercises performed with PROM initially and intermittent A.AROM with patient participation. tactile  and verbal cues to maintain alerteness and for participation     General Comments        Pertinent Vitals/Pain Pain Assessment: No/denies pain    Home Living                      Prior Function            PT Goals (current goals can now be found in the care plan section) Acute Rehab PT Goals Patient Stated Goal: patient unable to state  PT Goal Formulation: Patient unable to participate in goal setting Time For Goal Achievement: 04/22/20 Progress towards PT goals: Progressing toward goals    Frequency    Min 1X/week      PT Plan Current plan remains appropriate    Co-evaluation              AM-PAC PT "6 Clicks" Mobility   Outcome Measure  Help needed turning from your back to your side while in a flat bed without using bedrails?: A Lot Help needed moving from lying on your back to sitting on the side of a flat bed without using bedrails?: A Lot Help needed moving to and from a bed to a chair (including a wheelchair)?: Total Help needed standing up from a chair using your arms (e.g., wheelchair or bedside chair)?: Total Help needed to walk in hospital room?: Total Help needed climbing 3-5 steps with a railing? : Total 6 Click Score: 8    End of Session   Activity Tolerance: Patient limited by lethargy;Patient limited by fatigue Patient left: in bed;with call bell/phone within reach;with bed alarm set (telesitter ) Nurse Communication:  (white board updated with mobility status ) PT Visit Diagnosis: Unsteadiness on feet (R26.81);Hemiplegia and hemiparesis;Difficulty in walking, not elsewhere classified (R26.2);Muscle weakness (generalized) (M62.81) Hemiplegia - Right/Left: Left Hemiplegia - dominant/non-dominant: Non-dominant Hemiplegia - caused by: Cerebral infarction     Time: 1102-1117 PT Time Calculation (min) (ACUTE ONLY): 23 min  Charges:  $Therapeutic Exercise: 8-22 mins $Therapeutic Activity: 8-22 mins                    Minna Merritts, PT, MPT    Joseph Houston 04/10/2020, 12:40 PM

## 2020-04-10 NOTE — Consult Note (Signed)
[place] CONSULT NOTE  Patient Care Team: Tracie Harrier, MD as PCP - General (Internal Medicine)  CHIEF COMPLAINTS/PURPOSE OF CONSULTATION:   HISTORY OF PRESENTING ILLNESS:  Joseph Houston 84 y.o.  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  CT scan showed anasarca ascites.  Patient also treated with IV antibiotics/cephalosporin for possible UTI.  During the hospital had a episode of mental status changes/neurologic deficit-diagnosed with right-sided stroke.  Patient's Eliquis was held given the concerns for hemorrhagic transformation.  However in the last 2 days patient's neurological status has declined-likely due to delirium.  Of note this morning patient noted to have worsening thrombocytopenia.  Baseline platelets around 200.  However today platelets are 63.  Oncology has been consulted for further evaluation recommendations.   Review of Systems  Unable to perform ROS: Mental status change     MEDICAL HISTORY:  Past Medical History:  Diagnosis Date  . Anemia    was treated in the past, not now  . Anxiety   . Arthritis    knees  . BPH (benign prostatic hypertrophy)   . Chronic kidney disease    stage 3 ckd  . COPD (chronic obstructive pulmonary disease) ([place])    has had most of his life. has not used an inhaler for years  . Coronary artery disease   . Difficult intubation    pt reports bad sore throat after surgery. pt unsure of this response  . Dyspnea   . Dysrhythmia 2016   atrial fibrillation, bradycardia  . GERD (gastroesophageal reflux disease)   . Headache   . History of hiatal hernia   . History of kidney stones 04/2018  . Hypertension   . Pneumonia   . Pre-diabetes    dr. Ginette Pitman following  . Presence of permanent cardiac  pacemaker 2017  . Psoriasis 2019   elbows  . Restless leg   . STEMI involving right coronary artery ([place]) 10/07/2019  . Vertigo 1992  . Yeast infection    in groin for 50 years    SURGICAL HISTORY: Past Surgical History:  Procedure Laterality Date  . CATARACT EXTRACTION W/PHACO Right 09/25/2016   Procedure: CATARACT EXTRACTION PHACO AND INTRAOCULAR LENS PLACEMENT (IOC);  Surgeon: Estill Cotta, MD;  Location: [facility] ORS;  Service: Ophthalmology;  Laterality: Right;  [place] 01:58 AP% 18.1 CDE 43.36 Fluid pack # [id number]  . CORONARY STENT INTERVENTION N/A 10/06/2019   Procedure: CORONARY STENT INTERVENTION;  Surgeon: Yolonda Kida, MD;  Location: [place] [facility];  Service: Cardiovascular;  Laterality: N/A;  MID RCA  . CORONARY/GRAFT ACUTE MI REVASCULARIZATION N/A 10/06/2019   Procedure: Coronary/Graft Acute MI Revascularization;  Surgeon: Yolonda Kida, MD;  Location: [place] [facility];  Service: Cardiovascular;  Laterality: N/A;  . CYSTOSCOPY N/A 09/28/2019   Procedure: CYSTOSCOPY;  Surgeon: Abbie Sons, MD;  Location: [facility] ORS;  Service: Urology;  Laterality: N/A;  . CYSTOSCOPY/URETEROSCOPY/HOLMIUM LASER/STENT PLACEMENT Right 04/13/2018   Procedure: CYSTOSCOPY/URETEROSCOPY/HOLMIUM LASER/STENT PLACEMENT;  Surgeon: Hollice Espy, MD;  Location: [facility] ORS;  Service: Urology;  Laterality: Right;  . ELECTROPHYSIOLOGIC STUDY N/A 12/22/2014   Procedure: CARDIOVERSION;  Surgeon: Teodoro Spray, MD;  Location: [facility] ORS;  Service: Cardiovascular;  Laterality: N/A;  . FINGER SURGERY Right 1950   index finger cut off half way  . HERNIA REPAIR Bilateral  inguinal repaired x 4  . INSERT / REPLACE / REMOVE PACEMAKER     12/17  . JOINT REPLACEMENT Right 2012   partial knee replacement  . LEFT HEART CATH AND CORONARY ANGIOGRAPHY N/A 10/06/2019   Procedure: LEFT HEART CATH AND CORONARY ANGIOGRAPHY;  Surgeon: Yolonda Kida, MD;  Location: [place] [facility];  Service:  Cardiovascular;  Laterality: N/A;  . PACEMAKER INSERTION Left 07/17/2016   Procedure: INSERTION PACEMAKER;  Surgeon: Isaias Cowman, MD;  Location: [facility];  Service: Cardiovascular;  Laterality: Left;  . PARTIAL KNEE ARTHROPLASTY Right 2012  . TONSILLECTOMY  1950  . TOTAL KNEE ARTHROPLASTY Left 09/30/2017   Procedure: TOTAL KNEE ARTHROPLASTY;  Surgeon: Hessie Knows, MD;  Location: [facility];  Service: Orthopedics;  Laterality: Left;  . TRANSURETHRAL RESECTION OF BLADDER TUMOR N/A 09/28/2019   Procedure: TRANSURETHRAL RESECTION OF BLADDER TUMOR (TURBT);  Surgeon: Abbie Sons, MD;  Location: [facility];  Service: Urology;  Laterality: N/A;  . TRANSURETHRAL RESECTION OF PROSTATE      SOCIAL HISTORY: Social History   Socioeconomic History  . Marital status: Married    Spouse name: [place]  . Number of children: 4  . Years of education: Not on file  . Highest education level: Not on file  Occupational History  . 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 Smoking status: Former Smoker    Packs/day: 2.00    Types: Cigarettes    Quit date: 12/21/1965    Years since quitting: 54.3  . Smokeless tobacco: Never Used  Vaping Use  . Vaping Use: Never used  Substance and Sexual Activity  . Alcohol use: No    Comment: last drink 1964  . Drug use: No  . Sexual activity: Not Currently  Other Topics Concern  . Not on file  Social History Narrative  . Not on file   Social Determinants of Health   Financial Resource Strain:   . Difficulty of Paying Living Expenses: Not on file  Food Insecurity:   . Worried About Charity fundraiser in the Last Year: Not on file  . Ran Out of Food in the Last Year: Not on file  Transportation Needs:   . Lack of Transportation (Medical): Not on file  . Lack of Transportation (Non-Medical): Not on file  Physical Activity:   . Days of Exercise per Week: Not on file  . Minutes of Exercise per Session: Not on file   Stress:   . Feeling of Stress : Not on file  Social Connections:   . Frequency of Communication with Friends and Family: Not on file  . Frequency of Social Gatherings with Friends and Family: Not on file  . Attends Religious Services: Not on file  . Active Member of Clubs or Organizations: Not on file  . Attends Archivist Meetings: Not on file  . Marital Status: Not on file  Intimate Partner Violence:   . Fear of Current or Ex-Partner: Not on file  . Emotionally Abused: Not on file  . Physically Abused: Not on file  . Sexually Abused: Not on file    FAMILY HISTORY: History reviewed. No pertinent family history.  ALLERGIES:  is allergic to mold extract [trichophyton], dog epithelium, other, tree extract, ciprofloxacin, codeine, and sulfa antibiotics.  MEDICATIONS:  Current Facility-Administered Medications  Medication Dose Route Frequency Provider Last Rate Last Admin  . 0.9 %  sodium chloride infusion  250 mL Intravenous PRN  Collier Bullock, MD   Stopped at 04/10/20 0123  . morphine 100mg  in NS 112mL (1mg /mL) infusion - premix  5 mg/hr Intravenous Continuous Nolberto Hanlon, MD 5 mL/hr at 04/10/20 1244 5 mg/hr at 04/10/20 1244  . sodium chloride flush (NS) 0.9 % injection 3 mL  3 mL Intravenous Q12H Agbata, Tochukwu, MD   3 mL at 04/09/20 2251  . sodium chloride flush (NS) 0.9 % injection 3 mL  3 mL Intravenous PRN Agbata, Tochukwu, MD          .  PHYSICAL EXAMINATION:  Vitals:   04/10/20 1045 04/10/20 1112  BP: 134/73   Pulse: 100   Resp: (!) 22   Temp: 97.7 F (36.5 C)   SpO2: 94% 95%   Filed Weights   04/05/20 1301 04/07/20 1435  Weight: 224 lb (101.6 kg) 210 lb 8.6 oz (95.5 kg)    Physical Exam Constitutional:      Comments: Cachectic appearing Caucasian male patient.  Alone.  HENT:     Head: Normocephalic and atraumatic.     Mouth/Throat:     Pharynx: No oropharyngeal exudate.  Eyes:     Pupils: Pupils are equal, round, and reactive to light.   Cardiovascular:     Rate and Rhythm: Normal rate and regular rhythm.  Pulmonary:     Effort: No respiratory distress.     Breath sounds: No wheezing.     Comments: Decreased air entry bilaterally. Abdominal:     General: Bowel sounds are normal. There is no distension.     Palpations: Abdomen is soft. There is no mass.     Tenderness: There is no abdominal tenderness. There is no guarding or rebound.  Musculoskeletal:        General: No tenderness. Normal range of motion.     Cervical back: Normal range of motion and neck supple.  Skin:    General: Skin is warm.  Neurological:     Comments: Mumbling incoherently; not following any verbal commands.  Psychiatric:        Mood and Affect: Affect normal.      LABORATORY DATA:  I have reviewed the data as listed Lab Results  Component Value Date   WBC 9.7 04/10/2020   HGB 12.5 (L) 04/10/2020   HCT 36.9 (L) 04/10/2020   MCV 91.6 04/10/2020   PLT 63 (L) 04/10/2020   Recent Labs    04/05/2020 0158 04/07/20 0646 04/08/20 0428 04/09/20 0805 04/10/20 0723  NA  --    < > 134* 134* 138  K  --    < > 3.7 4.0 3.8  CL  --    < > 101 102 107  CO2  --    < > 24 23 22   GLUCOSE  --    < > 142* 129* 120*  BUN  --    < > 31* 28* 30*  CREATININE  --    < > 1.69* 1.56* 1.72*  CALCIUM  --    < > 8.2* 8.0* 8.2*  GFRNONAA  --    < > 37* 41* 36*  GFRAA  --    < > 43* 48* 42*  PROT 5.7*  --   --   --   --   ALBUMIN 2.7*  --   --   --   --   AST 23  --   --   --   --   ALT 18  --   --   --   --  ALKPHOS 44  --   --   --   --   BILITOT 0.8  --   --   --   --   BILIDIR 0.2  --   --   --   --   IBILI 0.6  --   --   --   --    < > = values in this interval not displayed.    RADIOGRAPHIC STUDIES: I have personally reviewed the radiological images as listed and agreed with the findings in the report. CT ABDOMEN PELVIS WO CONTRAST  Result Date: 04/08/2020 CLINICAL DATA:  Abdominal distension and nonlocalized abdominal pain. EXAM: CT  ABDOMEN AND PELVIS WITHOUT CONTRAST TECHNIQUE: Multidetector CT imaging of the abdomen and pelvis was performed following the standard protocol without IV contrast. COMPARISON:  09/06/2019 and abdominal CT report from [facility] 01/14/2020 FINDINGS: Lower chest: Generous heart size. Dual-chamber pacer leads. Small pleural effusions on the left more than right with calcified pleural plaques. Hepatobiliary: Small appearance of the liver with some surface lobulation and caudate/fissure enlargement.Mild high-density at the dependent gallbladder without calcified stone or acute inflammatory finding Pancreas: Generalized fatty atrophy. Spleen: Unremarkable. Adrenals/Urinary Tract: Negative adrenals. Cystic densities on both sides of the renal cortex. Anterior and right lateral asymmetric bladder wall thickening. Although imperceptible on axial slices there is an intact posterior bladder wall by reformats Stomach/Bowel: No obstruction. No visible bowel inflammation. Proctitis by recent sigmoidoscopy, unremarkable by CT Vascular/Lymphatic: Multifocal atheromatous calcification. No mass or adenopathy. Reproductive:Mildly enlarged prostate. Other: Large volume ascites. There is reticulation of the greater omentum without nodularity or discrete masslike finding. Bilateral inguinal hernia repair. Musculoskeletal: No acute abnormalities. Lumbar spine degeneration, focally advanced at L4-5. IMPRESSION: 1. Large volume ascites which has seemingly increased from a outside CT report June 2021. There could be underlying liver cirrhosis. At sampling, consider including cytology given history of bladder cancer. 2. Small pleural effusions. 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 Electronically Signed   By: Monte Fantasia M.D.   On: 04/17/2020 05:42   CT HEAD WO CONTRAST  Result Date: 04/09/2020 CLINICAL DATA:  Continue confusion.  Stroke follow-up. EXAM: CT HEAD WITHOUT CONTRAST TECHNIQUE:  Contiguous axial images were obtained from the base of the skull through the vertex without intravenous contrast. COMPARISON:  Head CT, 04/08/2020 FINDINGS: Brain: Small area of right posterior frontal lobe infarction is unchanged from the previous day's exam. There is also a small area of hypoattenuation involving the gray-white junction of the posterior right occipital/parietal lobe. No other evidence of recent infarction. No hydrocephalus and no intracranial hemorrhage. Ventricles and sulci are enlarged reflecting mild diffuse atrophy. There are patchy areas of white matter hypoattenuation consistent with chronic microvascular ischemic change. Small old cerebellar infarcts. Vascular: No hyperdense vessel or unexpected calcification. Skull: Normal. Negative for fracture or focal lesion. Sinuses/Orbits: No acute finding. Other: None IMPRESSION: 1. No interval change from the previous day's head CT. Small area of right MCA distribution infarction along the right posterosuperior frontal cortex. There is also a small area of possible recent infarction involving the posterior right occipital/parietal lobe. 2. No new areas of infarction and no intracranial hemorrhage. No hydrocephalus. Electronically Signed   By: Lajean Manes M.D.   On: 04/09/2020 14:50   CT HEAD WO CONTRAST  Result Date: 04/08/2020 CLINICAL DATA:  Stroke follow-up EXAM: CT HEAD WITHOUT CONTRAST TECHNIQUE: Contiguous axial images were obtained from the base of the skull through the vertex without intravenous  contrast. COMPARISON:  Yesterday FINDINGS: Brain: Moderate posterior right frontal infarct with cytotoxic edema obscuring the cortex. There is also a small recent appearing infarct in the right occipital cortex just above a pre-existing cortical infarct. Small right superior cerebellar infarction which is also become apparent since prior. No hemorrhage, hydrocephalus, or collection. Chronic small vessel ischemia in the cerebral white matter.  Vascular: No hyperdense vessel.  Atherosclerotic calcification Skull: Normal. Negative for fracture or focal lesion. Sinuses/Orbits: Right cataract resection.  No acute finding IMPRESSION: 1. Moderate acute infarct at the right posterior frontal cortex. 2. Small recent right occipital cortex infarct adjacent to a more chronic infarct. 3. Small acute right superior cerebellar infarct. Electronically Signed   By: Monte Fantasia M.D.   On: 04/08/2020 09:32   CT HEAD WO CONTRAST  Result Date: 04/07/2020 CLINICAL DATA:  Neuro deficit, acute, stroke suspected; new left arm weakness. EXAM: CT HEAD WITHOUT CONTRAST TECHNIQUE: Contiguous axial images were obtained from the base of the skull through the vertex without intravenous contrast. COMPARISON:  Prior head CT examinations 09/20/2015 and earlier. FINDINGS: Brain: Stable, moderate generalized parenchymal atrophy. Redemonstrated chronic small-vessel infarct within the posterior left centrum semiovale/corona radiata (series 4, image 39). Progressive background moderate ill-defined hypoattenuation within the cerebral white matter is nonspecific, but consistent with chronic small vessel ischemic disease. No acute demarcated cortical infarct is identified. A small chronic cortically based infarct is questioned within the right occipital lobe. There is no acute intracranial hemorrhage. No extra-axial fluid collection. No evidence of intracranial mass. No midline shift. Vascular: No hyperdense vessel.  Atherosclerotic calcifications Skull: Normal. Negative for fracture or focal lesion. Sinuses/Orbits: Visualized orbits show no acute finding. Mild ethmoid sinus mucosal thickening. No significant mastoid effusion. IMPRESSION: No CT evidence of acute intracranial abnormality. A small chronic cortically based infarct is questioned within the right occipital lobe. Progressive moderate chronic small vessel ischemic disease. Stable, moderate generalized parenchymal atrophy. Mild  ethmoid sinus mucosal thickening. Electronically Signed   By: Kellie Simmering DO   On: 04/07/2020 09:23   US Carotid Bilateral  Result Date: 04/08/2020 CLINICAL DATA:  Stroke-like symptoms EXAM: BILATERAL CAROTID DUPLEX ULTRASOUND TECHNIQUE: Pearline Cables scale imaging, color Doppler and duplex ultrasound were performed of bilateral carotid and vertebral arteries in the neck. COMPARISON:  Prior duplex carotid ultrasound 10/15/2019 FINDINGS: Criteria: Quantification of carotid stenosis is based on velocity parameters that correlate the residual internal carotid diameter with NASCET-based stenosis levels, using the diameter of the distal internal carotid lumen as the denominator for stenosis measurement. The following velocity measurements were obtained: RIGHT ICA: 142/14 cm/sec CCA: 99/3 cm/sec SYSTOLIC ICA/CCA RATIO:  1.9 ECA:  75 cm/sec LEFT ICA: 69/9 cm/sec CCA: 71/6 cm/sec SYSTOLIC ICA/CCA RATIO:  1.2 ECA:  70 cm/sec RIGHT CAROTID ARTERY: Focal heterogeneous atherosclerotic plaque in the proximal internal carotid artery. By peak systolic velocity criteria, the estimated stenosis falls in the 50-69% diameter range. RIGHT VERTEBRAL ARTERY:  Patent with normal antegrade flow. LEFT CAROTID ARTERY: No significant atherosclerotic plaque or evidence of stenosis in the internal carotid artery. LEFT VERTEBRAL ARTERY:  Patent with normal antegrade flow. IMPRESSION: 1. Moderate (50-69%) stenosis proximal right internal carotid artery secondary to heterogenous atherosclerotic plaque. 2. No significant atherosclerotic plaque or evidence of stenosis in the left internal carotid artery. 3. Vertebral arteries remain patent with normal antegrade flow. Signed, Criselda Peaches, MD, [facility] Electronically Signed   By: Jacqulynn Cadet M.D.   On: 04/08/2020 11:12   US  Paracentesis  Result Date: 04/07/2020 EXAM: ULTRASOUND GUIDED  PARACENTESIS MEDICATIONS: None.  COMPLICATIONS: None immediate. PROCEDURE: Informed written consent was obtained from the patient after a discussion of the risks, benefits and alternatives to treatment. A timeout was performed prior to the initiation of the procedure. Initial ultrasound scanning demonstrates a large amount of ascites within the right lower abdominal quadrant. The right lower abdomen was prepped and draped in the usual sterile fashion. 1% lidocaine was used for local anesthesia. Following this, a Yueh catheter was introduced. An ultrasound image was saved for documentation purposes. The paracentesis was performed. The catheter was removed and a dressing was applied. The patient tolerated the procedure well without immediate post procedural complication. Patient received post-procedure intravenous albumin; see nursing notes for details. FINDINGS: A total of approximately 5,270 mL of cloudy fluid was removed. Samples were sent to the laboratory as requested by the clinical team. IMPRESSION: Successful ultrasound-guided paracentesis yielding 5270 mL of peritoneal fluid. Electronically Signed   By: Marcello Moores  Register   On: 04/07/2020 05:10   DG Chest Port 1 View  Result Date: 04/10/2020 CLINICAL DATA:  Shortness of breath. EXAM: PORTABLE CHEST 1 VIEW COMPARISON:  10/06/2019 and 07/17/2016 radiographs. FINDINGS: 1106 hours. Left subclavian pacemaker leads appear unchanged within the right atrium and right ventricle. The heart size is at the upper limits of normal. There is aortic atherosclerosis. There is new mild pulmonary edema with a small left pleural effusion and probable left lower lobe atelectasis. No pneumothorax or acute osseous findings. Telemetry leads overlie the chest. IMPRESSION: New mild pulmonary edema and small left pleural effusion consistent with congestive heart failure. Electronically Signed   By: Richardean Sale M.D.   On: 04/10/2020 11:25   ECHOCARDIOGRAM COMPLETE  Result Date: 04/10/2020    ECHOCARDIOGRAM  REPORT   Patient Name:   Joseph Houston Date of Exam: 04/18/2020 Medical Rec #:  [id number]         Height:       71.0 in Accession #:    [id number]        Weight:       224.0 lb Date of Birth:  1936/05/30        BSA:          2.213 m Patient Age:    36 years          BP:           125/78 mmHg Patient Gender: M                 HR:           64 bpm. Exam Location:  [facility] Procedure: 2D Echo, Cardiac Doppler and Color Doppler Indications:     CHF- acute diastolic 562.56  History:         Patient has no prior history of Echocardiogram examinations.                  COPD; Risk Factors:Hypertension. STEMI.  Sonographer:     Sherrie Sport RDCS (AE) Referring Phys:  [id number] Royce Macadamia AGBATA Diagnosing Phys: Isaias Cowman MD IMPRESSIONS  1. Left ventricular ejection fraction, by estimation, is 50 to 55%. The left ventricle has low normal function. The left ventricle has no regional wall motion abnormalities. Left ventricular diastolic parameters are indeterminate.  2. Right ventricular systolic function is normal. The right ventricular size is normal. There is mildly elevated pulmonary artery systolic pressure.  3. Left atrial size was mild to moderately dilated.  4.  The mitral valve is normal in structure. Mild mitral valve regurgitation. No evidence of mitral stenosis.  5. The aortic valve is normal in structure. Aortic valve regurgitation is mild. No aortic stenosis is present.  6. The inferior vena cava is normal in size with greater than 50% respiratory variability, suggesting right atrial pressure of 3 mmHg. FINDINGS  Left Ventricle: Left ventricular ejection fraction, by estimation, is 50 to 55%. The left ventricle has low normal function. The left ventricle has no regional wall motion abnormalities. The left ventricular internal cavity size was normal in size. There is no left ventricular hypertrophy. Left ventricular diastolic parameters are indeterminate. Right Ventricle: The right ventricular size is normal. No  increase in right ventricular wall thickness. Right ventricular systolic function is normal. There is mildly elevated pulmonary artery systolic pressure. The tricuspid regurgitant velocity is 2.93  m/s, and with an assumed right atrial pressure of 10 mmHg, the estimated right ventricular systolic pressure is 01.7 mmHg. Left Atrium: Left atrial size was mild to moderately dilated. Right Atrium: Right atrial size was normal in size. Pericardium: There is no evidence of pericardial effusion. Mitral Valve: The mitral valve is normal in structure. Normal mobility of the mitral valve leaflets. Mild mitral valve regurgitation. No evidence of mitral valve stenosis. Tricuspid Valve: The tricuspid valve is normal in structure. Tricuspid valve regurgitation is mild . No evidence of tricuspid stenosis. Aortic Valve: The aortic valve is normal in structure. Aortic valve regurgitation is mild. No aortic stenosis is present. Aortic valve mean gradient measures 6.5 mmHg. Aortic valve peak gradient measures 11.0 mmHg. Aortic valve area, by VTI measures 2.01  cm. Pulmonic Valve: The pulmonic valve was normal in structure. Pulmonic valve regurgitation is not visualized. No evidence of pulmonic stenosis. Aorta: The aortic root is normal in size and structure. Venous: The inferior vena cava is normal in size with greater than 50% respiratory variability, suggesting right atrial pressure of 3 mmHg. IAS/Shunts: No atrial level shunt detected by color flow Doppler. Additional Comments: A pacer wire is visualized.  LEFT VENTRICLE PLAX 2D LVIDd:         3.89 cm LVIDs:         2.59 cm LV PW:         2.02 cm LV IVS:        1.67 cm LVOT diam:     2.00 cm LV SV:         62 LV SV Index:   28 LVOT Area:     3.14 cm  RIGHT VENTRICLE RV Basal diam:  4.87 cm RV S prime:     13.70 cm/s TAPSE (M-mode): 3.2 cm LEFT ATRIUM             Index       RIGHT ATRIUM           Index LA diam:        6.10 cm 2.76 cm/m  RA Area:     18.20 cm LA Vol (A2C):    77.5 ml 35.03 ml/m RA Volume:   41.90 ml  18.94 ml/m LA Vol (A4C):   99.8 ml 45.11 ml/m LA Biplane Vol: 88.9 ml 40.18 ml/m  AORTIC VALVE                    PULMONIC VALVE AV Area (Vmax):    1.89 cm     PV Vmax:        0.60 m/s AV Area (Vmean):   1.93  cm     PV Peak grad:   1.4 mmHg AV Area (VTI):     2.01 cm     RVOT Peak grad: 3 mmHg AV Vmax:           165.50 cm/s AV Vmean:          116.000 cm/s AV VTI:            0.308 m AV Peak Grad:      11.0 mmHg AV Mean Grad:      6.5 mmHg LVOT Vmax:         99.80 cm/s LVOT Vmean:        71.100 cm/s LVOT VTI:          0.197 m LVOT/AV VTI ratio: 0.64  AORTA Ao Root diam: 3.60 cm MITRAL VALVE               TRICUSPID VALVE MV Area (PHT): 3.06 cm    TR Peak grad:   34.3 mmHg MV Decel Time: 248 msec    TR Vmax:        293.00 cm/s MV E velocity: 82.00 cm/s                            SHUNTS                            Systemic VTI:  0.20 m                            Systemic Diam: 2.00 cm Isaias Cowman MD Electronically signed by Isaias Cowman MD Signature Date/Time: 04/29/2020/1:33:29 PM    Final     Thrombocytopenia [facility]) #84 year old male patient with multiple medical problems-bladder cancer/chronic DIC is currently admitted to hospital for abdominal pain/ascites possible UTI on antibiotics noted to have worsening thrombocytopenia  #Thrombocytopenia-unclear etiology-differential includes HIT [although no obvious exposure to IV heparin; heparin flushes possibility]; DIC [history of chronic DIC/question related to bladder malignancy]; medication use/antibiotic induced.  #Muscle invasive bladder cancer-s/p chemoradiation-followed by [facility] [s/p therapy May 2021].  ?  Disease progression based on recent CT abdomen pelvis.  Cytology still pending  #Mental status changes-acute stroke/delirium  #Recommendations:  #With regards to thrombocytopenia-recommend holding antibiotics as likely etiology.  Consider checking PT PTT/fibrinogen.  Check HIT antibody panel.    #Regards to bladder malignancy-as patient is not a candidate for cystectomy; overall prognosis is poor-as patient has significantly high chance of recurrent disease either local or metastatic.  Cytology from ascitic fluid is pending.  Thank you Dr.Amery for allowing me to participate in the care of your pleasant patient. Please do not hesitate to contact me with questions or concerns in the interim.  Addendum: As per the communication from Dr.Sahar-patient's family decided to proceed with comfort care given his complicated medical history/and also worsening neurologic status.   All questions were answered. The patient knows to call the clinic with any problems, questions or concerns.    Cammie Sickle, MD 04/10/2020 5:09 PM

## 2020-04-10 NOTE — Assessment & Plan Note (Signed)
#  84 year old male patient with multiple medical problems-bladder cancer/chronic DIC is currently admitted to hospital for abdominal pain/ascites possible UTI on antibiotics noted to have worsening thrombocytopenia  #Thrombocytopenia-unclear etiology-differential includes HIT [although no obvious exposure to IV heparin; heparin flushes possibility]; DIC [history of chronic DIC/question related to bladder malignancy]; medication use/antibiotic induced.  #Muscle invasive bladder cancer-s/p chemoradiation-followed by [facility] [s/p therapy May 2021].  ?  Disease progression based on recent CT abdomen pelvis.  Cytology still pending  #Mental status changes-acute stroke/delirium  #Recommendations:  #With regards to thrombocytopenia-recommend holding antibiotics as likely etiology.  Consider checking PT PTT/fibrinogen.  Check HIT antibody panel.   #Regards to bladder malignancy-as patient is not a candidate for cystectomy; overall prognosis is poor-as patient has significantly high chance of recurrent disease either local or metastatic.  Cytology from ascitic fluid is pending.  Thank you Dr.Amery for allowing me to participate in the care of your pleasant patient. Please do not hesitate to contact me with questions or concerns in the interim.  Addendum: As per the communication from Dr.Sahar-patient's family decided to proceed with comfort care given his complicated medical history/and also worsening neurologic status.

## 2020-04-10 NOTE — Progress Notes (Signed)
PROGRESS NOTE    Joseph Houston  OFB:[id number] DOB: 12-15-1935 DOA: 04/10/2020 PCP: Tracie Harrier, MD    Brief Narrative:  Joseph Houston is a 84 y.o. male with medical history significant for high-grade urothelial carcinoma of the bladder status post chemo and radiation therapy, history of A. fib on anticoagulation, history of coronary artery disease, hypertension, chronic kidney disease stage III who presents to the emergency room for evaluation of generalized weakness which has progressively worsened over the last couple of weeks and now patient has to use a cane for ambulation to reduce his risk for falls because he is unsteady on his feet.  He complains of abdominal pain which is diffuse but mostly in the periumbilical area associated with nausea and vomiting but denies having any changes in his bowel habits.  He has shortness of breath with exertion but denies having any chest pain, no cough, no fever, no chills.  He also stated that he has not voided in the last 12 hours and a bladder scan revealed 900 cc of urine. Labs revealed sodium 133, potassium 4.1, chloride 102, bicarb 20, BUN 29, creatinine 2.1 compared to baseline of 1.36, AST 23, ALT 18, lactic acid 1.5, hemoglobin 12.9, hematocrit 39.7, platelet count 298. CT scan of abdomen and pelvis showed large volume ascites which has seemingly increased from an outside CT report June 2021. There could be underlying liver cirrhosis.     Consultants:   Neurology, nephrology  Procedures: CT, status post paracentesis  Antimicrobials:   Ceftriaxone   Subjective: A.m. patient remains confused.  Respiratory rate was high.  He was wheezing.  Nursing at bedside rapid response at bedside.  Wife at bedside  Objective: Vitals:   04/09/20 2001 04/10/20 0441 04/10/20 1045 04/10/20 1112  BP: (!) 119/52 (!) 143/76 134/73   Pulse: 82 (!) 109 100   Resp: (!) 22 20 (!) 22   Temp: (!) 97.5 F (36.4 C) 97.7 F (36.5 C) 97.7 F (36.5 C)     TempSrc: Axillary Oral Oral   SpO2: 94% 94% 94% 95%  Weight:      Height:        Intake/Output Summary (Last 24 hours) at 04/10/2020 1639 Last data filed at 04/10/2020 7824 Gross per 24 hour  Intake 206.84 ml  Output 150 ml  Net 56.84 ml   Filed Weights   04/05/20 1301 04/07/20 1435  Weight: 101.6 kg 95.5 kg    Examination: Confused, lethargic Positive wheezing with expiration Reg-irreg tachy s1/s2 Soft, mild distentiion, nt +bs +pitting edema b/l Foley in place  Data Reviewed: I have personally reviewed following labs and imaging studies  CBC: Recent Labs  Lab 04/05/20 1305 04/07/20 0646 04/08/20 0428 04/10/20 0723  WBC 6.4 6.9 8.0 9.7  HGB 12.9* 12.6* 12.3* 12.5*  HCT 39.7 38.3* 37.5* 36.9*  MCV 93.9 93.0 93.5 91.6  PLT 298 229 175 63*   Basic Metabolic Panel: Recent Labs  Lab 04/05/20 1305 04/07/20 0646 04/08/20 0428 04/09/20 0805 04/10/20 0723  NA 133* 136 134* 134* 138  K 4.1 4.0 3.7 4.0 3.8  CL 102 105 101 102 107  CO2 20* 21* 24 23 22   GLUCOSE 121* 95 142* 129* 120*  BUN 39* 37* 31* 28* 30*  CREATININE 2.11* 1.89* 1.69* 1.56* 1.72*  CALCIUM 8.4* 8.5* 8.2* 8.0* 8.2*   GFR: Estimated Creatinine Clearance: 39.7 mL/min (A) (by C-G formula based on SCr of 1.72 mg/dL (H)). Liver Function Tests: Recent Labs  Lab 05/04/2020 0158  AST 23  ALT 18  ALKPHOS 44  BILITOT 0.8  PROT 5.7*  ALBUMIN 2.7*   Recent Labs  Lab 04/20/2020 0158  LIPASE 27   Recent Labs  Lab 04/09/20 1228  AMMONIA 15   Coagulation Profile: No results for input(s): INR, PROTIME in the last 168 hours. Cardiac Enzymes: No results for input(s): CKTOTAL, CKMB, CKMBINDEX, TROPONINI in the last 168 hours. BNP (last 3 results) No results for input(s): PROBNP in the last 8760 hours. HbA1C: Recent Labs    04/09/20 0805  HGBA1C 5.4   CBG: No results for input(s): GLUCAP in the last 168 hours. Lipid Profile: Recent Labs    04/09/20 0805  CHOL 123  HDL 54  LDLCALC 53   TRIG 79  CHOLHDL 2.3   Thyroid Function Tests: No results for input(s): TSH, T4TOTAL, FREET4, T3FREE, THYROIDAB in the last 72 hours. Anemia Panel: No results for input(s): VITAMINB12, FOLATE, FERRITIN, TIBC, IRON, RETICCTPCT in the last 72 hours. Sepsis Labs: Recent Labs  Lab 04/18/2020 0200  LATICACIDVEN 1.5    Recent Results (from the past 240 hour(s))  Urine Culture     Status: Abnormal   Collection Time: 04/05/20  1:05 PM   Specimen: Urine, Clean Catch  Result Value Ref Range Status   Specimen Description   Final    URINE, CLEAN CATCH Performed at [facility], [address] [place], [place] [address]    Special Requests   Final    Normal Performed at [facility], [address] [place], [place] [address]    Culture (A)  Final    <10,000 COLONIES/mL INSIGNIFICANT GROWTH Performed at [facility], [place] [address] [place], [place] [address]    Report Status 04/05/2020 FINAL  Final  SARS Coronavirus 2 by RT PCR (hospital order, performed in [facility] hospital lab) Nasopharyngeal Nasopharyngeal Swab     Status: None   Collection Time: 04/22/2020  2:01 AM   Specimen: Nasopharyngeal Swab  Result Value Ref Range Status   SARS Coronavirus 2 NEGATIVE NEGATIVE Final    Comment: (NOTE) SARS-CoV-2 target nucleic acids are NOT DETECTED.  The SARS-CoV-2 RNA is generally detectable in upper and lower respiratory specimens during the acute phase of infection. The lowest concentration of SARS-CoV-2 viral copies this assay can detect is 250 copies / mL. A negative result does not preclude SARS-CoV-2 infection and should not be used as the sole basis for treatment or other patient management decisions.  A negative result may occur with improper specimen collection / handling, submission of specimen other than nasopharyngeal swab, presence of viral mutation(s) within the areas targeted by this assay, and inadequate number of viral copies (<250 copies /  mL). A negative result must be combined with clinical observations, patient history, and epidemiological information.  Fact Sheet for Patients:   StrictlyIdeas.no  Fact Sheet for Healthcare Providers: BankingDealers.co.za  This test is not yet approved or  cleared by the [place] FDA and has been authorized for detection and/or diagnosis of SARS-CoV-2 by FDA under an Emergency Use Authorization (EUA).  This EUA will remain in effect (meaning this test can be used) for the duration of the COVID-19 declaration under Section 564(b)(1) of the Act, 21 U.S.C. section 360bbb-3(b)(1), unless the authorization is terminated or revoked sooner.  Performed at [facility], [address], [place], [place] [address]   Body fluid culture     Status: None   Collection Time: 04/09/2020  4:35 PM  Specimen: PATH Cytology Peritoneal fluid  Result Value Ref Range Status   Specimen Description   Final    PERITONEAL Performed at [facility], [address], [place], [place] [address]    Special Requests   Final    NONE Performed at [facility], [address] [place], [place] [address]    Gram Stain   Final    FEW WBC PRESENT, PREDOMINANTLY MONONUCLEAR NO ORGANISMS SEEN    Culture   Final    NO GROWTH 3 DAYS Performed at [facility], [place] [address], [place], [place] [address]    Report Status 04/10/2020 FINAL  Final  CULTURE, BLOOD (ROUTINE X 2) w Reflex to ID Panel     Status: None (Preliminary result)   Collection Time: 04/07/20  3:01 PM   Specimen: BLOOD  Result Value Ref Range Status   Specimen Description BLOOD RIGHT [facility] [place]  Final   Special Requests   Final    BOTTLES DRAWN AEROBIC AND ANAEROBIC Blood Culture adequate volume   Culture   Final    NO GROWTH 3 DAYS Performed at [facility], [address] [place], [place] [address]    Report Status PENDING  Incomplete  CULTURE, BLOOD  (ROUTINE X 2) w Reflex to ID Panel     Status: None (Preliminary result)   Collection Time: 04/07/20  3:53 PM   Specimen: BLOOD  Result Value Ref Range Status   Specimen Description BLOOD RIGHT ANTECUBITAL  Final   Special Requests   Final    BOTTLES DRAWN AEROBIC AND ANAEROBIC Blood Culture adequate volume   Culture   Final    NO GROWTH 3 DAYS Performed at [facility], [address] [place], [place] [address]    Report Status PENDING  Incomplete  Urine Culture     Status: None   Collection Time: 04/09/20 11:45 AM   Specimen: Urine, Random  Result Value Ref Range Status   Specimen Description   Final    URINE, RANDOM Performed at [facility], [address] [place], [place] [address]    Special Requests   Final    NONE Performed at [facility], [address], [place],  [address]    Culture   Final    NO GROWTH Performed at [facility], [place] [address], [place],  [address]    Report Status 04/10/2020 FINAL  Final         Radiology Studies: CT HEAD WO CONTRAST  Result Date: 04/09/2020 CLINICAL DATA:  Continue confusion.  Stroke follow-up. EXAM: CT HEAD WITHOUT CONTRAST TECHNIQUE: Contiguous axial images were obtained from the base of the skull through the vertex without intravenous contrast. COMPARISON:  Head CT, 04/08/2020 FINDINGS: Brain: Small area of right posterior frontal lobe infarction is unchanged from the previous day's exam. There is also a small area of hypoattenuation involving the gray-white junction of the posterior right occipital/parietal lobe. No other evidence of recent infarction. No hydrocephalus and no intracranial hemorrhage. Ventricles and sulci are enlarged reflecting mild diffuse atrophy. There are patchy areas of white matter hypoattenuation consistent with chronic microvascular ischemic change. Small old cerebellar infarcts. Vascular: No hyperdense vessel or unexpected calcification. Skull: Normal.  Negative for fracture or focal lesion. Sinuses/Orbits: No acute finding. Other: None IMPRESSION: 1. No interval change from the previous day's head CT. Small area of right MCA distribution infarction along the right posterosuperior frontal cortex. There is also a small area of  possible recent infarction involving the posterior right occipital/parietal lobe. 2. No new areas of infarction and no intracranial hemorrhage. No hydrocephalus. Electronically Signed   By: Lajean Manes M.D.   On: 04/09/2020 14:50   DG Chest Port 1 View  Result Date: 04/10/2020 CLINICAL DATA:  Shortness of breath. EXAM: PORTABLE CHEST 1 VIEW COMPARISON:  10/06/2019 and 07/17/2016 radiographs. FINDINGS: 1106 hours. Left subclavian pacemaker leads appear unchanged within the right atrium and right ventricle. The heart size is at the upper limits of normal. There is aortic atherosclerosis. There is new mild pulmonary edema with a small left pleural effusion and probable left lower lobe atelectasis. No pneumothorax or acute osseous findings. Telemetry leads overlie the chest. IMPRESSION: New mild pulmonary edema and small left pleural effusion consistent with congestive heart failure. Electronically Signed   By: Richardean Sale M.D.   On: 04/10/2020 11:25        Scheduled Meds: . sodium chloride flush  3 mL Intravenous Q12H   Continuous Infusions: . sodium chloride Stopped (04/10/20 0123)  . morphine 5 mg/hr (04/10/20 1244)    Assessment & Plan:   Principal Problem:   UTI (urinary tract infection) Active Problems:   A-fib ([place])   Bladder carcinoma ([place])   AKI (acute kidney injury) ([place])   Coronary artery disease   Acute urinary retention   Anasarca   Ascites  Acute ischemic stroke-with LUE flaccid.  Likely etiology is cardioembolic due to A. fib with recent discontinuation of his anticoagulation. Neurology was consulted. Stat CT of the head was negative for bleed Unable to do MRI or MRA due to pacemaker Per  neurology okay to continue Plavix and high intensity statin Permissive hypertension up to 180 210 mmHg initially 9/4 . Confused today. Repeat CT of the head this a.m. reveals moderate acute infarct and couple other small infarcts please see full report Per neurology okay to resume Eliquis tomorrow, approximately 3 days after stroke to minimize risk of hemorrhagic conversion. 9/5-still confused, maybe bit more. Possibly delirium.  However since resuming Eliquis will obtain CT of the head and I chatted Dr. Malen Gauze neurology he was okay with this plan.  Repeat CT today no new infarction, ok to resume Eliquis per neuro via chat. 1:1 sit. I discussed with nurse kelly. Continue statin bp goal <140/90 LDL goal less than 70, currently at goal of 53 Check A1c Frequent neuro checks Speech eval completed mechanical soft diet consistent with gravies please see speech report PT OT recommends CIR 9/6: Patient remains confused lethargic.  Wife at bedside.  I spoke extensively with the wife and daughter and as family they have decided to make patient DNR and after much discussion they would like the patient to be transition to comfort care measures.  All labs and medications to be discontinued and be placed on IV morphine drip for comfort.   Urinary tract infection with acute urinary retention-on admission Patient complained of difficulty voiding and bladder scan reveals > 900cc of urine Patient also has pyuria Foley was placed on admission Urine culture less than 10,000 colonies 9/5 was on Rocephin  Empirically, but since treating for peritonitis switched her to cefuroxime 9/6 will discontinue all antibiotics Comfort care initiated     Anasarca Unclear etiology possibly cardiorenal source Echo EF 50 to 55% Holding Lasix and ACE for now We will check prealbumin, may benefit from albumin infusion Nephrology following 9/6 we will hold off on prealbumin since patient started on comfort  measures   AKI-AKI possibly  secondary to acute hepatorenal syndrome versus prerenal azotemia At baseline patient has a serum creatinine of 1.36 >> 2.11..>>>1.69 Imaging is negative for obstructive uropathy 9/6 patient started on comfort measures, no further labs need to be checked      Ascites-New onset No known history of liver cirrhosis Patient has a history of bladder cancer, ??  Ascites secondary to metastatic disease S/p 5 L of cloudy fluid removed during paracentesis.  White blood cell found on fluid culture.   Final cultures no growth  9/6 all antibiotics discontinued as patient is transition to comfort measures    History of atrial fibrillation Rate controlled Patient is on apixaban as primary prophylaxis for an acute stroke Eliquis was held for paracentesis.  Patient had acute stroke and we had to hold his Eliquis.  I am unsure if he takes it at home.  I spoke to his son but he was not sure.  Will need to discuss his compliance with his wife tomorrow.  Again he was held to prevent his acute stroke converting to hemorrhagic stroke.   Cleared by neurology to resume, will resume Eliquis 2.5 twice daily  Cardiology was consulted since patient had a recent MI and stent back in March.  They agree patient to take Eliquis 2.5 mg twice daily with Plavix 75 daily and holding aspirin. 9/6 patient found with more drop and his platelets.  I did discuss this with the daughter and wife.  Eliquis was held.  Now transition to comfort measures     History of coronary artery disease Status post stent angioplasty in 03/21 All medications discontinued as patient is transition to comfort measure   History of bladder cancer Patient was diagnosed with high-grade urothelial carcinoma and is status post radiation and chemotherapy Comfort care measures initiated   DVT prophylaxis: SCD Code Status: Full Family Communication: Wife at bedside which was updated and daughter via  phone  Status is: Inpatient  Remains inpatient appropriate because:Inpatient level of care appropriate due to severity of illness as imminent death pending   Dispo: The patient is from: Home              Anticipated d/c is to: Imminent death, patient on IV morphine              Anticipated d/c date is: 1 day likely              Patient currently comfort care on morphine gtt pending imminent expiration. Likely during this hospitiaization.        LOS: 4 days   Time spent: 35 minutes with more than 50% on [place], MD [facility] Pager [phone]  If 7PM-7AM, please contact night-coverage [email or web address] Password [facility] 04/10/2020, 4:39 PM

## 2020-04-10 NOTE — Progress Notes (Signed)
Inpatient Rehab Admissions Coordinator:   Pt. Is currently only tolerating bed-level exercises. I will continue to follow for progress with therapies to determine if pt is able to tolerate CIR-level therapies.   Clemens Catholic, [place], [place] Admissions Coordinator  [phone] ([place]) [phone] (office)

## 2020-04-11 DIAGNOSIS — I4811 Longstanding persistent atrial fibrillation: Secondary | ICD-10-CM

## 2020-04-11 LAB — CYTOLOGY - NON PAP

## 2020-04-12 LAB — CULTURE, BLOOD (ROUTINE X 2)
Culture: NO GROWTH
Culture: NO GROWTH
Special Requests: ADEQUATE
Special Requests: ADEQUATE

## 2020-04-12 LAB — HEPARIN INDUCED PLATELET AB (HIT ANTIBODY): Heparin Induced Plt Ab: 0.073 OD (ref 0.000–0.400)

## 2020-04-13 LAB — TOTAL BILIRUBIN, BODY FLUID: Total bilirubin, fluid: 0.2 mg/dL

## 2020-05-05 NOTE — Death Summary Note (Addendum)
Death Summary  Joseph Houston YBO:[id number] DOB: Jan 13, 1936 DOA: 2020-04-13  PCP: Tracie Harrier, MD  Admit date: 04/13/20 Date of Death: 04/18/2020 Time of Death: 08:08 am Notification: Tracie Harrier, MD notified of death of 04/18/2020   History of present illness:  Joseph Houston is a 84 y.o. male with medical history significant for high-grade urothelial carcinoma of the bladder status post chemotherapy and radiation therapy, history of atrial fibrillation on anticoagulation, history of coronary artery disease, hypertension, chronic kidney disease stage III who presented to the emergency room for evaluation of generalized weakness which has progressively worsened over the last couple of weeks and patient has to use a cane for ambulation to reduce his risk for falls because he was unsteady on his feet.  He complained of abdominal pain associated with nausea and vomiting but denies having any changes in his bowel habits.  He was shortness of breath with exertion but denied having any chest pain, no cough, no fever, no chills.  He had stated that he had not voided in the last 12 hours and a bladder scan revealed 900 cc of urine.CT scan of abdomen and pelvis showed large volume ascites which has seemingly increased from an outside CT report June 2021.  Clinically he was found with ascites and anasarca.  He was admitted to the hospital.  His Eliquis was not started for paracentesis.  His anasarca etiology was unclear possibly cardiorenal source.  He had an echo that revealed EF of 50 to 55%.  Underwent paracentesis with 5 L of cloudy fluid removed during paracentesis.  The white count was positive on the fluid culture and prior to the final results he was started on IV antibiotics for possible peritonitis.  Nephrology was consulted as his kidney function was compromised to.  Imaging was negative for obstructive uropathy.  The next morning after admission patient complained of not being able to  move his left arm.  CT of the head was negative.  We were unable to do MRI or MRA due to him having a pacemaker.  Neurology was consulted.  He had a repeat CT the following morning revealing moderate acute infarct with full results below.  Unfortunately he remained confused after his stroke.  He had decreased p.o. intake.  He was unable to participate or interact while in hospital and was lethargic.  He was having decreased urinary output and also some increased respiratory .  Also his platelets count were dropped with unclear etiology differential included possibly due to medication versus HIT although no obvious exposure to IV heparin, DIC.  Versus since clinically and medically he was not doing well with poor prognosis family did not want any further medical treatment and had made the patient DNR and placed him on comfort care.   Final Diagnoses:  1.  Acute ischemic stroke 2.  Anasarca and ascites 3.  History of atrial fibrillation 4.  Thrombocytopenia 5.  Acute urinary retention with urinary tract infection 6.  AKI 7.  Coronary artery disease 8.  History of bladder cancer 9.  Hyponatremia 10.  Hyperglycemia 11.  Altered mental status 12.  Hypoalbuminemia 13.  Decreased p.o. intake 14.  Anemia    The results of significant diagnostics from this hospitalization (including imaging, microbiology, ancillary and laboratory) are listed below for reference.    Significant Diagnostic Studies: CT ABDOMEN PELVIS WO CONTRAST  Result Date: 04-13-2020 CLINICAL DATA:  Abdominal distension and nonlocalized abdominal pain. EXAM: CT ABDOMEN AND PELVIS WITHOUT CONTRAST TECHNIQUE: Multidetector CT  imaging of the abdomen and pelvis was performed following the standard protocol without IV contrast. COMPARISON:  09/06/2019 and abdominal CT report from [facility] 01/14/2020 FINDINGS: Lower chest: Generous heart size. Dual-chamber pacer leads. Small pleural effusions on the left more than right with calcified pleural  plaques. Hepatobiliary: Small appearance of the liver with some surface lobulation and caudate/fissure enlargement.Mild high-density at the dependent gallbladder without calcified stone or acute inflammatory finding Pancreas: Generalized fatty atrophy. Spleen: Unremarkable. Adrenals/Urinary Tract: Negative adrenals. Cystic densities on both sides of the renal cortex. Anterior and right lateral asymmetric bladder wall thickening. Although imperceptible on axial slices there is an intact posterior bladder wall by reformats Stomach/Bowel: No obstruction. No visible bowel inflammation. Proctitis by recent sigmoidoscopy, unremarkable by CT Vascular/Lymphatic: Multifocal atheromatous calcification. No mass or adenopathy. Reproductive:Mildly enlarged prostate. Other: Large volume ascites. There is reticulation of the greater omentum without nodularity or discrete masslike finding. Bilateral inguinal hernia repair. Musculoskeletal: No acute abnormalities. Lumbar spine degeneration, focally advanced at L4-5. IMPRESSION: 1. Large volume ascites which has seemingly increased from a outside CT report June 2021. There could be underlying liver cirrhosis. At sampling, consider including cytology given history of bladder cancer. 2. Small pleural effusions. 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 Electronically Signed   By: Monte Fantasia M.D.   On: 05/01/2020 05:42   CT HEAD WO CONTRAST  Result Date: 04/09/2020 CLINICAL DATA:  Continue confusion.  Stroke follow-up. EXAM: CT HEAD WITHOUT CONTRAST TECHNIQUE: Contiguous axial images were obtained from the base of the skull through the vertex without intravenous contrast. COMPARISON:  Head CT, 04/08/2020 FINDINGS: Brain: Small area of right posterior frontal lobe infarction is unchanged from the previous day's exam. There is also a small area of hypoattenuation involving the gray-white junction of the posterior right occipital/parietal  lobe. No other evidence of recent infarction. No hydrocephalus and no intracranial hemorrhage. Ventricles and sulci are enlarged reflecting mild diffuse atrophy. There are patchy areas of white matter hypoattenuation consistent with chronic microvascular ischemic change. Small old cerebellar infarcts. Vascular: No hyperdense vessel or unexpected calcification. Skull: Normal. Negative for fracture or focal lesion. Sinuses/Orbits: No acute finding. Other: None IMPRESSION: 1. No interval change from the previous day's head CT. Small area of right MCA distribution infarction along the right posterosuperior frontal cortex. There is also a small area of possible recent infarction involving the posterior right occipital/parietal lobe. 2. No new areas of infarction and no intracranial hemorrhage. No hydrocephalus. Electronically Signed   By: Lajean Manes M.D.   On: 04/09/2020 14:50   CT HEAD WO CONTRAST  Result Date: 04/08/2020 CLINICAL DATA:  Stroke follow-up EXAM: CT HEAD WITHOUT CONTRAST TECHNIQUE: Contiguous axial images were obtained from the base of the skull through the vertex without intravenous contrast. COMPARISON:  Yesterday FINDINGS: Brain: Moderate posterior right frontal infarct with cytotoxic edema obscuring the cortex. There is also a small recent appearing infarct in the right occipital cortex just above a pre-existing cortical infarct. Small right superior cerebellar infarction which is also become apparent since prior. No hemorrhage, hydrocephalus, or collection. Chronic small vessel ischemia in the cerebral white matter. Vascular: No hyperdense vessel.  Atherosclerotic calcification Skull: Normal. Negative for fracture or focal lesion. Sinuses/Orbits: Right cataract resection.  No acute finding IMPRESSION: 1. Moderate acute infarct at the right posterior frontal cortex. 2. Small recent right occipital cortex infarct adjacent to a more chronic infarct. 3. Small acute right superior cerebellar infarct.  Electronically Signed  By: Monte Fantasia M.D.   On: 04/08/2020 09:32   CT HEAD WO CONTRAST  Result Date: 04/07/2020 CLINICAL DATA:  Neuro deficit, acute, stroke suspected; new left arm weakness. EXAM: CT HEAD WITHOUT CONTRAST TECHNIQUE: Contiguous axial images were obtained from the base of the skull through the vertex without intravenous contrast. COMPARISON:  Prior head CT examinations 09/20/2015 and earlier. FINDINGS: Brain: Stable, moderate generalized parenchymal atrophy. Redemonstrated chronic small-vessel infarct within the posterior left centrum semiovale/corona radiata (series 4, image 39). Progressive background moderate ill-defined hypoattenuation within the cerebral white matter is nonspecific, but consistent with chronic small vessel ischemic disease. No acute demarcated cortical infarct is identified. A small chronic cortically based infarct is questioned within the right occipital lobe. There is no acute intracranial hemorrhage. No extra-axial fluid collection. No evidence of intracranial mass. No midline shift. Vascular: No hyperdense vessel.  Atherosclerotic calcifications Skull: Normal. Negative for fracture or focal lesion. Sinuses/Orbits: Visualized orbits show no acute finding. Mild ethmoid sinus mucosal thickening. No significant mastoid effusion. IMPRESSION: No CT evidence of acute intracranial abnormality. A small chronic cortically based infarct is questioned within the right occipital lobe. Progressive moderate chronic small vessel ischemic disease. Stable, moderate generalized parenchymal atrophy. Mild ethmoid sinus mucosal thickening. Electronically Signed   By: Kellie Simmering DO   On: 04/07/2020 09:23   US Carotid Bilateral  Result Date: 04/08/2020 CLINICAL DATA:  Stroke-like symptoms EXAM: BILATERAL CAROTID DUPLEX ULTRASOUND TECHNIQUE: Pearline Cables scale imaging, color Doppler and duplex ultrasound were performed of bilateral carotid and vertebral arteries in the neck. COMPARISON:  Prior  duplex carotid ultrasound 10/15/2019 FINDINGS: Criteria: Quantification of carotid stenosis is based on velocity parameters that correlate the residual internal carotid diameter with NASCET-based stenosis levels, using the diameter of the distal internal carotid lumen as the denominator for stenosis measurement. The following velocity measurements were obtained: RIGHT ICA: 142/14 cm/sec CCA: 77/8 cm/sec SYSTOLIC ICA/CCA RATIO:  1.9 ECA:  75 cm/sec LEFT ICA: 69/9 cm/sec CCA: 24/2 cm/sec SYSTOLIC ICA/CCA RATIO:  1.2 ECA:  70 cm/sec RIGHT CAROTID ARTERY: Focal heterogeneous atherosclerotic plaque in the proximal internal carotid artery. By peak systolic velocity criteria, the estimated stenosis falls in the 50-69% diameter range. RIGHT VERTEBRAL ARTERY:  Patent with normal antegrade flow. LEFT CAROTID ARTERY: No significant atherosclerotic plaque or evidence of stenosis in the internal carotid artery. LEFT VERTEBRAL ARTERY:  Patent with normal antegrade flow. IMPRESSION: 1. Moderate (50-69%) stenosis proximal right internal carotid artery secondary to heterogenous atherosclerotic plaque. 2. No significant atherosclerotic plaque or evidence of stenosis in the left internal carotid artery. 3. Vertebral arteries remain patent with normal antegrade flow. Signed, Criselda Peaches, MD, [facility] Electronically Signed   By: Jacqulynn Cadet M.D.   On: 04/08/2020 11:12   US Paracentesis  Result Date: 04/07/2020 EXAM: ULTRASOUND GUIDED  PARACENTESIS MEDICATIONS: None. COMPLICATIONS: None immediate. PROCEDURE: Informed written consent was obtained from the patient after a discussion of the risks, benefits and alternatives to treatment. A timeout was performed prior to the initiation of the procedure. Initial ultrasound scanning demonstrates a large amount of ascites within the right lower abdominal quadrant. The right lower abdomen was prepped and draped in  the usual sterile fashion. 1% lidocaine was used for local anesthesia. Following this, a Yueh catheter was introduced. An ultrasound image was saved for documentation purposes. The paracentesis was performed. The catheter was removed and a dressing was applied. The patient tolerated the procedure well without immediate post procedural complication.  Patient received post-procedure intravenous albumin; see nursing notes for details. FINDINGS: A total of approximately 5,270 mL of cloudy fluid was removed. Samples were sent to the laboratory as requested by the clinical team. IMPRESSION: Successful ultrasound-guided paracentesis yielding 5270 mL of peritoneal fluid. Electronically Signed   By: Marcello Moores  Register   On: 04/07/2020 05:10   DG Chest Port 1 View  Result Date: 04/10/2020 CLINICAL DATA:  Shortness of breath. EXAM: PORTABLE CHEST 1 VIEW COMPARISON:  10/06/2019 and 07/17/2016 radiographs. FINDINGS: 1106 hours. Left subclavian pacemaker leads appear unchanged within the right atrium and right ventricle. The heart size is at the upper limits of normal. There is aortic atherosclerosis. There is new mild pulmonary edema with a small left pleural effusion and probable left lower lobe atelectasis. No pneumothorax or acute osseous findings. Telemetry leads overlie the chest. IMPRESSION: New mild pulmonary edema and small left pleural effusion consistent with congestive heart failure. Electronically Signed   By: Richardean Sale M.D.   On: 04/10/2020 11:25   ECHOCARDIOGRAM COMPLETE  Result Date: 04/14/2020    ECHOCARDIOGRAM REPORT   Patient Name:   Joseph Houston Date of Exam: 04/08/2020 Medical Rec #:  [id number]         Height:       71.0 in Accession #:    [id number]        Weight:       224.0 lb Date of Birth:  [birth year]        BSA:          2.213 m Patient Age:    89 years          BP:           125/78 mmHg Patient Gender: M                 HR:           64 bpm. Exam Location:  [facility] Procedure: 2D Echo, Cardiac  Doppler and Color Doppler Indications:     CHF- acute diastolic 829.56  History:         Patient has no prior history of Echocardiogram examinations.                  COPD; Risk Factors:Hypertension. STEMI.  Sonographer:     Sherrie Sport RDCS (AE) Referring Phys:  [id number] Royce Macadamia AGBATA Diagnosing Phys: Isaias Cowman MD IMPRESSIONS  1. Left ventricular ejection fraction, by estimation, is 50 to 55%. The left ventricle has low normal function. The left ventricle has no regional wall motion abnormalities. Left ventricular diastolic parameters are indeterminate.  2. Right ventricular systolic function is normal. The right ventricular size is normal. There is mildly elevated pulmonary artery systolic pressure.  3. Left atrial size was mild to moderately dilated.  4. The mitral valve is normal in structure. Mild mitral valve regurgitation. No evidence of mitral stenosis.  5. The aortic valve is normal in structure. Aortic valve regurgitation is mild. No aortic stenosis is present.  6. The inferior vena cava is normal in size with greater than 50% respiratory variability, suggesting right atrial pressure of 3 mmHg. FINDINGS  Left Ventricle: Left ventricular ejection fraction, by estimation, is 50 to 55%. The left ventricle has low normal function. The left ventricle has no regional wall motion abnormalities. The left ventricular internal cavity size was normal in size. There is no left ventricular hypertrophy. Left ventricular diastolic parameters are indeterminate. Right Ventricle: The right ventricular size is normal. No increase in  right ventricular wall thickness. Right ventricular systolic function is normal. There is mildly elevated pulmonary artery systolic pressure. The tricuspid regurgitant velocity is 2.93  m/s, and with an assumed right atrial pressure of 10 mmHg, the estimated right ventricular systolic pressure is 16.1 mmHg. Left Atrium: Left atrial size was mild to moderately dilated. Right Atrium: Right  atrial size was normal in size. Pericardium: There is no evidence of pericardial effusion. Mitral Valve: The mitral valve is normal in structure. Normal mobility of the mitral valve leaflets. Mild mitral valve regurgitation. No evidence of mitral valve stenosis. Tricuspid Valve: The tricuspid valve is normal in structure. Tricuspid valve regurgitation is mild . No evidence of tricuspid stenosis. Aortic Valve: The aortic valve is normal in structure. Aortic valve regurgitation is mild. No aortic stenosis is present. Aortic valve mean gradient measures 6.5 mmHg. Aortic valve peak gradient measures 11.0 mmHg. Aortic valve area, by VTI measures 2.01  cm. Pulmonic Valve: The pulmonic valve was normal in structure. Pulmonic valve regurgitation is not visualized. No evidence of pulmonic stenosis. Aorta: The aortic root is normal in size and structure. Venous: The inferior vena cava is normal in size with greater than 50% respiratory variability, suggesting right atrial pressure of 3 mmHg. IAS/Shunts: No atrial level shunt detected by color flow Doppler. Additional Comments: A pacer wire is visualized.  LEFT VENTRICLE PLAX 2D LVIDd:         3.89 cm LVIDs:         2.59 cm LV PW:         2.02 cm LV IVS:        1.67 cm LVOT diam:     2.00 cm LV SV:         62 LV SV Index:   28 LVOT Area:     3.14 cm  RIGHT VENTRICLE RV Basal diam:  4.87 cm RV S prime:     13.70 cm/s TAPSE (M-mode): 3.2 cm LEFT ATRIUM             Index       RIGHT ATRIUM           Index LA diam:        6.10 cm 2.76 cm/m  RA Area:     18.20 cm LA Vol (A2C):   77.5 ml 35.03 ml/m RA Volume:   41.90 ml  18.94 ml/m LA Vol (A4C):   99.8 ml 45.11 ml/m LA Biplane Vol: 88.9 ml 40.18 ml/m  AORTIC VALVE                    PULMONIC VALVE AV Area (Vmax):    1.89 cm     PV Vmax:        0.60 m/s AV Area (Vmean):   1.93 cm     PV Peak grad:   1.4 mmHg AV Area (VTI):     2.01 cm     RVOT Peak grad: 3 mmHg AV Vmax:           165.50 cm/s AV Vmean:          116.000 cm/s  AV VTI:            0.308 m AV Peak Grad:      11.0 mmHg AV Mean Grad:      6.5 mmHg LVOT Vmax:         99.80 cm/s LVOT Vmean:        71.100 cm/s LVOT VTI:  0.197 m LVOT/AV VTI ratio: 0.64  AORTA Ao Root diam: 3.60 cm MITRAL VALVE               TRICUSPID VALVE MV Area (PHT): 3.06 cm    TR Peak grad:   34.3 mmHg MV Decel Time: 248 msec    TR Vmax:        293.00 cm/s MV E velocity: 82.00 cm/s                            SHUNTS                            Systemic VTI:  0.20 m                            Systemic Diam: 2.00 cm Isaias Cowman MD Electronically signed by Isaias Cowman MD Signature Date/Time: 04/30/2020/1:33:29 PM    Final     Microbiology: Recent Results (from the past 240 hour(s))  Urine Culture     Status: Abnormal   Collection Time: 04/05/20  1:05 PM   Specimen: Urine, Clean Catch  Result Value Ref Range Status   Specimen Description   Final    URINE, CLEAN CATCH Performed at [facility], [address], [place], [place] [address]    Special Requests   Final    Normal Performed at [facility], [address] [place], [place] [address]    Culture (A)  Final    <10,000 COLONIES/mL INSIGNIFICANT GROWTH Performed at [facility], [place] [address], [place], [place] [address]    Report Status 04/12/2020 FINAL  Final  SARS Coronavirus 2 by RT PCR (hospital order, performed in [facility] hospital lab) Nasopharyngeal Nasopharyngeal Swab     Status: None   Collection Time: 04/21/2020  2:01 AM   Specimen: Nasopharyngeal Swab  Result Value Ref Range Status   SARS Coronavirus 2 NEGATIVE NEGATIVE Final    Comment: (NOTE) SARS-CoV-2 target nucleic acids are NOT DETECTED.  The SARS-CoV-2 RNA is generally detectable in upper and lower respiratory specimens during the acute phase of infection. The lowest concentration of SARS-CoV-2 viral copies this assay can detect is 250 copies / mL. A negative result does not preclude SARS-CoV-2 infection and  should not be used as the sole basis for treatment or other patient management decisions.  A negative result may occur with improper specimen collection / handling, submission of specimen other than nasopharyngeal swab, presence of viral mutation(s) within the areas targeted by this assay, and inadequate number of viral copies (<250 copies / mL). A negative result must be combined with clinical observations, patient history, and epidemiological information.  Fact Sheet for Patients:   StrictlyIdeas.no  Fact Sheet for Healthcare Providers: BankingDealers.co.za  This test is not yet approved or  cleared by the [place] FDA and has been authorized for detection and/or diagnosis of SARS-CoV-2 by FDA under an Emergency Use Authorization (EUA).  This EUA will remain in effect (meaning this test can be used) for the duration of the COVID-19 declaration under Section 564(b)(1) of the Act, 21 U.S.C. section 360bbb-3(b)(1), unless the authorization is terminated or revoked sooner.  Performed at [facility], [address], [place], [place] [address]   Body fluid culture     Status: None   Collection Time: 04/23/2020  4:35 PM  Specimen: PATH Cytology Peritoneal fluid  Result Value Ref Range Status   Specimen Description   Final    PERITONEAL Performed at [facility], [address], [place], [place] [address]    Special Requests   Final    NONE Performed at [facility], [address] [place], [place] [address]    Gram Stain   Final    FEW WBC PRESENT, PREDOMINANTLY MONONUCLEAR NO ORGANISMS SEEN    Culture   Final    NO GROWTH 3 DAYS Performed at [facility], [place] [address] [place], [place] [address]    Report Status 04/10/2020 FINAL  Final  CULTURE, BLOOD (ROUTINE X 2) w Reflex to ID Panel     Status: None (Preliminary result)   Collection Time: 04/07/20  3:01 PM   Specimen: BLOOD    Result Value Ref Range Status   Specimen Description BLOOD RIGHT [facility]  Final   Special Requests   Final    BOTTLES DRAWN AEROBIC AND ANAEROBIC Blood Culture adequate volume   Culture   Final    NO GROWTH 4 DAYS Performed at [facility], [address], [place], [place] [address]    Report Status PENDING  Incomplete  CULTURE, BLOOD (ROUTINE X 2) w Reflex to ID Panel     Status: None (Preliminary result)   Collection Time: 04/07/20  3:53 PM   Specimen: BLOOD  Result Value Ref Range Status   Specimen Description BLOOD RIGHT ANTECUBITAL  Final   Special Requests   Final    BOTTLES DRAWN AEROBIC AND ANAEROBIC Blood Culture adequate volume   Culture   Final    NO GROWTH 4 DAYS Performed at [facility], [address], [place], [place] [address]    Report Status PENDING  Incomplete  Urine Culture     Status: None   Collection Time: 04/09/20 11:45 AM   Specimen: Urine, Random  Result Value Ref Range Status   Specimen Description   Final    URINE, RANDOM Performed at [facility], [address], [place], [place] [address]    Special Requests   Final    NONE Performed at [facility], [address] [place], [place] [address]    Culture   Final    NO GROWTH Performed at [facility], [place] [address] [place], [place] [address]    Report Status 04/10/2020 FINAL  Final     Labs: Basic Metabolic Panel: Recent Labs  Lab 04/05/20 1305 04/05/20 1305 04/07/20 4034 04/07/20 7425 04/08/20 0428 04/08/20 0428 04/09/20 0805 04/10/20 0723  NA 133*  --  136  --  134*  --  134* 138  K 4.1   < > 4.0   < > 3.7   < > 4.0 3.8  CL 102  --  105  --  101  --  102 107  CO2 20*  --  21*  --  24  --  23 22  GLUCOSE 121*  --  95  --  142*  --  129* 120*  BUN 39*  --  37*  --  31*  --  28* 30*  CREATININE 2.11*  --  1.89*  --  1.69*  --  1.56* 1.72*  CALCIUM 8.4*  --  8.5*  --  8.2*  --  8.0* 8.2*   < > = values in this interval not displayed.    Liver Function Tests: Recent Labs  Lab  04/10/2020 0158  AST 23  ALT 18  ALKPHOS 44  BILITOT 0.8  PROT 5.7*  ALBUMIN 2.7*   Recent Labs  Lab 04/05/2020 0158  LIPASE 27   Recent Labs  Lab 04/09/20 1228  AMMONIA 15   CBC: Recent Labs  Lab 04/05/20 1305 04/07/20 0646 04/08/20 0428 04/10/20 0723  WBC 6.4 6.9 8.0 9.7  HGB 12.9* 12.6* 12.3* 12.5*  HCT 39.7 38.3* 37.5* 36.9*  MCV 93.9 93.0 93.5 91.6  PLT 298 229 175 63*   Cardiac Enzymes: No results for input(s): CKTOTAL, CKMB, CKMBINDEX, TROPONINI in the last 168 hours. D-Dimer No results for input(s): DDIMER in the last 72 hours. BNP: Invalid input(s): POCBNP CBG: No results for input(s): GLUCAP in the last 168 hours. Anemia work up No results for input(s): VITAMINB12, FOLATE, FERRITIN, TIBC, IRON, RETICCTPCT in the last 72 hours. Urinalysis    Component Value Date/Time   COLORURINE AMBER (A) 04/09/2020 1145   APPEARANCEUR TURBID (A) 04/09/2020 1145   APPEARANCEUR Cloudy (A) 10/26/2019 1120   LABSPEC 1.020 04/09/2020 1145   PHURINE 5.0 04/09/2020 1145   GLUCOSEU NEGATIVE 04/09/2020 1145   HGBUR LARGE (A) 04/09/2020 1145   BILIRUBINUR NEGATIVE 04/09/2020 1145   BILIRUBINUR Negative 10/26/2019 1120   KETONESUR NEGATIVE 04/09/2020 1145   PROTEINUR 100 (A) 04/09/2020 1145   NITRITE NEGATIVE 04/09/2020 1145   LEUKOCYTESUR TRACE (A) 04/09/2020 1145   Sepsis Labs Invalid input(s): PROCALCITONIN,  WBC,  LACTICIDVEN     SIGNED:  Nolberto Hanlon, MD  [facility] 05/03/20, 1:33 PM Pager   If 7PM-7AM, please contact night-coverage [email or web address] Password [email or web address]

## 2020-05-05 NOTE — Progress Notes (Signed)
2020/04/12 0825  Clinical Encounter Type  Visited With Family  Visit Type Initial;Death  Referral From Nurse  Consult/Referral To Chaplain  Chaplain responded to a page because pt was deceased. When chaplain arrived at room, she found pt's wife and daughter at bedside. Pt's daughter said he waited until I left. I had been here all night, and as soon as I went to get breakfast, came back I saw his carotid move once and that was it. Chaplain prayed with family. Pt's wife and daughter shared a lot of awesome stories about the pt and their family. Pt's wife said he was a hard working man and always worked two jobs to take care of his family. Because he worked 16 plus hours the wife primarily told care of the children. Pt and his wife have 4 children, 12 grandchildren, and 8 great-grandchildren. They are at such peace about pt's passing. The children talked yesterday about not resuscitating pt. Chaplain met all of the pt's children (4) and 1 grandson. Wife complimented chaplain in the care she gave family. Chaplain thanked family for allowing her to share with them during this time.

## 2020-05-05 NOTE — Treatment Plan (Addendum)
Patient pronounced by Mauri Pole and Cory Roughen at this time, 0808am, daughter and wife at bedside. Dr. Kurtis Bushman notified.   Update: Morphine 96.5 ml left in IV bag. Annabelle and Cory Roughen witnessed waste of this medication.   Patient cleansed and placed in proper bag at this time. Awaiting transport.

## 2020-05-05 DEATH — deceased

## 2021-06-06 IMAGING — CT CT HEAD W/O CM
3 series · 15 of 47 positions shown, 18 images · non-contrast
Comparison: Head CT, 04/08/2020

CLINICAL DATA: Continue confusion.  Stroke follow-up.

EXAM:
CT HEAD WITHOUT CONTRAST
TECHNIQUE: Contiguous axial images were obtained from the base of the skull
through the vertex without intravenous contrast.

[Series 2: head wo · axial · 0.45mm/px · z∈[+448,+588]mm · 9 of 34 slices shown, 12 images]
[im 3/34  brain]
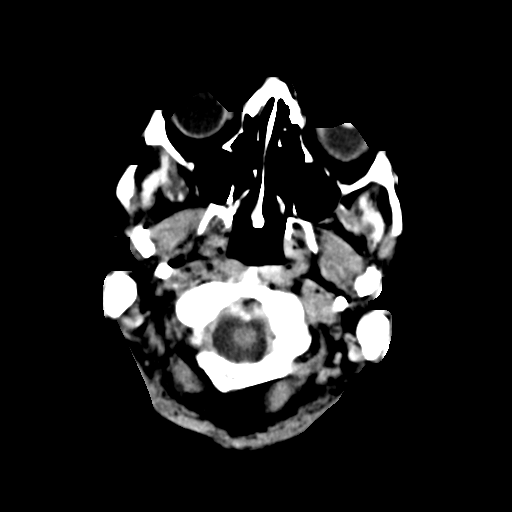
[im 3/34  bone]
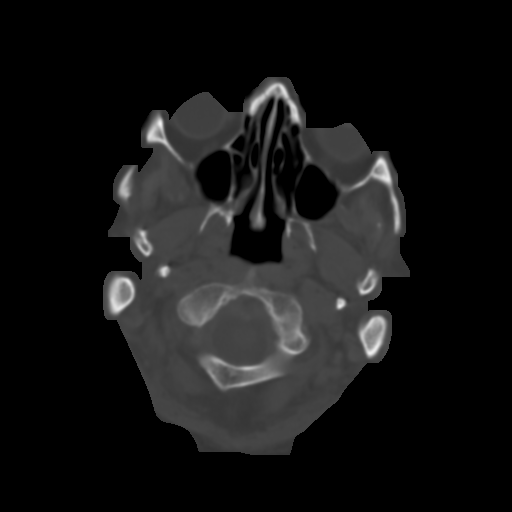
[im 6/34  brain]
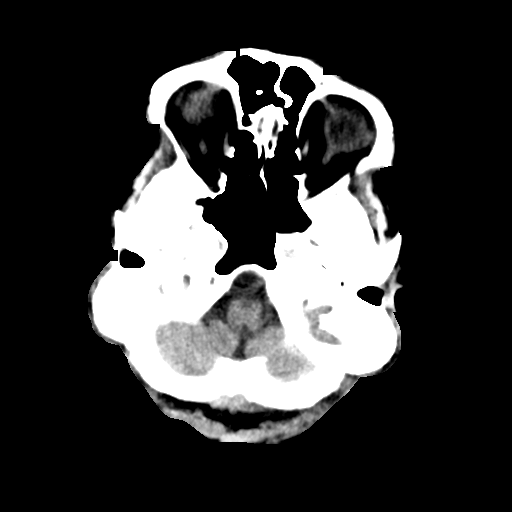
[im 10/34  brain]
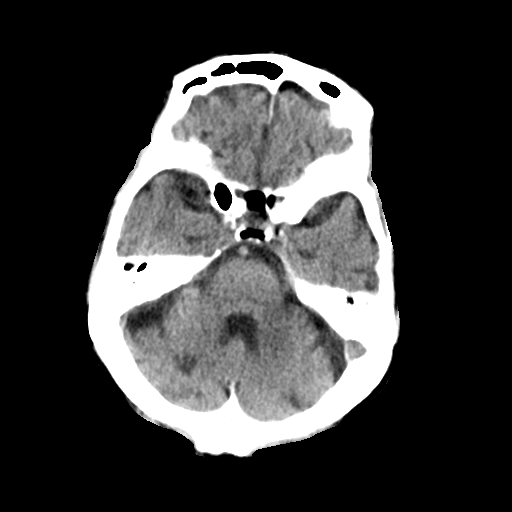
[im 13/34  brain]
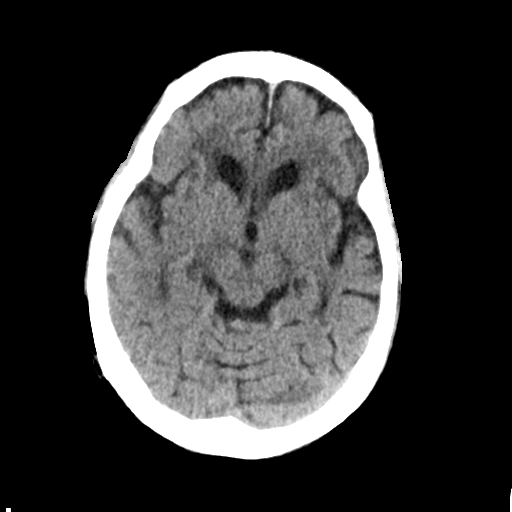
[im 18/34  brain]
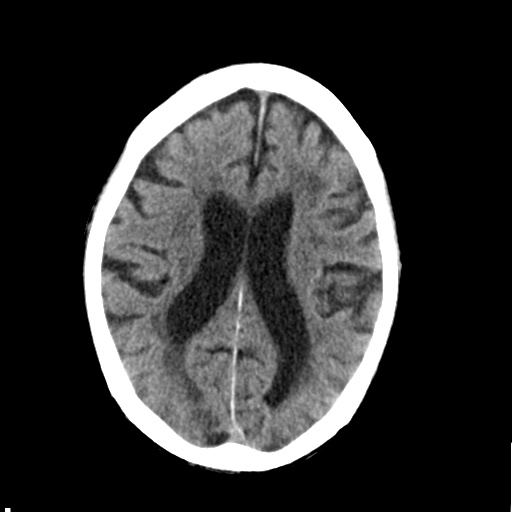
[im 18/34  bone]
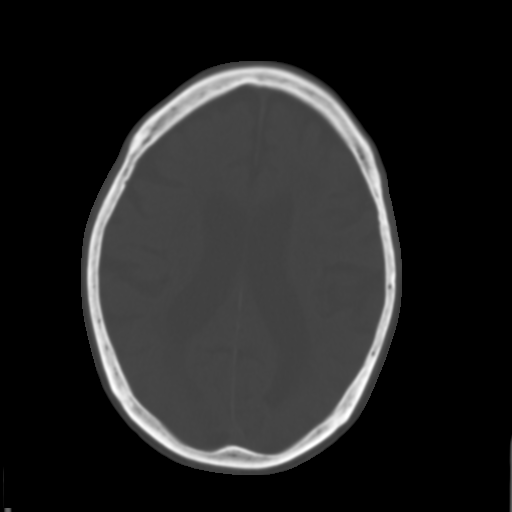
[im 21/34  brain]
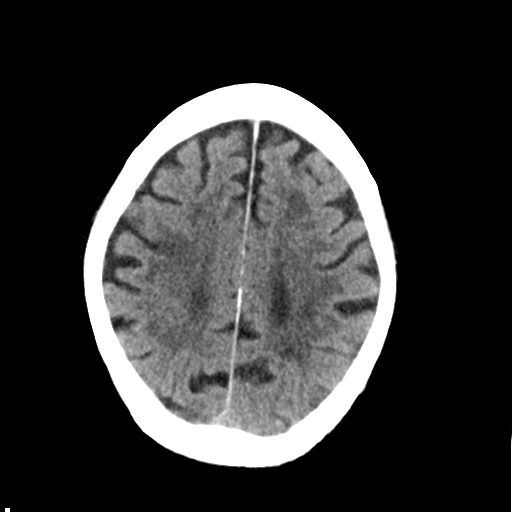
[im 24/34  brain]
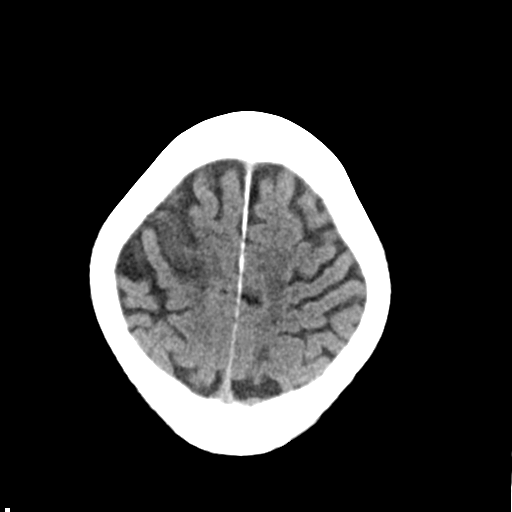
[im 28/34  brain]
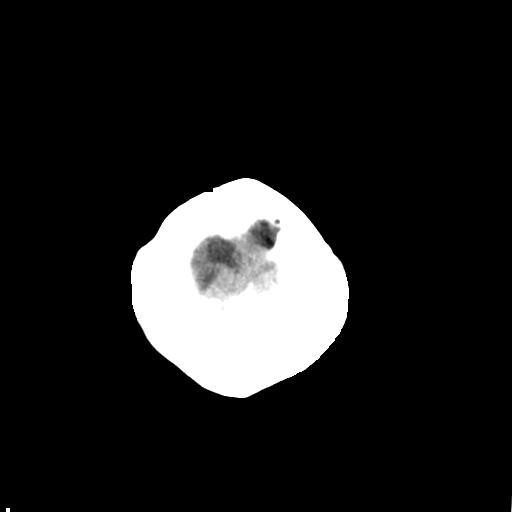
[im 31/34  brain]
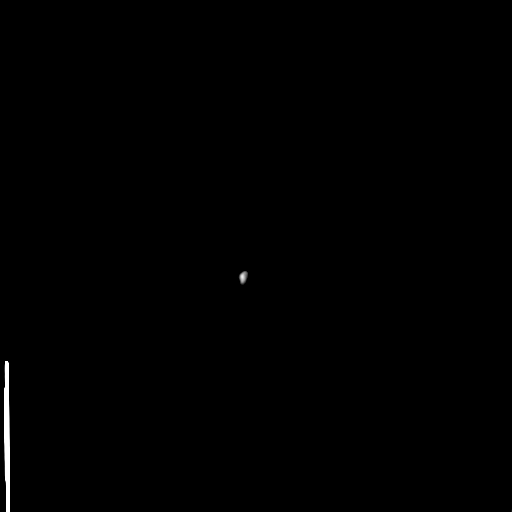
[im 31/34  bone]
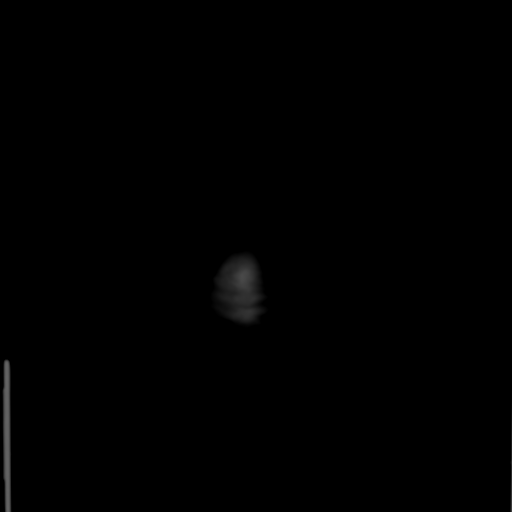

[Series 4: coronal soft tissue · coronal · 0.32mm/px · 3 of 70 slices shown]
[im 24/70  brain]
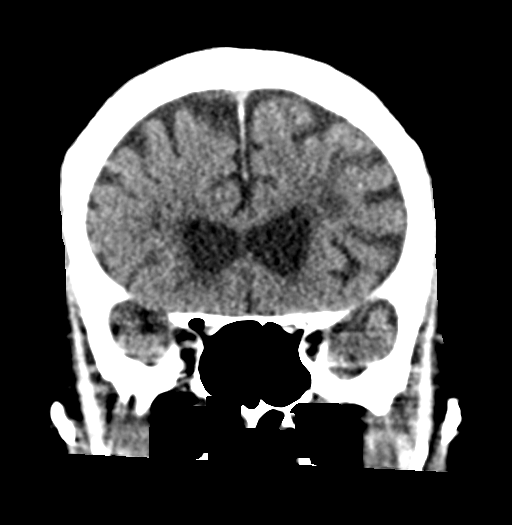
[im 31/70  brain]
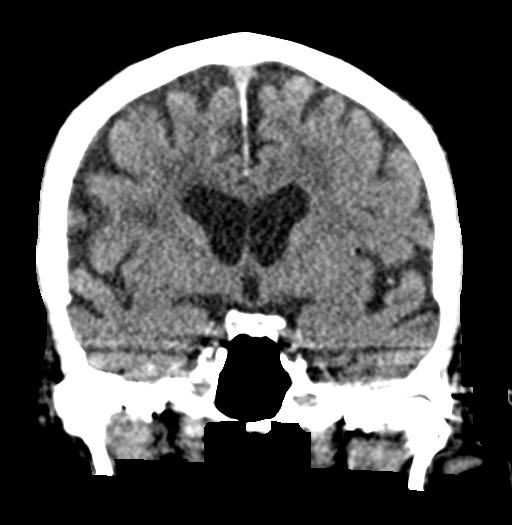
[im 39/70  brain]
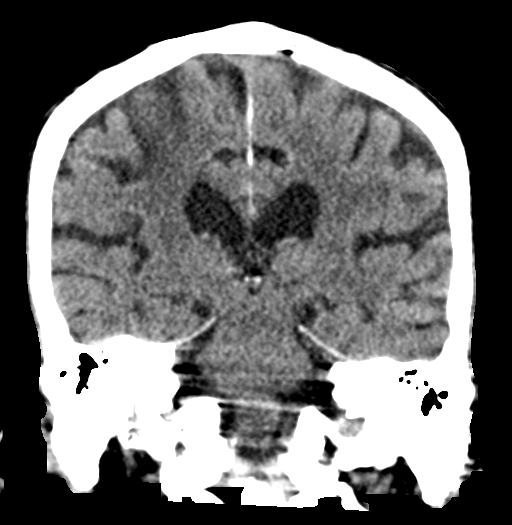

[Series 5: sagittal soft tissue · sagittal · 0.33mm/px · 3 of 55 slices shown]
[im 19/55  brain]
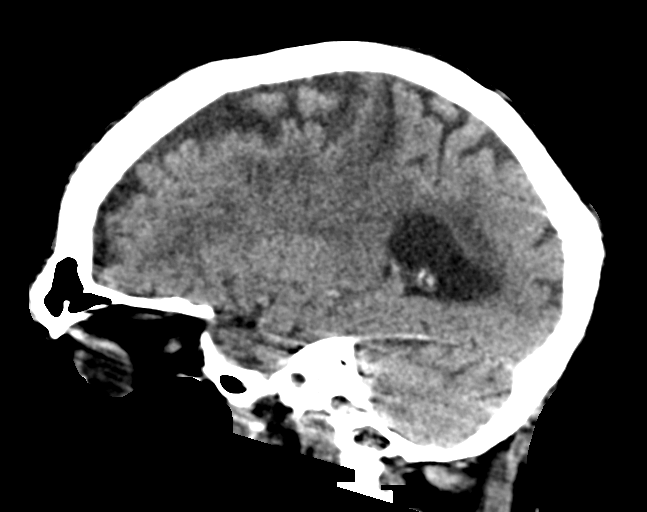
[im 28/55  brain]
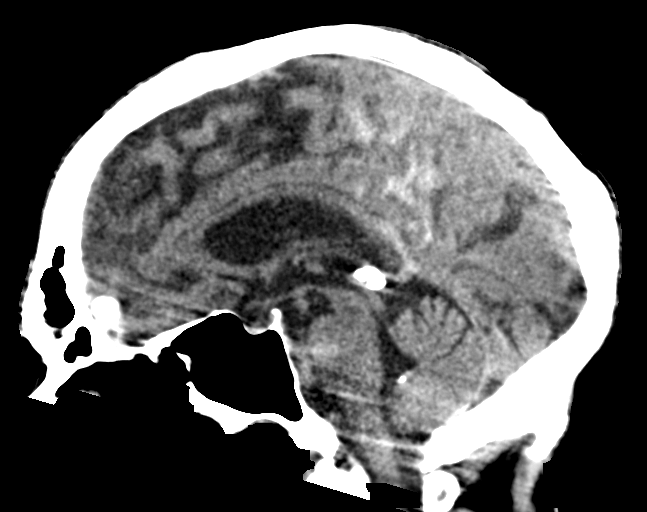
[im 37/55  brain]
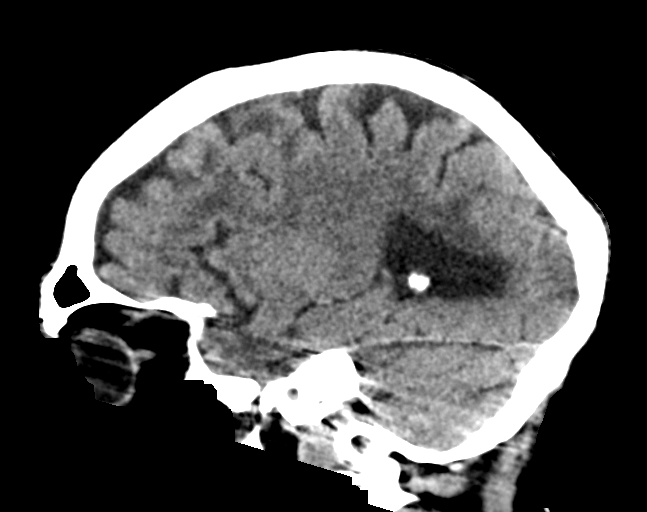

[15 of 47 positions shown; findings below may reference images not displayed]

FINDINGS: Brain: Small area of right posterior frontal lobe infarction is
unchanged from the previous day's exam.

There is also a small area of hypoattenuation involving the
gray-white junction of the posterior right occipital/parietal lobe.

No other evidence of recent infarction. No hydrocephalus and no
intracranial hemorrhage.

Ventricles and sulci are enlarged reflecting mild diffuse atrophy.
There are patchy areas of white matter hypoattenuation consistent
with chronic microvascular ischemic change. Small old cerebellar
infarcts.

Vascular: No hyperdense vessel or unexpected calcification.

Skull: Normal. Negative for fracture or focal lesion.

Sinuses/Orbits: No acute finding.

Other: None
IMPRESSION: 1. No interval change from the previous day's head CT. Small area of
right MCA distribution infarction along the right posterosuperior
frontal cortex. There is also a small area of possible recent
infarction involving the posterior right occipital/parietal lobe.
2. No new areas of infarction and no intracranial hemorrhage. No
hydrocephalus.
